# Patient Record
Sex: Male | Born: 1970 | Race: Black or African American | Hispanic: No | Marital: Married | Smoking: Never smoker
Health system: Southern US, Community
[De-identification: ages and names within clinical notes are randomized; demographics above are authoritative.]

## PROBLEM LIST (undated history)

## (undated) DIAGNOSIS — Z973 Presence of spectacles and contact lenses: Secondary | ICD-10-CM

## (undated) DIAGNOSIS — E119 Type 2 diabetes mellitus without complications: Secondary | ICD-10-CM

## (undated) DIAGNOSIS — D649 Anemia, unspecified: Secondary | ICD-10-CM

## (undated) DIAGNOSIS — N189 Chronic kidney disease, unspecified: Secondary | ICD-10-CM

## (undated) DIAGNOSIS — J189 Pneumonia, unspecified organism: Secondary | ICD-10-CM

## (undated) DIAGNOSIS — Z87442 Personal history of urinary calculi: Secondary | ICD-10-CM

## (undated) DIAGNOSIS — E785 Hyperlipidemia, unspecified: Secondary | ICD-10-CM

## (undated) DIAGNOSIS — R079 Chest pain, unspecified: Secondary | ICD-10-CM

## (undated) DIAGNOSIS — U071 COVID-19: Secondary | ICD-10-CM

## (undated) DIAGNOSIS — I10 Essential (primary) hypertension: Secondary | ICD-10-CM

## (undated) DIAGNOSIS — J45909 Unspecified asthma, uncomplicated: Secondary | ICD-10-CM

## (undated) HISTORY — PX: MULTIPLE TOOTH EXTRACTIONS: SHX2053

## (undated) HISTORY — PX: FOOT SURGERY: SHX648

## (undated) HISTORY — DX: Essential (primary) hypertension: I10

## (undated) HISTORY — DX: Chronic kidney disease, unspecified: N18.9

## (undated) HISTORY — PX: COLONOSCOPY W/ BIOPSIES AND POLYPECTOMY: SHX1376

## (undated) HISTORY — DX: Type 2 diabetes mellitus without complications: E11.9

## (undated) HISTORY — DX: Hyperlipidemia, unspecified: E78.5

## (undated) HISTORY — PX: EYE SURGERY: SHX253

## (undated) HISTORY — DX: Chest pain, unspecified: R07.9

---

## 1999-03-12 ENCOUNTER — Encounter: Admission: RE | Admit: 1999-03-12 | Discharge: 1999-06-10 | Payer: Self-pay | Admitting: Unknown Physician Specialty

## 2002-12-03 ENCOUNTER — Ambulatory Visit (HOSPITAL_BASED_OUTPATIENT_CLINIC_OR_DEPARTMENT_OTHER): Admission: RE | Admit: 2002-12-03 | Discharge: 2002-12-03 | Payer: Self-pay | Admitting: Urology

## 2002-12-03 ENCOUNTER — Encounter (INDEPENDENT_AMBULATORY_CARE_PROVIDER_SITE_OTHER): Payer: Self-pay | Admitting: Specialist

## 2004-08-15 ENCOUNTER — Emergency Department (HOSPITAL_COMMUNITY): Admission: EM | Admit: 2004-08-15 | Discharge: 2004-08-15 | Payer: Self-pay | Admitting: Emergency Medicine

## 2007-07-03 ENCOUNTER — Emergency Department (HOSPITAL_COMMUNITY): Admission: EM | Admit: 2007-07-03 | Discharge: 2007-07-03 | Payer: Self-pay | Admitting: Emergency Medicine

## 2008-05-08 ENCOUNTER — Ambulatory Visit: Payer: Self-pay | Admitting: Family Medicine

## 2008-05-13 ENCOUNTER — Ambulatory Visit: Payer: Self-pay | Admitting: *Deleted

## 2008-06-27 ENCOUNTER — Ambulatory Visit: Payer: Self-pay | Admitting: Family Medicine

## 2008-06-27 LAB — CONVERTED CEMR LAB
AST: 28 units/L (ref 0–37)
Alkaline Phosphatase: 55 units/L (ref 39–117)
CO2: 26 meq/L (ref 19–32)
Calcium: 9.8 mg/dL (ref 8.4–10.5)
Chloride: 102 meq/L (ref 96–112)
Cholesterol: 188 mg/dL (ref 0–200)
Creatinine, Ser: 1.05 mg/dL (ref 0.40–1.50)
Glucose, Bld: 93 mg/dL (ref 70–99)
HCT: 43 % (ref 39.0–52.0)
Hemoglobin: 14.3 g/dL (ref 13.0–17.0)
LDL Cholesterol: 123 mg/dL — ABNORMAL HIGH (ref 0–99)
Lymphs Abs: 3.1 10*3/uL (ref 0.7–4.0)
MCV: 74.4 fL — ABNORMAL LOW (ref 78.0–100.0)
Microalb, Ur: 10.3 mg/dL — ABNORMAL HIGH (ref 0.00–1.89)
Monocytes Absolute: 0.9 10*3/uL (ref 0.1–1.0)
Neutrophils Relative %: 41 % — ABNORMAL LOW (ref 43–77)
RBC: 5.78 M/uL (ref 4.22–5.81)
RDW: 13.8 % (ref 11.5–15.5)
Total Protein: 8.1 g/dL (ref 6.0–8.3)
Triglycerides: 88 mg/dL (ref <150)
VLDL: 18 mg/dL (ref 0–40)
WBC: 6.8 10*3/uL (ref 4.0–10.5)

## 2008-07-03 ENCOUNTER — Ambulatory Visit: Payer: Self-pay | Admitting: Internal Medicine

## 2008-07-19 ENCOUNTER — Encounter: Payer: Self-pay | Admitting: Family Medicine

## 2008-07-19 LAB — CONVERTED CEMR LAB
Collection Interval-CRCL: 24 hr
Creatinine, Urine: 159.8 mg/dL

## 2009-02-22 ENCOUNTER — Emergency Department (HOSPITAL_COMMUNITY): Admission: EM | Admit: 2009-02-22 | Discharge: 2009-02-22 | Payer: Self-pay | Admitting: Emergency Medicine

## 2009-03-05 ENCOUNTER — Emergency Department (HOSPITAL_COMMUNITY): Admission: EM | Admit: 2009-03-05 | Discharge: 2009-03-05 | Payer: Self-pay | Admitting: Family Medicine

## 2009-10-31 ENCOUNTER — Emergency Department (HOSPITAL_COMMUNITY): Admission: EM | Admit: 2009-10-31 | Discharge: 2009-10-31 | Payer: Self-pay | Admitting: Emergency Medicine

## 2009-11-03 ENCOUNTER — Emergency Department (HOSPITAL_COMMUNITY): Admission: EM | Admit: 2009-11-03 | Discharge: 2009-11-03 | Payer: Self-pay | Admitting: Family Medicine

## 2010-02-05 ENCOUNTER — Emergency Department (HOSPITAL_COMMUNITY): Admission: EM | Admit: 2010-02-05 | Discharge: 2010-02-05 | Payer: Self-pay | Admitting: Emergency Medicine

## 2010-11-02 ENCOUNTER — Other Ambulatory Visit: Payer: Self-pay | Admitting: Podiatry

## 2010-11-02 DIAGNOSIS — M79673 Pain in unspecified foot: Secondary | ICD-10-CM

## 2010-11-02 DIAGNOSIS — M25579 Pain in unspecified ankle and joints of unspecified foot: Secondary | ICD-10-CM

## 2010-11-06 ENCOUNTER — Inpatient Hospital Stay: Admission: RE | Admit: 2010-11-06 | Payer: Self-pay | Source: Ambulatory Visit

## 2010-11-06 ENCOUNTER — Ambulatory Visit
Admission: RE | Admit: 2010-11-06 | Discharge: 2010-11-06 | Disposition: A | Discharge status: Home / Self Care | Payer: No Typology Code available for payment source | Source: Ambulatory Visit | Attending: Podiatry | Admitting: Podiatry

## 2010-11-06 DIAGNOSIS — M25579 Pain in unspecified ankle and joints of unspecified foot: Secondary | ICD-10-CM

## 2010-11-06 DIAGNOSIS — M79673 Pain in unspecified foot: Secondary | ICD-10-CM

## 2011-02-05 NOTE — Op Note (Signed)
   NAMEROSEVELT, FLATLEY                         ACCOUNT NO.:  [id number]   MEDICAL RECORD NO.:  [id number]                   PATIENT TYPE:  AMB   LOCATION:  [facility]                                 FACILITY:  [facility]   PHYSICIAN:  Lillette Boxer. Dahlstedt, M.D.          DATE OF BIRTH:  02-08-1971   DATE OF PROCEDURE:  12/03/2002  DATE OF DISCHARGE:                                 OPERATIVE REPORT   PREOPERATIVE DIAGNOSIS:  Phimosis.   POSTOPERATIVE DIAGNOSIS:  Phimosis.   PRINCIPAL PROCEDURE:  Circumcision.   SURGEON:  Lillette Boxer. Dahlstedt, M.D.   ANESTHESIA:  Local with MAC.   COMPLICATIONS:  None.   BRIEF HISTORY:  A 40 year old male with recurrent phimosis and balanitis.  He presents at this time for circumcision, being aware of the alternatives  as well as risks and complications.  He desires to proceed.   DESCRIPTION OF PROCEDURE:  The patient was administered monitored anesthesia  care, and his genitalia and perineum were prepped and draped.  A dorsal  penile block was performed with 50/50 solution of 1% plain lidocaine and  0.5% plain Marcaine.  Circumcising incisions were made in the proximal and  distal foreskin after appropriate marking was performed.  The foreskin was  excised.  Small bleeders were coagulated with electrocautery, especially a  small artery at the frenulum.  A U-stitch was placed at the frenulum  approximating the distal penile skin.  Quadrant sutures were then placed  using the same 3-0 chromic.  Running simple suture of the 3-0 chromic was  placed in between all quadrant sutures.  Hemostasis was excellent.  Dressings included Vaseline gauze, Kerlix, and Coban.  The procedure was  terminated.  The patient was taken to the PACU in stable condition.                                               Lillette Boxer. Dahlstedt, M.D.    SMD/MEDQ  D:  12/03/2002  T:  12/03/2002  Job:  [id number]   cc:   Bernerd Limbo, M.D.  [place]  [place] [address]  Fax:  [phone]

## 2011-06-02 ENCOUNTER — Inpatient Hospital Stay (INDEPENDENT_AMBULATORY_CARE_PROVIDER_SITE_OTHER)
Admission: RE | Admit: 2011-06-02 | Discharge: 2011-06-02 | Disposition: A | Discharge status: Home / Self Care | Payer: Self-pay | Source: Ambulatory Visit | Attending: Family Medicine | Admitting: Family Medicine

## 2011-06-02 DIAGNOSIS — S61209A Unspecified open wound of unspecified finger without damage to nail, initial encounter: Secondary | ICD-10-CM

## 2011-06-02 DIAGNOSIS — I10 Essential (primary) hypertension: Secondary | ICD-10-CM

## 2011-06-02 LAB — POCT I-STAT, CHEM 8
Calcium, Ion: 1.21 mmol/L (ref 1.12–1.32)
Creatinine, Ser: 1.3 mg/dL (ref 0.50–1.35)
Glucose, Bld: 109 mg/dL — ABNORMAL HIGH (ref 70–99)
HCT: 44 % (ref 39.0–52.0)
Sodium: 142 mEq/L (ref 135–145)

## 2011-06-07 ENCOUNTER — Inpatient Hospital Stay (HOSPITAL_COMMUNITY)
Admission: RE | Admit: 2011-06-07 | Discharge: 2011-06-07 | Disposition: A | Discharge status: Home / Self Care | Payer: Self-pay | Source: Ambulatory Visit | Attending: Family Medicine | Admitting: Family Medicine

## 2011-06-14 ENCOUNTER — Inpatient Hospital Stay (INDEPENDENT_AMBULATORY_CARE_PROVIDER_SITE_OTHER)
Admission: RE | Admit: 2011-06-14 | Discharge: 2011-06-14 | Disposition: A | Discharge status: Home / Self Care | Payer: Self-pay | Source: Ambulatory Visit | Attending: Family Medicine | Admitting: Family Medicine

## 2011-06-14 DIAGNOSIS — Z4802 Encounter for removal of sutures: Secondary | ICD-10-CM

## 2011-06-14 DIAGNOSIS — I10 Essential (primary) hypertension: Secondary | ICD-10-CM

## 2011-07-01 LAB — DIFFERENTIAL
Basophils Absolute: 0.1
Eosinophils Relative: 3
Lymphocytes Relative: 34
Lymphs Abs: 2.6
Monocytes Relative: 10
Neutrophils Relative %: 52

## 2011-07-01 LAB — POCT CARDIAC MARKERS
CKMB, poc: 5.1
Myoglobin, poc: 364
Operator id: 294511
Operator id: 294511
Troponin i, poc: <0.05

## 2011-07-01 LAB — I-STAT 8, (EC8 V) (CONVERTED LAB)
BUN: 13
Chloride: 99
HCT: 43
Potassium: 3.5
Sodium: 135
TCO2: 29
pH, Ven: 7.375 — ABNORMAL HIGH

## 2011-07-01 LAB — CBC
MCHC: 33.8
Platelets: 252
RBC: 5.08

## 2011-07-01 LAB — POCT I-STAT CREATININE: Operator id: 294511

## 2013-11-07 ENCOUNTER — Ambulatory Visit (INDEPENDENT_AMBULATORY_CARE_PROVIDER_SITE_OTHER): Payer: BC Managed Care – PPO | Admitting: Cardiology

## 2013-11-07 ENCOUNTER — Encounter: Payer: Self-pay | Admitting: *Deleted

## 2013-11-07 VITALS — BP 206/110 | HR 72 | Ht 70.0 in | Wt 302.8 lb

## 2013-11-07 DIAGNOSIS — R079 Chest pain, unspecified: Secondary | ICD-10-CM | POA: Insufficient documentation

## 2013-11-07 DIAGNOSIS — E119 Type 2 diabetes mellitus without complications: Secondary | ICD-10-CM | POA: Insufficient documentation

## 2013-11-07 DIAGNOSIS — I10 Essential (primary) hypertension: Secondary | ICD-10-CM | POA: Insufficient documentation

## 2013-11-07 NOTE — Progress Notes (Signed)
HPI  the patient presents for evaluation of chest discomfort and difficult to control hypertension. He has no prior cardiac history. However, he has significant untreated risk factors. He recently obtained insurance and is now following up for treatment of diabetes, hyperlipidemia and hypertension. He was recently started on the medications as listed below. He does report some chest discomfort. This was happening for about a week a few days ago. He points to his left upper chest. It was all day 2/10 discomfort. It did not radiate. It did not fluctuate and was not with activities. There were no associated symptoms. He was somewhat dull. It finally just went away on its own. He was not exercising during this time but he typically exercises significantly without bringing on any of the symptoms. He has had difficult to control hypertension. His hemoglobin A1c was just checked and was greater than 10. He had been treated for these issues in the past but because of insurance problems came off of his medicines.  No Known Allergies  Current Outpatient Prescriptions  Medication Sig Dispense Refill  . atorvastatin (LIPITOR) 20 MG tablet Take 20 mg by mouth daily.      . metFORMIN (GLUCOPHAGE) 500 MG tablet Take by mouth 2 (two) times daily with a meal.      . Olmesartan-Amlodipine-HCTZ (TRIBENZOR) 40-10-12.5 MG TABS Take by mouth.       No current facility-administered medications for this visit.    Past Medical History  Diagnosis Date  . HTN (hypertension)   . Type II or unspecified type diabetes mellitus without mention of complication, not stated as uncontrolled   . Chest pain     Past Surgical History  Procedure Laterality Date  . Foot surgery    . Circumcision, non-newborn  2004    Family History  Problem Relation Age of Onset  . Colon cancer Father   . Diabetes      family history  . Hypertension      family history    History   Social History  . Marital Status: Single   Spouse Name: N/A    Number of Children: N/A  . Years of Education: N/A   Occupational History  . Not on file.   Social History Main Topics  . Smoking status: Never Smoker   . Smokeless tobacco: Not on file  . Alcohol Use: Yes     Comment: occasionally  . Drug Use: No  . Sexual Activity: Not on file   Other Topics Concern  . Not on file   Social History Narrative  . No narrative on file    ROS:  .As stated in the HPI and negative for all other systems.  PHYSICAL EXAM BP 206/110  Pulse 72  Ht 5\' 10"  (1.778 m)  Wt 302 lb 12.8 oz (137.349 kg)  BMI 43.45 kg/m2 GENERAL:  Well appearing HEENT:  Pupils equal round and reactive, fundi not visualized, oral mucosa unremarkable NECK:  No jugular venous distention, waveform within normal limits, carotid upstroke brisk and symmetric, no bruits, no thyromegaly LYMPHATICS:  No cervical, inguinal adenopathy LUNGS:  Clear to auscultation bilaterally BACK:  No CVA tenderness CHEST:  Unremarkable HEART:  PMI not displaced or sustained,S1 and S2 within normal limits, no S3, no S4, no clicks, no rubs, no murmurs ABD:  Flat, positive bowel sounds normal in frequency in pitch, no bruits, no rebound, no guarding, no midline pulsatile mass, no hepatomegaly, no splenomegaly EXT:  2 plus pulses throughout, no edema, no  cyanosis no clubbing SKIN:  No rashes no nodules NEURO:  Cranial nerves II through XII grossly intact, motor grossly intact throughout PSYCH:  Cognitively intact, oriented to person place and time  EKG:   Sinus rhythm, rate 54 , axis within normal limits, intervals within normal limits, minimal voltage criteria for left ventricle hypertrophy with repolarization changes. 11/07/2013   ASSESSMENT AND PLAN  HTN:  He was just started on Tribenzor which I think is an excellent choice.  I would not make another change at this time.  However, I thinks that he will need probably another one or two medications.  I might suggest  spironolactone as the next add on.  I did discuss diet (salt restriction) and weight loss as therapeutic lifestyle changes.  I do not strongly suspect a secondary etiology.  However, I will check renal duplex.  I would be happy to consult with Dr. Moreen Fowler as he continues to titrate the meds.    CHEST PAIN:  This is atypical.  Given  His risk factors I would eventually perform an exercise treadmill test although I think the pretest probability of obstructive coronary disease is low.  However, I would not want to do this until his BP is better controlled.   DYSLIPIDEMIA:  I agree with the Lipitor which was started.

## 2013-11-07 NOTE — Patient Instructions (Signed)
The current medical regimen is effective;  continue present plan and medications.  Your physician has requested that you have a renal artery duplex. During this test, an ultrasound is used to evaluate blood flow to the kidneys. Allow one hour for this exam. Do not eat after midnight the day before and avoid carbonated beverages. Take your medications as you usually do.  Follow up in 3 months with Dr Minus Breeding.

## 2013-11-19 ENCOUNTER — Ambulatory Visit
Admission: RE | Admit: 2013-11-19 | Discharge: 2013-11-19 | Disposition: A | Discharge status: Home / Self Care | Payer: BC Managed Care – PPO | Source: Ambulatory Visit | Attending: Family Medicine | Admitting: Family Medicine

## 2013-11-19 ENCOUNTER — Other Ambulatory Visit: Payer: Self-pay | Admitting: Family Medicine

## 2013-11-19 DIAGNOSIS — J069 Acute upper respiratory infection, unspecified: Secondary | ICD-10-CM

## 2013-12-03 ENCOUNTER — Encounter (HOSPITAL_COMMUNITY): Payer: BC Managed Care – PPO

## 2013-12-04 ENCOUNTER — Encounter (HOSPITAL_COMMUNITY): Payer: Self-pay | Admitting: Cardiology

## 2014-01-29 ENCOUNTER — Encounter: Payer: Self-pay | Admitting: *Deleted

## 2014-02-04 ENCOUNTER — Ambulatory Visit: Payer: BC Managed Care – PPO | Admitting: Cardiology

## 2014-03-18 ENCOUNTER — Other Ambulatory Visit: Payer: Self-pay | Admitting: Gastroenterology

## 2015-01-20 ENCOUNTER — Encounter (INDEPENDENT_AMBULATORY_CARE_PROVIDER_SITE_OTHER): Payer: BLUE CROSS/BLUE SHIELD | Admitting: Ophthalmology

## 2015-01-20 DIAGNOSIS — H35033 Hypertensive retinopathy, bilateral: Secondary | ICD-10-CM

## 2015-01-20 DIAGNOSIS — H2513 Age-related nuclear cataract, bilateral: Secondary | ICD-10-CM | POA: Diagnosis not present

## 2015-01-20 DIAGNOSIS — E10319 Type 1 diabetes mellitus with unspecified diabetic retinopathy without macular edema: Secondary | ICD-10-CM | POA: Diagnosis not present

## 2015-01-20 DIAGNOSIS — E10359 Type 1 diabetes mellitus with proliferative diabetic retinopathy without macular edema: Secondary | ICD-10-CM | POA: Diagnosis not present

## 2015-01-20 DIAGNOSIS — H43813 Vitreous degeneration, bilateral: Secondary | ICD-10-CM | POA: Diagnosis not present

## 2015-01-20 DIAGNOSIS — H4312 Vitreous hemorrhage, left eye: Secondary | ICD-10-CM | POA: Diagnosis not present

## 2015-01-20 DIAGNOSIS — I10 Essential (primary) hypertension: Secondary | ICD-10-CM | POA: Diagnosis not present

## 2015-02-26 ENCOUNTER — Encounter (INDEPENDENT_AMBULATORY_CARE_PROVIDER_SITE_OTHER): Payer: BLUE CROSS/BLUE SHIELD | Admitting: Ophthalmology

## 2015-03-03 ENCOUNTER — Encounter (INDEPENDENT_AMBULATORY_CARE_PROVIDER_SITE_OTHER): Payer: BLUE CROSS/BLUE SHIELD | Admitting: Ophthalmology

## 2015-03-03 DIAGNOSIS — H4311 Vitreous hemorrhage, right eye: Secondary | ICD-10-CM

## 2015-07-07 ENCOUNTER — Ambulatory Visit (INDEPENDENT_AMBULATORY_CARE_PROVIDER_SITE_OTHER): Payer: BLUE CROSS/BLUE SHIELD | Admitting: Ophthalmology

## 2015-07-07 DIAGNOSIS — E103591 Type 1 diabetes mellitus with proliferative diabetic retinopathy without macular edema, right eye: Secondary | ICD-10-CM

## 2015-07-07 DIAGNOSIS — I10 Essential (primary) hypertension: Secondary | ICD-10-CM

## 2015-07-07 DIAGNOSIS — H2513 Age-related nuclear cataract, bilateral: Secondary | ICD-10-CM | POA: Diagnosis not present

## 2015-07-07 DIAGNOSIS — H35033 Hypertensive retinopathy, bilateral: Secondary | ICD-10-CM | POA: Diagnosis not present

## 2015-07-07 DIAGNOSIS — E10311 Type 1 diabetes mellitus with unspecified diabetic retinopathy with macular edema: Secondary | ICD-10-CM | POA: Diagnosis not present

## 2015-07-07 DIAGNOSIS — H4312 Vitreous hemorrhage, left eye: Secondary | ICD-10-CM

## 2015-07-07 DIAGNOSIS — E103512 Type 1 diabetes mellitus with proliferative diabetic retinopathy with macular edema, left eye: Secondary | ICD-10-CM

## 2015-07-07 NOTE — H&P (Signed)
Ray Bradley is an 44 y.o. male.   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 HTN (hypertension)   . Type II or unspecified type diabetes mellitus without mention of complication, not stated as uncontrolled   . Chest pain   . Hyperlipidemia     Past Surgical History  Procedure Laterality Date  . Foot surgery    . Circumcision, non-newborn  2004    Family History  Problem Relation Age of Onset  . Colon cancer Father   . Diabetes      family history  . Hypertension      family history  . Aneurysm Mother     died age 30, brain  . Hypertension Mother   . Hypertension Sister 41  . Hypertension Sister 48  . Hypertension Brother 65   Social History:  reports that he has never smoked. He does not have any smokeless tobacco history on file. He reports that he drinks alcohol. He reports that he does not use illicit drugs.  Allergies: No Known Allergies  No prescriptions prior to admission    Review of systems otherwise negative  There were no vitals taken for this visit.  Physical exam: Mental status: oriented x3. Eyes: See eye exam associated with this date of surgery in media tab.  Scanned in by scanning center Ears, Nose, Throat: within normal limits Neck: Within Normal limits General: within normal limits Chest: Within normal limits Breast: deferred Heart: Within normal limits Abdomen: Within normal limits GU: deferred Extremities: within normal limits Skin: within normal limits  Assessment/Plan Vitreous hemorrhage, proliferative diabetic retinopathy left eye Plan: To [facility] for Pars plana vitrectomy, laser, gas injection left eye.  Hayden Pedro 07/07/2015, 5:56 PM

## 2015-07-15 ENCOUNTER — Ambulatory Visit (HOSPITAL_COMMUNITY): Admission: RE | Admit: 2015-07-15 | Payer: Self-pay | Source: Ambulatory Visit | Admitting: Ophthalmology

## 2015-07-15 ENCOUNTER — Encounter (HOSPITAL_COMMUNITY): Admission: RE | Payer: Self-pay | Source: Ambulatory Visit

## 2015-07-15 SURGERY — PARS PLANA VITRECTOMY WITH 25 GAUGE
Anesthesia: General | Laterality: Left

## 2015-07-22 ENCOUNTER — Encounter (INDEPENDENT_AMBULATORY_CARE_PROVIDER_SITE_OTHER): Payer: BLUE CROSS/BLUE SHIELD | Admitting: Ophthalmology

## 2015-12-26 ENCOUNTER — Ambulatory Visit: Payer: BLUE CROSS/BLUE SHIELD | Admitting: Podiatry

## 2016-01-09 ENCOUNTER — Encounter: Payer: Self-pay | Admitting: Podiatry

## 2016-01-09 ENCOUNTER — Ambulatory Visit (INDEPENDENT_AMBULATORY_CARE_PROVIDER_SITE_OTHER): Payer: BLUE CROSS/BLUE SHIELD

## 2016-01-09 ENCOUNTER — Ambulatory Visit (INDEPENDENT_AMBULATORY_CARE_PROVIDER_SITE_OTHER): Payer: BLUE CROSS/BLUE SHIELD | Admitting: Podiatry

## 2016-01-09 DIAGNOSIS — B078 Other viral warts: Secondary | ICD-10-CM

## 2016-01-09 DIAGNOSIS — B079 Viral wart, unspecified: Secondary | ICD-10-CM

## 2016-01-09 DIAGNOSIS — M79672 Pain in left foot: Secondary | ICD-10-CM

## 2016-01-09 DIAGNOSIS — E119 Type 2 diabetes mellitus without complications: Secondary | ICD-10-CM

## 2016-01-09 DIAGNOSIS — Z0189 Encounter for other specified special examinations: Secondary | ICD-10-CM

## 2016-01-09 NOTE — Progress Notes (Signed)
   Subjective:    Patient ID: Ray Bradley, male    DOB: 06-11-1971, 45 y.o.   MRN: [id number]  HPI  N-THICK SKIN L-LT FOOT GREAT TOE D-2 MONTHS O-SLOWLY C-WORSE A-PRESSURE T- TRIM  Review of Systems  Skin: Positive for color change.       Objective:   Physical Exam        Assessment & Plan:

## 2016-01-11 NOTE — Progress Notes (Signed)
Subjective:     Patient ID: Ray Bradley, male   DOB: [birth year], 45 y.o.   MRN: [id number]  HPI patient presents stating he has a lesion on the side of his left big toe which is occurred over the last couple months and it's been getting gradually worse and painful when he walks on it or wear shoes gear   Review of Systems  All other systems reviewed and are negative.      Objective:   Physical Exam  Constitutional: He is oriented to person, place, and time.  Cardiovascular: Intact distal pulses.   Musculoskeletal: Normal range of motion.  Neurological: He is oriented to person, place, and time.  Skin: Skin is warm and dry.  Nursing note and vitals reviewed.  neurovascular status was found to be intact muscle strength was adequate range of motion within normal limits with patient having mild diminishment of sharp Dole vibratory. On the left hallux medial side upon debridement there is a keratotic lesion that is defined with small pinpoint black dot and pain to lateral pressure. It is localized to this area and there is keratotic tissue surrounding this     Assessment:     Probable verruca plantaris of the left hallux medial side that is localized in nature    Plan:     H&P and condition reviewed with patient. I do believe this is most likely verruca and I discussed chemical versus excision and we are going to try chemical. I debrided the area fully and then applied a chemical agent to create a immune response and applied sterile dressing. I gave instructions on soaks and what to do if any blistering were to occur and reappoint again in 1 month or if any issues should occur

## 2016-09-30 ENCOUNTER — Encounter: Payer: Self-pay | Admitting: Podiatry

## 2016-09-30 ENCOUNTER — Ambulatory Visit (INDEPENDENT_AMBULATORY_CARE_PROVIDER_SITE_OTHER): Payer: Self-pay | Admitting: Podiatry

## 2016-09-30 ENCOUNTER — Ambulatory Visit (INDEPENDENT_AMBULATORY_CARE_PROVIDER_SITE_OTHER): Payer: Self-pay

## 2016-09-30 VITALS — BP 160/95 | HR 73 | Resp 16

## 2016-09-30 DIAGNOSIS — L03119 Cellulitis of unspecified part of limb: Secondary | ICD-10-CM

## 2016-09-30 DIAGNOSIS — S99921A Unspecified injury of right foot, initial encounter: Secondary | ICD-10-CM

## 2016-09-30 DIAGNOSIS — L02619 Cutaneous abscess of unspecified foot: Secondary | ICD-10-CM

## 2016-09-30 LAB — CBC WITH DIFFERENTIAL/PLATELET
BASOS PCT: 0 %
Basophils Absolute: 0 cells/uL (ref 0–200)
Eosinophils Absolute: 225 cells/uL (ref 15–500)
Eosinophils Relative: 3 %
HEMATOCRIT: 37 % — AB (ref 38.5–50.0)
HEMOGLOBIN: 12.7 g/dL — AB (ref 13.2–17.1)
LYMPHS ABS: 2475 {cells}/uL (ref 850–3900)
LYMPHS PCT: 33 %
MCH: 26.2 pg — ABNORMAL LOW (ref 27.0–33.0)
MCHC: 34.3 g/dL (ref 32.0–36.0)
MCV: 76.4 fL — ABNORMAL LOW (ref 80.0–100.0)
MONO ABS: 750 {cells}/uL (ref 200–950)
MPV: 9.7 fL (ref 7.5–12.5)
Monocytes Relative: 10 %
NEUTROS ABS: 4050 {cells}/uL (ref 1500–7800)
Neutrophils Relative %: 54 %
Platelets: 311 10*3/uL (ref 140–400)
RBC: 4.84 MIL/uL (ref 4.20–5.80)
RDW: 14.1 % (ref 11.0–15.0)
WBC: 7.5 10*3/uL (ref 3.8–10.8)

## 2016-09-30 MED ORDER — AMOXICILLIN-POT CLAVULANATE 875-125 MG PO TABS: 1.0000 | ORAL_TABLET | Freq: Two times a day (BID) | ORAL | 0 refills | Status: DC

## 2016-10-01 LAB — SEDIMENTATION RATE: Sed Rate: 34 mm/hr — ABNORMAL HIGH (ref 0–15)

## 2016-10-03 LAB — WOUND CULTURE
GRAM STAIN: NONE SEEN
Gram Stain: NONE SEEN
Gram Stain: NONE SEEN
Organism ID, Bacteria: NO GROWTH

## 2016-10-03 NOTE — Progress Notes (Signed)
Subjective:     Patient ID: Ray Bradley, male   DOB: [birth year], 46 y.o.   MRN: [id number]  HPI patient states he stepped on a nail proximally 6 days ago and walked on it all day before realizing it and pulling it out at the end of the day. It did penetrate his work boot even though he says to a very small amount and there was no significant bleeding when the event occurred   Review of Systems  All other systems reviewed and are negative.      Objective:   Physical Exam  Constitutional: He is oriented to person, place, and time.  Cardiovascular: Intact distal pulses.   Musculoskeletal: Normal range of motion.  Neurological: He is oriented to person, place, and time.  Skin: Skin is warm.  Nursing note and vitals reviewed.  neurovascular status was found to be intact with mild diminishment of sharp dull vibratory. Patient does have long-term diabetes that's under good control stating that when checked this morning his number was 110. He's had no fever chills or systemic signs of infection when questioned and on the plantar aspect of the right foot there is a small opening measuring approximately 3 x 3 mm with no odor emitting from it and no active drainage. Patient has no proximal edema erythema drainage at this current time and is taking Cipro which was dispensed to him at the urgent care where he was first evaluated     Assessment:     Puncture wound right with possibility for deep infection with no current indications of systemic infection    Plan:     H&P condition reviewed with patient at great length. At this point I went ahead and utilizing sterile instrumentation I did debride the area and I did not note active drainage. I did do a probe with a culture and was able to probe several millimeters with no indication of deep fascia or tendon bone involvement and I did culture this and send it off to pathology. I then applied dressing to the area with padding to relieve any  weightbearing pressure and dispensed surgical shoe and instructed on soaks and added Augmentin 875 twice a day for antibiotic. I gave him strict instructions that if any changes should occur or any systemic indications of infection were to occur he is to contact our office immediately and go to the emergency room. I did discuss with him with these types of infections there is a possibility for a deeper infection and that this could eventually require hospitalization and other treatments. Patient is scheduled to be seen back in 1 week or earlier if needed  X-ray report is not indicate signs currently of bone infection or soft tissue abscess

## 2016-10-07 ENCOUNTER — Ambulatory Visit: Payer: Self-pay | Admitting: Podiatry

## 2016-10-19 ENCOUNTER — Ambulatory Visit (INDEPENDENT_AMBULATORY_CARE_PROVIDER_SITE_OTHER): Payer: Self-pay | Admitting: Podiatry

## 2016-10-19 ENCOUNTER — Encounter: Payer: Self-pay | Admitting: Podiatry

## 2016-10-19 ENCOUNTER — Telehealth: Payer: Self-pay | Admitting: *Deleted

## 2016-10-19 DIAGNOSIS — L02619 Cutaneous abscess of unspecified foot: Secondary | ICD-10-CM

## 2016-10-19 DIAGNOSIS — L03119 Cellulitis of unspecified part of limb: Secondary | ICD-10-CM

## 2016-10-19 DIAGNOSIS — T148XXA Other injury of unspecified body region, initial encounter: Secondary | ICD-10-CM

## 2016-10-19 MED ORDER — MUPIROCIN 2 % EX OINT: TOPICAL_OINTMENT | CUTANEOUS | 2 refills | Status: DC

## 2016-10-19 MED ORDER — DOXYCYCLINE HYCLATE 100 MG PO TABS: 100.0000 mg | ORAL_TABLET | Freq: Two times a day (BID) | ORAL | 0 refills | Status: DC

## 2016-10-19 NOTE — Telephone Encounter (Addendum)
-----   Message from [place] sent at 10/19/2016  8:49 AM EST ----- Regarding: MRI Orders are in! Thanks! Orders faxed to [facility] and given to D. Meadows for [employer].

## 2016-10-19 NOTE — Progress Notes (Signed)
He presents today for follow-up of a puncture wound to the right plantar forefoot. He states the wound looks the same he's completed the Augmentin and Cipro he states the foot does not hurt that the big toe still won't bend down. He states that he is concerned because the big toe appears to be sitting up.  Objective: Vital signs are stable alert and oriented 3 CBC and a culture and sensitivity were performed last time on the wound which demonstrates no fluoroscopy and no signs of infection. The wound appears to be intact today measuring 3 cm in total diameter of macerated tissue and I've millimeters of actual open wound that does probe deep toward bone. I reviewed the radiographs previously not visualizing any signs of osteomyelitis. No foreign body. I debrided the area thoroughly today to bleeding no signs of purulence or abnormal tissue growth.  Assessment: Puncture wound not rule out an abscess since the wound is not healing faster than it is also concerned about a plantar flexor tear of the flexor hallucis longus.  Plan: Debrided the area today redressed with addressed compressive dressing recommended he start addressing this daily I wrote a prescription for doxycycline and Bactroban ointment. I also requesting an MRI to evaluate for deep abscess osteomyelitis and flexor hallucis tear.

## 2016-10-25 ENCOUNTER — Encounter: Payer: Self-pay | Admitting: Podiatry

## 2016-10-25 ENCOUNTER — Ambulatory Visit
Admission: RE | Admit: 2016-10-25 | Discharge: 2016-10-25 | Disposition: A | Discharge status: Home / Self Care | Payer: Self-pay | Source: Ambulatory Visit | Attending: Podiatry | Admitting: Podiatry

## 2016-10-25 DIAGNOSIS — L03119 Cellulitis of unspecified part of limb: Principal | ICD-10-CM

## 2016-10-25 DIAGNOSIS — T148XXA Other injury of unspecified body region, initial encounter: Secondary | ICD-10-CM

## 2016-10-25 DIAGNOSIS — L02619 Cutaneous abscess of unspecified foot: Secondary | ICD-10-CM

## 2016-11-02 ENCOUNTER — Ambulatory Visit (INDEPENDENT_AMBULATORY_CARE_PROVIDER_SITE_OTHER): Payer: Self-pay | Admitting: Podiatry

## 2016-11-02 ENCOUNTER — Encounter: Payer: Self-pay | Admitting: Podiatry

## 2016-11-02 VITALS — BP 162/88 | HR 60 | Resp 16

## 2016-11-02 DIAGNOSIS — T148XXA Other injury of unspecified body region, initial encounter: Secondary | ICD-10-CM

## 2016-11-02 NOTE — Progress Notes (Signed)
He presents today for follow-up of his MRI and his superficial ulceration to the plantar aspect of the right foot. He states that he's doing quite well he denies fever chills nausea vomiting muscle aches and pains. States that he really hasn't noticed that his toe doesn't plantarflex her well. Has finished up his antibiotics denies any worsening of the condition denies any drainage. He continues to walk or work and exercise at the gym states that he cannot be off of his foot.  Objective: Vital signs are stable alert and oriented 3. Pulses are palpable. Neurologic sensory was intact. Deep tendon reflexes are intact. Superficial ulceration measuring approximately 2 mm prior to debridement measures about 5 mm post debridement which does not probe deep to subcutaneous tissue and only probes to normal tissue. His MRI is positive for complete tear with 3 cm retraction of the FHL right foot.  Assessment: Puncture wound healing very nicely plantar first metatarsophalangeal joint right foot. FHL tear right foot.  Plan: Discussed etiology pathology conservative versus surgical therapies. Since there is no infection visible and none seen on MRI this point I did not put him on antibiotics. I did start him on Iodosorb gel with a sample tube. He will continue to dress the wound daily. We did discuss the pros and cons of FHL tendon repair I expressed to him that very rarely will these go on to a complete union and that the consequences of surgical repair such of this could be worse than the actual tear itself he understands this is amenable to it states that he cannot be out of work at this point in any way shape fashion performed. He states that he does not have time to have this fixed but he will look into whether or not he feels that it needs to be done. I recommended that he talk to another physician if necessary but he declined. I will continue to follow up with him in 2 weeks for wound evaluation.

## 2016-11-16 ENCOUNTER — Ambulatory Visit: Payer: Self-pay | Admitting: Podiatry

## 2016-11-18 ENCOUNTER — Encounter: Payer: Self-pay | Admitting: Podiatry

## 2016-11-18 ENCOUNTER — Ambulatory Visit (INDEPENDENT_AMBULATORY_CARE_PROVIDER_SITE_OTHER): Payer: Self-pay | Admitting: Podiatry

## 2016-11-18 DIAGNOSIS — T148XXA Other injury of unspecified body region, initial encounter: Secondary | ICD-10-CM

## 2016-11-18 DIAGNOSIS — L97521 Non-pressure chronic ulcer of other part of left foot limited to breakdown of skin: Secondary | ICD-10-CM

## 2016-11-18 NOTE — Progress Notes (Signed)
He presents today for follow-up of his puncture wound ulceration plantar aspect of the right foot. He states that his sugars are in good condition.  Objective: He is a new superficial ulceration to the medial aspect of the left hallux this appears to have been a blister that ruptured. He states that he thinks is from swelling in his work shoes. The ulcer to the plantar aspect of the right first metatarsophalangeal joint appears to be slightly macerated when asked him if he's been using his medication he states that [employer] to get from him one boarding a plane. So he's been using Neosporin twice a day.  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 Be clinically infected at this time MRI didn't demonstrate complete laceration of the FHL on the right foot.  Plan: Start him back on Iodosorb gel recommended he continue to dress daily and he will follow up with Dr. Earleen Newport in 1 week.

## 2016-12-02 ENCOUNTER — Ambulatory Visit (INDEPENDENT_AMBULATORY_CARE_PROVIDER_SITE_OTHER): Payer: Self-pay | Admitting: Podiatry

## 2016-12-02 ENCOUNTER — Encounter: Payer: Self-pay | Admitting: Podiatry

## 2016-12-02 DIAGNOSIS — T148XXA Other injury of unspecified body region, initial encounter: Secondary | ICD-10-CM

## 2016-12-02 DIAGNOSIS — L03119 Cellulitis of unspecified part of limb: Secondary | ICD-10-CM

## 2016-12-02 DIAGNOSIS — L02619 Cutaneous abscess of unspecified foot: Secondary | ICD-10-CM

## 2016-12-02 MED ORDER — CLINDAMYCIN HCL 300 MG PO CAPS: 300.0000 mg | ORAL_CAPSULE | Freq: Three times a day (TID) | ORAL | 2 refills | Status: DC

## 2016-12-02 NOTE — Patient Instructions (Signed)

## 2016-12-03 LAB — CBC WITH DIFFERENTIAL/PLATELET
Basophils Absolute: 0 cells/uL (ref 0–200)
Basophils Relative: 0 %
EOS ABS: 74 {cells}/uL (ref 15–500)
Eosinophils Relative: 1 %
HCT: 36 % — ABNORMAL LOW (ref 38.5–50.0)
HEMOGLOBIN: 12.2 g/dL — AB (ref 13.2–17.1)
LYMPHS ABS: 2590 {cells}/uL (ref 850–3900)
Lymphocytes Relative: 35 %
MCH: 25.8 pg — AB (ref 27.0–33.0)
MCHC: 33.9 g/dL (ref 32.0–36.0)
MCV: 76.1 fL — AB (ref 80.0–100.0)
MPV: 9.2 fL (ref 7.5–12.5)
Monocytes Absolute: 666 cells/uL (ref 200–950)
Monocytes Relative: 9 %
NEUTROS ABS: 4070 {cells}/uL (ref 1500–7800)
NEUTROS PCT: 55 %
Platelets: 277 10*3/uL (ref 140–400)
RBC: 4.73 MIL/uL (ref 4.20–5.80)
RDW: 14.2 % (ref 11.0–15.0)
WBC: 7.4 10*3/uL (ref 3.8–10.8)

## 2016-12-06 ENCOUNTER — Telehealth: Payer: Self-pay | Admitting: *Deleted

## 2016-12-06 LAB — C-REACTIVE PROTEIN: CRP: 4 mg/L (ref <8.0)

## 2016-12-06 NOTE — Telephone Encounter (Signed)
I'm calling to see if you have insurance or will you be paying for surgery self-pay?  "I am self-pay."  You are self-pay for physician, facility and anesthesia?  "Yes, I am self-pay."  I called and responded to Cynthia's, surgical center, e-mail regarding patient's insurance.  I informed her that he will be self-pay.

## 2016-12-06 NOTE — Progress Notes (Signed)
Subjective: 46 year old male presents the office today for follow-up evaluation of continued puncture wound to the right foot submetatarsal one. He previously states that the nail about 1 month ago. His tetanus is up-to-date. He states that the wound is still not healed. He presents for MRI which did reveal a complete laceration of the FHL tendon. He has noticed some mild swelling along the wound site but denies any redness or drainage or any pus. He has no significant pain. Denies any systemic complaints such as fevers, chills, nausea, vomiting. No acute changes since last appointment, and no other complaints at this time.   Objective: AAO x3, NAD DP/PT pulses palpable bilaterally, CRT less than 3 seconds Protective sensation decreased with Simms Weinstein monofilament Right foot submetatarsal one puncture wound measuring 0.2 x 0.2 x 0.5 cm. There is no probing to bone, undermining or tunneling. There is no surrounding erythema, ascending cellulitis. There is no fluctuance, crepitus, malodor.  No edema, erythema, increase in warmth to bilateral lower extremities.  No open lesions or pre-ulcerative lesions.  No pain with calf compression, swelling, warmth, erythema  Assessment: Puncture wound right foot, tendon rupture  Plan: -All treatment options discussed with the patient including all alternatives, risks, complications.  -I long discussion the patient in regards to treatment options. At this point her primary concern is patient infection is control. He states that he he's been told of the tendon likely not reveal to be. At this point and he understands and understands he may need to have further surgery. At this point I discussed with him and incision and drainage, wound debridement/excision. I discussed the surgery as well as the postoperative course. Discussed with him that he is at risk for amputation. -The incision placement as well as the postoperative course was discussed with the  patient. I discussed risks of the surgery which include, but not limited to, infection, bleeding, pain, swelling, need for further surgery, delayed or nonhealing, painful or ugly scar, numbness or sensation changes, over/under correction, recurrence, transfer lesions, further deformity, hardware failure, DVT/PE, loss of toe/foot. Patient understands these risks and wishes to proceed with surgery. The surgical consent was reviewed with the patient all 3 pages were signed. No promises or guarantees were given to the outcome of the procedure. All questions were answered to the best of my ability. Before the surgery the patient was encouraged to call the office if there is any further questions. The surgery will be performed at the [facility] on an outpatient basis. -Patient encouraged to call the office with any questions, concerns, change in symptoms.   Celesta Gentile, DPM

## 2016-12-06 NOTE — Telephone Encounter (Signed)
"  I got a message in [facility] about my upcoming surgery.  I got the times for my post-op and my suture removal but I don't have a time for my surgery with Dr. Jacqualyn Posey on Wednesday.  Can someone give me a call back?"  I'm returning your call.  "They just emailed me and told me to be there at 6:45 am.  Doristine Devoid, I was going to tell you they are not affiliated with [facility] so you will not see their information on MyChart.

## 2016-12-07 ENCOUNTER — Telehealth: Payer: Self-pay | Admitting: *Deleted

## 2016-12-07 NOTE — Telephone Encounter (Signed)
"  I am trying to reschedule surgery scheduled for tomorrow.  I have to pay for the anesthesia.  They pushed me back to Wednesday, the 28th.  Can someone give me a call back?"  I'm returning your call.  Did you talk to the surgical center about rescheduling?  "Yes, they know.  I'm kind of concerned about why he wants to put me to sleep.  I don't think it's necessary.  I can't feel anything, that's why I'm in the predicament I'm in now."  Ray Bradley in insurance said that he said if you had any money concerns he could probably do your surgery here in the office.  "Let's do that please.  I don't think I need to be put to sleep."  Let me see when he can do it and I'll give you a call tomorrow.  "That will be fine.  I am willing to do it anytime."

## 2016-12-08 NOTE — Telephone Encounter (Signed)
Whenever I have time next week. I am out tomorrow and Friday. If it gets worse he can go to the ER or see another doctor within the group.

## 2016-12-08 NOTE — Telephone Encounter (Signed)
"  I am just calling to follow up, the doctor was supposed to call me back.  We were going to reschedule the procedure to be done in the office.  If someone could give me a call back."  I'm returning your call.  Dr. Jacqualyn Posey said he can do it on Wednesday of next week.  Be here at 12N.  "Okay, I will be there."

## 2016-12-13 ENCOUNTER — Encounter: Payer: Self-pay | Admitting: Podiatry

## 2016-12-15 ENCOUNTER — Ambulatory Visit: Payer: Self-pay | Admitting: Podiatry

## 2016-12-15 ENCOUNTER — Telehealth: Payer: Self-pay | Admitting: *Deleted

## 2016-12-15 ENCOUNTER — Ambulatory Visit: Payer: Self-pay

## 2016-12-15 ENCOUNTER — Ambulatory Visit (INDEPENDENT_AMBULATORY_CARE_PROVIDER_SITE_OTHER): Payer: Self-pay | Admitting: Podiatry

## 2016-12-15 VITALS — BP 168/83 | HR 60 | Temp 98.1°F | Resp 18

## 2016-12-15 DIAGNOSIS — L03119 Cellulitis of unspecified part of limb: Secondary | ICD-10-CM

## 2016-12-15 DIAGNOSIS — T148XXA Other injury of unspecified body region, initial encounter: Secondary | ICD-10-CM

## 2016-12-15 DIAGNOSIS — L02619 Cutaneous abscess of unspecified foot: Secondary | ICD-10-CM

## 2016-12-15 MED ORDER — HYDROCODONE-ACETAMINOPHEN 5-325 MG PO TABS: 1.0000 | ORAL_TABLET | ORAL | 0 refills | Status: DC | PRN

## 2016-12-15 MED ORDER — CEPHALEXIN 500 MG PO CAPS: 500.0000 mg | ORAL_CAPSULE | Freq: Three times a day (TID) | ORAL | 2 refills | Status: DC

## 2016-12-15 NOTE — Telephone Encounter (Addendum)
-----  Message from Trula Slade, DPM sent at 12/13/2016  7:48 PM EDT ----- CBC and CRP normal. I don't see a result for ESR. Can you please call and get this result? Thanks.12/16/2016-Pt states he had a procedure and had questions about the dressing, not everything he needed was in the bag he was given. I spoke with Dr. Jacqualyn Posey, he stated that the iodoform was placed in the bag and pt needs to place that in the surgery site and cover with gauze. I informed pt and he said the iodoform was in the bag and now he knew what to do.12/22/2016-Orders called to pt and instructed to complete the antibiotic is currently taking as well. Pt states understanding.

## 2016-12-16 ENCOUNTER — Telehealth: Payer: Self-pay | Admitting: Podiatry

## 2016-12-16 NOTE — Telephone Encounter (Signed)
Val- there should be iodoform and gauze and saline. He is to flush the wound with saline and pack with iodoform packing and cover with gauze. We can order more supplies through [facility] if needed.

## 2016-12-16 NOTE — Progress Notes (Signed)
Subjective: 46 year old male presents the office today for further evaluation as well as for an incision and drainage, wound debridement for his right foot. He was previously scheduled last week to do this at the operative room however he did not do this due to cost. He states that he has not taken and a bike speed denies any systemic complaints as fevers, chills, nausea, vomiting. He denies any calf pain, chest pain, shortness of breath. He has not noticed any drainage or pus coming from the wound however he states it looks about the same as what has been previously. This all started after he stepped on a nail over one month ago which has resulted in nonhealing puncture site wound. His tetanus is up-to-date.  Objective: AAO x3, NAD DP/PT pulses palpable bilaterally, CRT less than 3 seconds Continue on the right foot so metatarsal 1 is evidence of a puncture wound. There is localized edema to this area but there is no drainage or pus there is no fluctuance or crepitus or malodor. The wound does probe close to bone. No other open lesions or pre-ulcer lesions identified today. There is no pain with calf compression, swelling, warmth, erythema.  Assessment: Puncture wound right foot with nonhealing  Plan: -At this time a discussed both conservative and surgical options the patient. At this point I recommended incision and drainage and wound debridement of the nonhealing wounds and for deep culture. I discussed with him the surgery as well as the postoperative course as well as the risks and complications and alternatives. At this time he wishes to go ahead and proceed with the procedure. Due to cost utilizing go ahead to this and the office today and avoid out any remaining anesthesia. I encouraged to do this in the operative room however he declined. -Mixture of lidocaine and Marcaine plain vertebral 3 mL was infiltrated around the surgical site. With the area was anesthetized the skin was prepped in a  sterile fashion. Next a 61 with scalpel was utilized to make an incision along the wound submetatarsal 1 circumcising around the wound to excise this wound. Incision was made with an 15 blade scalpel to the epidermis the dermis. The wound at this time was found to probe directly down very close to the bone. There is found to be significant amount of nonviable tissue and devitalized tissue within this wound which was debrided today. The area was probed and there was no tunneling however did probe down close the sesamoids. The underlying bone appeared to be hard. I was unable to identify any purulence or any abscess. The incision was cut as the irrigated with sterile saline and hemostasis was achieved. 2 single and her procedures of 3-0 nylon were placed on the incision. The central wound was packed open with iodoform packing. Dry sterile dressing was then applied. Tolerated the procedure well any complications. Also prior to irrigation a wound culture was obtained. I discussed with him daily dressing changes and I showed him how to change the dressings and I gave him dressings today. Also dispensed offloading surgical shoe with a heel.  -Monitor for any clinical signs or symptoms of infection and directed to call the office immediately should any occur or go to the ER. -RTC 1 week or sooner if needed

## 2016-12-16 NOTE — Telephone Encounter (Signed)
Pt called and said he was seen yesterday and was sent home with a bag and was expecting some supplies like the stuff you placed in wound and other things but they were not in the bag.Please call pt and advise.

## 2016-12-18 LAB — WOUND CULTURE
GRAM STAIN: NONE SEEN
Gram Stain: NONE SEEN

## 2016-12-22 MED ORDER — AMOXICILLIN 500 MG PO CAPS: 500.0000 mg | ORAL_CAPSULE | Freq: Three times a day (TID) | ORAL | 0 refills | Status: DC

## 2016-12-22 NOTE — Telephone Encounter (Signed)
-----   Message from Trula Slade, DPM sent at 12/21/2016  7:32 AM EDT ----- Please order amoxicillin 500mg  TID x 10 days

## 2016-12-23 ENCOUNTER — Ambulatory Visit (INDEPENDENT_AMBULATORY_CARE_PROVIDER_SITE_OTHER): Payer: Self-pay | Admitting: Podiatry

## 2016-12-23 ENCOUNTER — Encounter: Payer: Self-pay | Admitting: Podiatry

## 2016-12-23 VITALS — Temp 98.5°F

## 2016-12-23 DIAGNOSIS — T148XXA Other injury of unspecified body region, initial encounter: Secondary | ICD-10-CM

## 2016-12-23 MED ORDER — HYDROCODONE-ACETAMINOPHEN 5-325 MG PO TABS: 1.0000 | ORAL_TABLET | ORAL | 0 refills | Status: DC | PRN

## 2016-12-24 NOTE — Progress Notes (Signed)
Subjective: Ray Bradley is a 46 y.o. is seen today in office s/p right foot I&D, wound debridement preformed on 12/15/16. They state their pain is improved. He's been using Iodosorb to the wound daily followed by gauze. He has continue the Augmentin that we called in as well for him. Denies any systemic complaints such as fevers, chills, nausea, vomiting. No calf pain, chest pain, shortness of breath.   Objective: General: No acute distress, AAOx3  DP/PT pulses palpable 2/4, CRT < 3 sec to all digits.  Protective sensation intact. Motor function intact.  Right foot: Incision is well coapted without any evidence of dehiscence and sutures are intact to the proximal and distal portions. On the central aspect the wound is continued appropriate does not probe to bone today. Does probe approximately 1.4 cm. There is decreased edema around the area compared to last week before the procedure. There is no ascending erythema, ascending cellulitis. There is no fluctuance, crepitus, malodor. No other open lesions or pre-ulcerative lesions.  No pain with calf compression, swelling, warmth, erythema.   Assessment and Plan:  Status post Right foot I&D, doing well with no complications   -Treatment options discussed including all alternatives, risks, and complications -Finish course of antibiotics -Wound was clean today. Continue with Iodoform dressing changes daily. This was dispensed to him today. -Ice/elevation -Pain medication as needed. -Monitor for any clinical signs or symptoms of infection and DVT/PE and directed to call the office immediately should any occur or go to the ER. -Follow-up in 10 days or sooner if any problems arise. In the meantime, encouraged to call the office with any questions, concerns, change in symptoms.   Celesta Gentile, DPM

## 2017-01-03 ENCOUNTER — Ambulatory Visit (INDEPENDENT_AMBULATORY_CARE_PROVIDER_SITE_OTHER): Payer: Self-pay | Admitting: Podiatry

## 2017-01-03 ENCOUNTER — Encounter: Payer: Self-pay | Admitting: Podiatry

## 2017-01-03 DIAGNOSIS — T148XXA Other injury of unspecified body region, initial encounter: Secondary | ICD-10-CM

## 2017-01-03 DIAGNOSIS — L03119 Cellulitis of unspecified part of limb: Secondary | ICD-10-CM

## 2017-01-03 DIAGNOSIS — L02619 Cutaneous abscess of unspecified foot: Secondary | ICD-10-CM

## 2017-01-03 MED ORDER — HYDROCODONE-ACETAMINOPHEN 5-325 MG PO TABS: 1.0000 | ORAL_TABLET | ORAL | 0 refills | Status: DC | PRN

## 2017-01-05 NOTE — Progress Notes (Signed)
Subjective: Ray Bradley is a 46 y.o. is seen today in office s/p right foot I&D, wound debridement preformed on 12/15/16. He states that he gets some occasional sharp pain to the foot at times. He has continued to pack with iodoform dressing daily and covering with gauze. He flushes it with saline. He has continued in the surgical shoe with a heel. He does not sleep in the boot. Denies any systemic complaints such as fevers, chills, nausea, vomiting. No calf pain, chest pain, shortness of breath.   Objective: General: No acute distress, AAOx3  DP/PT pulses palpable 2/4, CRT < 3 sec to all digits.  Protective sensation intact. Motor function intact.  Right foot: Incision is well coapted without any evidence of dehiscence and sutures are intact to the proximal and distal portions. On the central aspect the wound does continue to probe but not to bone today and it appears to be filling in Does probe approximately 1.1 cm. There is minimal edema around the wound.  There is no ascending erythema, ascending cellulitis. There is no fluctuance, crepitus, malodor. No other open lesions or pre-ulcerative lesions.  No pain with calf compression, swelling, warmth, erythema.   Assessment and Plan:  Status post Right foot I&D, doing well with no complications   -Treatment options discussed including all alternatives, risks, and complications -Overall the wound appears to be healthy and granular.  -Continue antibiotics -Wound was clean today. Continue with Iodoform dressing changes daily.  -Ice/elevation -Pain medication as needed. Rx provided today.  -Monitor for any clinical signs or symptoms of infection and DVT/PE and directed to call the office immediately should any occur or go to the ER. -Follow-up in 10 days or sooner if any problems arise. In the meantime, encouraged to call the office with any questions, concerns, change in symptoms.   Celesta Gentile, DPM

## 2017-01-13 ENCOUNTER — Ambulatory Visit: Payer: Self-pay | Admitting: Podiatry

## 2017-01-14 ENCOUNTER — Ambulatory Visit (INDEPENDENT_AMBULATORY_CARE_PROVIDER_SITE_OTHER): Payer: Self-pay | Admitting: Podiatry

## 2017-01-14 ENCOUNTER — Encounter: Payer: Self-pay | Admitting: Podiatry

## 2017-01-14 DIAGNOSIS — L97521 Non-pressure chronic ulcer of other part of left foot limited to breakdown of skin: Secondary | ICD-10-CM

## 2017-01-17 NOTE — Progress Notes (Signed)
Subjective: Ray Bradley is a 46 y.o. is seen today in office s/p right foot I&D, wound debridement preformed on 12/15/16. He states that he is doing well and he believes of the wound is closed. He stopped putting a bandage on the wound as he states of the areas healed. He's remained in the surgical shoe. He denies any drainage or pus. Denies any increase in swelling or redness. He states the swelling is actually much improved compared to what it was. Denies any systemic complaints such as fevers, chills, nausea, vomiting. No calf pain, chest pain, shortness of breath.   Objective: General: No acute distress, AAOx3  DP/PT pulses palpable 2/4, CRT < 3 sec to all digits.  Protective sensation intact. Motor function intact.  Right foot: Incision is well coapted without any evidence of dehiscence and sutures are intact to the proximal and distal portions. Hyperkeratotic lesion overlies the wound. Upon debridement there remains a wound measuring partly 0.4 x 0.3 x 0.2 cm. The wound base is granular. There is no probing, undermining or tunneling. There is minimal edema around the area and there is no erythema or increase in warmth. There is no drainage or pus. No fluctuance, crepitus, malodor. No other open lesions or pre-ulcerative lesions.  No pain with calf compression, swelling, warmth, erythema.   Assessment and Plan:  Status post Right foot I&D, doing well with no complications   -Treatment options discussed including all alternatives, risks, and complications -Wound was sharply debrided with a scalpel today down to granular tissue. Recommended continue daily dressing changes with Iodosorb dressing daily. Recommended him to remain in the surgical shoe with a wedge to take pressure off the area ptosis completely healed. -No clinical signs of infection so we'll hold off on any further antibiotics.  -Ice/elevation -Monitor for any clinical signs or symptoms of infection and DVT/PE and directed to  call the office immediately should any occur or go to the ER. -Follow-up as scheduled or sooner if any problems arise. In the meantime, encouraged to call the office with any questions, concerns, change in symptoms.   Celesta Gentile, DPM

## 2017-01-28 ENCOUNTER — Ambulatory Visit (INDEPENDENT_AMBULATORY_CARE_PROVIDER_SITE_OTHER): Payer: Self-pay | Admitting: Podiatry

## 2017-01-28 DIAGNOSIS — L97521 Non-pressure chronic ulcer of other part of left foot limited to breakdown of skin: Secondary | ICD-10-CM

## 2017-01-31 NOTE — Progress Notes (Signed)
Subjective: Ray Bradley is a 46 y.o. is seen today in office s/p right foot I&D, wound debridement preformed on 12/15/16. He states that he is doing "great" and the area has healed. He denies any pain, drainage, swelling. He states he is ready to go back to work today. He has been wearing a regular shoe and presents in that today.  Patient states that he looks that his feet 12 noon every day. Denies any systemic complaints such as fevers, chills, nausea, vomiting. No calf pain, chest pain, shortness of breath.   Objective: General: No acute distress, AAOx3  DP/PT pulses palpable 2/4, CRT < 3 sec to all digits.  Protective sensation intact. Motor function intact.  Right foot: Incision is well coapted today. The wound hyperkeratotic tissue over the area. After debridement underlying wound appears to be healed there is no undermining or tunneling and there is no break in the skin present. There is no drainage or pus expressed there is no significant edema. There is no erythema or increase in warmth. There is no fluctuance or crepitus. No malodor. No other open lesions or pre-ulcerative lesions. No pain with calf compression, swelling, warmth, erythema.   Assessment and Plan:  Status post Right foot I&D, doing well with no complications   -Treatment options discussed including all alternatives, risks, and complications -Hyperkeratotic tissue sharply debrided to reveal the underlying wound appears to be healed. There is no clinical signs of infection. Continue to monitor this closely. -Continue daily foot inspection. -Given his diabetes and neuropathy recommend follow-up for diabetic foot evaluation of a 3-6 months. Call the office with any questions or concerns meantime.  Celesta Gentile, DPM

## 2017-06-10 ENCOUNTER — Encounter: Payer: Self-pay | Admitting: Podiatry

## 2017-06-10 ENCOUNTER — Ambulatory Visit (INDEPENDENT_AMBULATORY_CARE_PROVIDER_SITE_OTHER): Payer: Self-pay | Admitting: Podiatry

## 2017-06-10 DIAGNOSIS — S91209A Unspecified open wound of unspecified toe(s) with damage to nail, initial encounter: Secondary | ICD-10-CM

## 2017-06-13 NOTE — Progress Notes (Signed)
Subjective: Mr. Haydel presents the office today for concerns of his right big toenail which came off this morning. He was running a 5K over the weekend and his nail became loose and this morning and is round the nail came off his been bleeding. Denies a specific injury other than the running. He has no other concerns Denies any systemic complaints such as fevers, chills, nausea, vomiting. No acute changes since last appointment, and no other complaints at this time.   Objective: AAO x3, NAD DP/PT pulses palpable bilaterally, CRT less than 3 seconds No nails present to the right hallux toenail. Nail bed has one area that is small amount of active bleeding. There is no drainage. There is no edema, erythema. There is no clinical signs of infection Left foot submetatarsal one hyperkeratotic lesion. There is no underlying ulceration, drainage or any signs of infection noted today. No open lesions or pre-ulcerative lesions.  No pain with calf compression, swelling, warmth, erythema  Assessment: Traumatic nail avulsion right hallux toenail  Plan: -All treatment options discussed with the patient including all alternatives, risks, complications.  -At today's appointment the nail bed was cleaned. Hemostasis was achieved utilizing lumicaine and surgicel. Once hemostasis as achieved Silvadene was applied followed by a bandage. Post procedure instructions were discussed. Monitor for any signs or symptoms of infection. Follow-up with on healed in 2 weeks or sooner if any issues are to arise. -Patient encouraged to call the office with any questions, concerns, change in symptoms.   Celesta Gentile, DPM

## 2017-08-25 ENCOUNTER — Ambulatory Visit (INDEPENDENT_AMBULATORY_CARE_PROVIDER_SITE_OTHER): Payer: Self-pay | Admitting: Podiatry

## 2017-08-25 ENCOUNTER — Ambulatory Visit (INDEPENDENT_AMBULATORY_CARE_PROVIDER_SITE_OTHER): Payer: Self-pay

## 2017-08-25 ENCOUNTER — Encounter: Payer: Self-pay | Admitting: Podiatry

## 2017-08-25 DIAGNOSIS — M659 Synovitis and tenosynovitis, unspecified: Secondary | ICD-10-CM

## 2017-08-25 DIAGNOSIS — L02619 Cutaneous abscess of unspecified foot: Secondary | ICD-10-CM

## 2017-08-25 DIAGNOSIS — L97512 Non-pressure chronic ulcer of other part of right foot with fat layer exposed: Secondary | ICD-10-CM

## 2017-08-25 DIAGNOSIS — L03119 Cellulitis of unspecified part of limb: Secondary | ICD-10-CM

## 2017-08-25 MED ORDER — CIPROFLOXACIN HCL 500 MG PO TABS: 500.0000 mg | ORAL_TABLET | Freq: Two times a day (BID) | ORAL | 0 refills | Status: DC

## 2017-08-25 MED ORDER — CLINDAMYCIN HCL 300 MG PO CAPS: 300.0000 mg | ORAL_CAPSULE | Freq: Three times a day (TID) | ORAL | 2 refills | Status: DC

## 2017-08-25 NOTE — Patient Instructions (Signed)
Monitor for any signs/symptoms of infection. Call the office immediately if any occur or go directly to the emergency room. Call with any questions/concerns.  

## 2017-08-28 LAB — WOUND CULTURE
MICRO NUMBER: 81373781
SPECIMEN QUALITY:: ADEQUATE

## 2017-08-29 NOTE — Progress Notes (Signed)
Subjective: Ray Bradley presents the office today for concerns of a blister, wound to the right foot on the medial aspect which is been ongoing for about 1 month.  He states he has noticed some yellow to bloody drainage coming from the area he thinks that his shoe rubbed against the foot causing this area to start.  He states that the foot was looking much worse however over the last week or so he has been looking better.  He has been trying to keep the area clean but he denies any recent antibiotics or any other treatment.  This did seem to start after he started wearing a new steel toed shoe.  He denies any red streaks.  He has no other concerns today. Denies any systemic complaints such as fevers, chills, nausea, vomiting. No acute changes since last appointment, and no other complaints at this time.   Objective: AAO x3, NAD DP/PT pulses palpable 2/4 bilaterally, CRT less than 3 seconds Ulceration present medial right foot along the area of the bunion which appears to be a granular wound.  However upon probing the area did probe approximately 2 cm plantar to the first metatarsal.  I was unable to probe to bone.  There was bloody drainage expressed but there is no purulence.  I did culture the wound today.  There is no surrounding erythema there is no fluctuation or crepitation.  There is no pain although he has neuropathy. No open lesions or pre-ulcerative lesions.  No pain with calf compression, swelling, warmth, erythema  Assessment: Ulceration, likely abscess right foot  Plan: -All treatment options discussed with the patient including all alternatives, risks, complications.  -X-rays were obtained and reviewed.  There is no definitive evidence of acute osteomyelitis identified today.  There is no soft tissue emphysema. -The wound was lightly debrided.  Wound culture was also obtained.  Continue with daily dressing changes with a small amount of Betadine to the area daily. -Prescribed clindamycin  and ciprofloxacin. -Surgical shoe dispensed. -Monitor for any clinical signs or symptoms of infection and directed to call the office immediately should any occur or go to the ER. -Follow-up in 10 days or sooner if any issues are to arise. -Patient encouraged to call the office with any questions, concerns, change in symptoms.   Trula Slade DPM

## 2017-09-02 ENCOUNTER — Ambulatory Visit: Payer: Self-pay | Admitting: Podiatry

## 2017-09-08 ENCOUNTER — Ambulatory Visit (INDEPENDENT_AMBULATORY_CARE_PROVIDER_SITE_OTHER): Payer: Self-pay | Admitting: Podiatry

## 2017-09-08 ENCOUNTER — Encounter: Payer: Self-pay | Admitting: Podiatry

## 2017-09-08 DIAGNOSIS — L03119 Cellulitis of unspecified part of limb: Secondary | ICD-10-CM

## 2017-09-08 DIAGNOSIS — L02619 Cutaneous abscess of unspecified foot: Secondary | ICD-10-CM

## 2017-09-08 DIAGNOSIS — L97512 Non-pressure chronic ulcer of other part of right foot with fat layer exposed: Secondary | ICD-10-CM

## 2017-09-10 NOTE — Progress Notes (Signed)
Subjective: Mr. Witz presents the office today for follow-up evaluation of ulceration, abscess right foot.  He states he is doing much better.  He feels that the area is healed.  Denies any drainage or pus.  He still has some swelling has been on his feet quite a bit.  He presents today for follow-up evaluation.  Took antibiotics as directed.  He has no further questions or concerns today. Denies any systemic complaints such as fevers, chills, nausea, vomiting. No acute changes since last appointment, and no other complaints at this time.   Objective: AAO x3, NAD DP/PT pulses palpable bilaterally, CRT less than 3 seconds Swelling continues on the right foot on the plantar and plantar medial first MPJ.  There is no area of fluctuation or crepitation and unable to identify any area of what appears to be an abscess.  Small wound still present however it only probes approximately 0.4 cm and is much improved.  There is no drainage or pus expressed today.  There is no increase in warmth.  There is no ascending cellulitis.  There is no tenderness to palpation to the area. No open lesions or pre-ulcerative lesions.  No pain with calf compression, swelling, warmth, erythema  Assessment: Improving infection right foot  Plan: -All treatment options discussed with the patient including all alternatives, risks, complications.  -This time infection appears to be improving.  Wound culture was growing staph aureus and he was on clindamycin which was adequate for this.  -Debrided the small area of the wound. -Also to aspirate the area today but there is not appear to be any fluid coming in the area I would be able to aspirate.  Area is very supple. -Continue with daily dressing changes with antibiotic ointment to the small area of the wound. -Follow-up in 2 weeks if the area does not resolve or sooner if any issues are to arise. -Monitor for any clinical signs or symptoms of infection and directed to call the  office immediately should any occur or go to the ER. -Patient encouraged to call the office with any questions, concerns, change in symptoms.   Trula Slade DPM

## 2017-09-23 ENCOUNTER — Ambulatory Visit: Payer: Self-pay | Admitting: Podiatry

## 2018-01-23 ENCOUNTER — Other Ambulatory Visit: Payer: Self-pay | Admitting: Nephrology

## 2018-01-23 DIAGNOSIS — E1322 Other specified diabetes mellitus with diabetic chronic kidney disease: Secondary | ICD-10-CM

## 2018-01-23 DIAGNOSIS — N184 Chronic kidney disease, stage 4 (severe): Secondary | ICD-10-CM

## 2018-01-23 DIAGNOSIS — N2581 Secondary hyperparathyroidism of renal origin: Secondary | ICD-10-CM

## 2018-01-23 DIAGNOSIS — I129 Hypertensive chronic kidney disease with stage 1 through stage 4 chronic kidney disease, or unspecified chronic kidney disease: Secondary | ICD-10-CM

## 2018-02-03 ENCOUNTER — Ambulatory Visit
Admission: RE | Admit: 2018-02-03 | Discharge: 2018-02-03 | Disposition: A | Discharge status: Home / Self Care | Payer: No Typology Code available for payment source | Source: Ambulatory Visit | Attending: Nephrology | Admitting: Nephrology

## 2018-02-03 DIAGNOSIS — N184 Chronic kidney disease, stage 4 (severe): Secondary | ICD-10-CM

## 2018-02-03 DIAGNOSIS — I129 Hypertensive chronic kidney disease with stage 1 through stage 4 chronic kidney disease, or unspecified chronic kidney disease: Secondary | ICD-10-CM

## 2018-02-03 DIAGNOSIS — E1322 Other specified diabetes mellitus with diabetic chronic kidney disease: Secondary | ICD-10-CM

## 2018-02-03 DIAGNOSIS — N2581 Secondary hyperparathyroidism of renal origin: Secondary | ICD-10-CM

## 2018-09-21 ENCOUNTER — Other Ambulatory Visit: Payer: Self-pay

## 2018-09-21 DIAGNOSIS — N185 Chronic kidney disease, stage 5: Secondary | ICD-10-CM

## 2018-10-17 ENCOUNTER — Encounter: Payer: Self-pay | Admitting: Vascular Surgery

## 2018-10-17 ENCOUNTER — Ambulatory Visit (HOSPITAL_COMMUNITY)
Admission: RE | Admit: 2018-10-17 | Discharge: 2018-10-17 | Disposition: A | Discharge status: Home / Self Care | Payer: Self-pay | Source: Ambulatory Visit | Attending: Family | Admitting: Family

## 2018-10-17 ENCOUNTER — Ambulatory Visit (INDEPENDENT_AMBULATORY_CARE_PROVIDER_SITE_OTHER)
Admission: RE | Admit: 2018-10-17 | Discharge: 2018-10-17 | Disposition: A | Discharge status: Home / Self Care | Payer: Self-pay | Source: Ambulatory Visit | Attending: Vascular Surgery | Admitting: Vascular Surgery

## 2018-10-17 ENCOUNTER — Other Ambulatory Visit: Payer: Self-pay

## 2018-10-17 ENCOUNTER — Ambulatory Visit (INDEPENDENT_AMBULATORY_CARE_PROVIDER_SITE_OTHER): Payer: Self-pay | Admitting: Vascular Surgery

## 2018-10-17 DIAGNOSIS — N185 Chronic kidney disease, stage 5: Secondary | ICD-10-CM | POA: Insufficient documentation

## 2018-10-17 NOTE — Progress Notes (Signed)
Patient name: Ray Bradley MRN: [id number] DOB: September 13, 1971 Sex: male  REASON FOR CONSULT: Evaluate for new dialysis access  HPI: Benzion Mesta is a 48 y.o. male, with stage IV/V chronic kidney disease that presents for evaluation of new dialysis access.  Per the referral notes the patient was referred by Dr. Justin Mend stating he now had stage V chronic kidney disease.  Patient states his nephrologist is Dr. [name] and he is unaware that he was going to require a fistula and states this has all happened very quickly.  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  Wants a transplant but states without insurance he doesn't think he is a candidate.  He is right-hand dominant.  Never had fistula access before.  No previous central venous catheters.  He would like to set up an appointment with Dr. [name] to further discuss because he was under the understanding that he did not need a fistula at this time.  Past Medical History:  Diagnosis Date  . Chest pain   . Chronic kidney disease   . HTN (hypertension)   . Hyperlipidemia   . Type II or unspecified type diabetes mellitus without mention of complication, not stated as uncontrolled     Past Surgical History:  Procedure Laterality Date  . CIRCUMCISION, NON-NEWBORN  2004  . FOOT SURGERY      Family History  Problem Relation Age of Onset  . Colon cancer Father   . Diabetes Other        family history  . Hypertension Other        family history  . Aneurysm Mother        died age 31, brain  . Hypertension Mother   . Hypertension Sister 78  . Hypertension Sister 12  . Hypertension Brother 43    SOCIAL HISTORY: Social History   Socioeconomic History  . Marital status: Single    Spouse name: Not on file  . Number of children: 3  . Years of education: Not on file  . Highest education level: Not on file  Occupational History  . Not on file  Social Needs  . Financial resource strain: Not on file  . Food  insecurity:    Worry: Not on file    Inability: Not on file  . Transportation needs:    Medical: Not on file    Non-medical: Not on file  Tobacco Use  . Smoking status: Never Smoker  . Smokeless tobacco: Never Used  Substance and Sexual Activity  . Alcohol use: Yes    Comment: occasionally  . Drug use: No  . Sexual activity: Not on file  Lifestyle  . Physical activity:    Days per week: Not on file    Minutes per session: Not on file  . Stress: Not on file  Relationships  . Social connections:    Talks on phone: Not on file    Gets together: Not on file    Attends religious service: Not on file    Active member of club or organization: Not on file    Attends meetings of clubs or organizations: Not on file    Relationship status: Not on file  . Intimate partner violence:    Fear of current or ex partner: Not on file    Emotionally abused: Not on file    Physically abused: Not on file    Forced sexual activity: Not on file  Other Topics Concern  . Not  on file  Social History Narrative   Lives with wife and 3 children.      No Known Allergies  Current Outpatient Medications  Medication Sig Dispense Refill  . amLODipine (NORVASC) 10 MG tablet Take 10 mg by mouth daily.  5  . aspirin EC 81 MG tablet Take 1 tablet by mouth daily.    Marland Kitchen atorvastatin (LIPITOR) 20 MG tablet Take 20 mg by mouth daily.    . carvedilol (COREG) 25 MG tablet Take 25 mg by mouth 2 (two) times daily.    . furosemide (LASIX) 40 MG tablet Take 40 mg by mouth daily.    . hydrALAZINE (APRESOLINE) 100 MG tablet Take 100 mg by mouth 3 (three) times daily.    . hydrochlorothiazide (HYDRODIURIL) 25 MG tablet Take 25 mg by mouth daily.  5  . rosuvastatin (CRESTOR) 5 MG tablet Take 1 tablet by mouth daily.    Marland Kitchen telmisartan (MICARDIS) 80 MG tablet Take 80 mg by mouth daily.  0   No current facility-administered medications for this visit.     REVIEW OF SYSTEMS:  [X]  denotes positive finding, [ ]  denotes  negative finding Cardiac  Comments:  Chest pain or chest pressure:    Shortness of breath upon exertion:    Short of breath when lying flat:    Irregular heart rhythm:        Vascular    Pain in calf, thigh, or hip brought on by ambulation:    Pain in feet at night that wakes you up from your sleep:     Blood clot in your veins:    Leg swelling:         Pulmonary    Oxygen at home:    Productive cough:     Wheezing:         Neurologic    Sudden weakness in arms or legs:     Sudden numbness in arms or legs:     Sudden onset of difficulty speaking or slurred speech:    Temporary loss of vision in one eye:     Problems with dizziness:         Gastrointestinal    Blood in stool:     Vomited blood:         Genitourinary    Burning when urinating:     Blood in urine:        Psychiatric    Major depression:         Hematologic    Bleeding problems:    Problems with blood clotting too easily:        Skin    Rashes or ulcers:        Constitutional    Fever or chills:      PHYSICAL EXAM: Vitals:   10/17/18 1531  BP: (!) 156/83  Pulse: 77  Resp: 20  Temp: 100 F (37.8 C)  SpO2: 95%  Weight: (!) 302 lb (137 kg)  Height: 5\' 10"  (1.778 m)    GENERAL: The patient is a well-nourished male, in no acute distress. The vital signs are documented above. CARDIAC: There is a regular rate and rhythm.  VASCULAR:  2+ palpable radial pulse bilateral upper extremity 2+ palpable brachial pulse bilateral upper extremity No tissue loss Great cephalic vein along bilateral forearms PULMONARY: There is good air exchange bilaterally without wheezing or rales. ABDOMEN: Soft and non-tender. MUSCULOSKELETAL: There are no major deformities or cyanosis. NEUROLOGIC: No focal weakness or paresthesias are detected. SKIN:  There are no ulcers or rashes noted. PSYCHIATRIC: The patient has a normal affect.  DATA:   I reviewed his vein mapping which shows triphasic waveforms of bilateral  upper extremities.  Patient has a nice usable cephalic vein in the left arm which is his nondominant arm.  Assessment/Plan:  Discussed with the patient after review of his vein mapping and arterial duplex a left upper extremity radiocephalic fistula should be reasonable.  He has a very large cephalic vein in the left arm with a nice radial artery at the wrist.  I discussed risks and benefits of dialysis access.  Ultimately patient states he would like contact her office to schedule surgery in the future.  He wants to have a follow-up visit with Dr. [name] to further discuss given he was not aware that he needed a fistula at this time.  I did explain to him that typically in the setting of stage IV and V chronic kidney disease we go ahead and plan for dialysis access given that it takes up to 3 months to mature.  Look forward to assisting him when he is ready to proceed.  He will contact our office to schedule and I gave him my business card.   [name], MD [facility] Office: [phone] Pager: [phone]

## 2018-11-15 ENCOUNTER — Encounter: Payer: Self-pay | Admitting: Nephrology

## 2019-06-27 ENCOUNTER — Other Ambulatory Visit: Payer: Self-pay

## 2019-06-27 DIAGNOSIS — N185 Chronic kidney disease, stage 5: Secondary | ICD-10-CM

## 2019-07-02 ENCOUNTER — Telehealth (HOSPITAL_COMMUNITY): Payer: Self-pay | Admitting: *Deleted

## 2019-07-02 NOTE — Telephone Encounter (Signed)
The above patient or their representative was contacted and gave the following answers to these questions:         Do you have any of the following symptoms?    NO  Fever                    Cough                   Shortness of breath  Do  you have any of the following other symptoms?    muscle pain         vomiting,        diarrhea        rash         weakness        red eye        abdominal pain         bruising          bruising or bleeding              joint pain           severe headache    Have you been in contact with someone who was or has been sick in the past 2 weeks?  NO  Yes                 Unsure                         Unable to assess   Does the person that you were in contact with have any of the following symptoms?   Cough         shortness of breath           muscle pain         vomiting,            diarrhea            rash            weakness           fever            red eye           abdominal pain           bruising  or  bleeding                joint pain                severe headache                 COMMENTS OR ACTION PLAN FOR THIS PATIENT:         q  

## 2019-07-03 ENCOUNTER — Ambulatory Visit (INDEPENDENT_AMBULATORY_CARE_PROVIDER_SITE_OTHER): Payer: Self-pay | Admitting: Vascular Surgery

## 2019-07-03 ENCOUNTER — Ambulatory Visit (INDEPENDENT_AMBULATORY_CARE_PROVIDER_SITE_OTHER)
Admission: RE | Admit: 2019-07-03 | Discharge: 2019-07-03 | Disposition: A | Discharge status: Home / Self Care | Payer: Self-pay | Source: Ambulatory Visit | Attending: Vascular Surgery | Admitting: Vascular Surgery

## 2019-07-03 ENCOUNTER — Ambulatory Visit (HOSPITAL_COMMUNITY)
Admission: RE | Admit: 2019-07-03 | Discharge: 2019-07-03 | Disposition: A | Discharge status: Home / Self Care | Payer: Self-pay | Source: Ambulatory Visit | Attending: Vascular Surgery | Admitting: Vascular Surgery

## 2019-07-03 ENCOUNTER — Encounter: Payer: Self-pay | Admitting: Vascular Surgery

## 2019-07-03 ENCOUNTER — Encounter: Payer: Self-pay | Admitting: *Deleted

## 2019-07-03 ENCOUNTER — Other Ambulatory Visit: Payer: Self-pay

## 2019-07-03 VITALS — BP 212/98 | HR 62 | Temp 98.1°F | Resp 16 | Ht 70.0 in | Wt 279.0 lb

## 2019-07-03 DIAGNOSIS — N185 Chronic kidney disease, stage 5: Secondary | ICD-10-CM

## 2019-07-03 NOTE — Progress Notes (Signed)
Patient name: Ray Bradley MRN: [id number] DOB: 1971/05/15 Sex: male  REASON FOR CONSULT: Follow-up to discuss AV fistula access  HPI: Ray Bradley is a 48 y.o. male, with stage V chronic kidney disease that presents to further discuss hemodialysis access.  I previously saw him in January 2020 and we discussed options for access.  At the time he was not aware that he was going to require fistula placement and wanted to follow-up with his nephrologist Dr. Hollie Bradley.  He states he has since been seen at [facility] for another opinion and ultimately has accepted that he may need dialysis.  He is right-handed.  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  No previous fistula access.  No catheter at this time.  States he feels great overall.   Past Medical History:  Diagnosis Date  . Chest pain   . Chronic kidney disease   . HTN (hypertension)   . Hyperlipidemia   . Type II or unspecified type diabetes mellitus without mention of complication, not stated as uncontrolled     Past Surgical History:  Procedure Laterality Date  . CIRCUMCISION, NON-NEWBORN  2004  . FOOT SURGERY      Family History  Problem Relation Age of Onset  . Colon cancer Father   . Diabetes Other        family history  . Hypertension Other        family history  . Aneurysm Mother        died age 79, brain  . Hypertension Mother   . Hypertension Sister 66  . Hypertension Sister 61  . Hypertension Brother 87    SOCIAL HISTORY: Social History   Socioeconomic History  . Marital status: Single    Spouse name: Not on file  . Number of children: 3  . Years of education: Not on file  . Highest education level: Not on file  Occupational History  . Not on file  Social Needs  . Financial resource strain: Not on file  . Food insecurity    Worry: Not on file    Inability: Not on file  . Transportation needs    Medical: Not on file    Non-medical: Not on file  Tobacco Use  . Smoking status: Never Smoker   . Smokeless tobacco: Never Used  Substance and Sexual Activity  . Alcohol use: Yes    Comment: occasionally  . Drug use: No  . Sexual activity: Not on file  Lifestyle  . Physical activity    Days per week: Not on file    Minutes per session: Not on file  . Stress: Not on file  Relationships  . Social Herbalist on phone: Not on file    Gets together: Not on file    Attends religious service: Not on file    Active member of club or organization: Not on file    Attends meetings of clubs or organizations: Not on file    Relationship status: Not on file  . Intimate partner violence    Fear of current or ex partner: Not on file    Emotionally abused: Not on file    Physically abused: Not on file    Forced sexual activity: Not on file  Other Topics Concern  . Not on file  Social History Narrative   Lives with wife and 3 children.      No Known Allergies  Current Outpatient Medications  Medication Sig Dispense Refill  .  amLODipine (NORVASC) 10 MG tablet Take 10 mg by mouth daily.  5  . aspirin EC 81 MG tablet Take 1 tablet by mouth daily.    Marland Kitchen atorvastatin (LIPITOR) 20 MG tablet Take 20 mg by mouth daily.    . carvedilol (COREG) 25 MG tablet Take 25 mg by mouth 2 (two) times daily.    . furosemide (LASIX) 40 MG tablet Take 40 mg by mouth daily.    . hydrALAZINE (APRESOLINE) 100 MG tablet Take 100 mg by mouth 3 (three) times daily.    . hydrochlorothiazide (HYDRODIURIL) 25 MG tablet Take 25 mg by mouth daily.  5  . rosuvastatin (CRESTOR) 5 MG tablet Take 1 tablet by mouth daily.    Marland Kitchen telmisartan (MICARDIS) 80 MG tablet Take 80 mg by mouth daily.  0   No current facility-administered medications for this visit.     REVIEW OF SYSTEMS:  [X]  denotes positive finding, [ ]  denotes negative finding Cardiac  Comments:  Chest pain or chest pressure:    Shortness of breath upon exertion:    Short of breath when lying flat:    Irregular heart rhythm:        Vascular     Pain in calf, thigh, or hip brought on by ambulation:    Pain in feet at night that wakes you up from your sleep:     Blood clot in your veins:    Leg swelling:         Pulmonary    Oxygen at home:    Productive cough:     Wheezing:         Neurologic    Sudden weakness in arms or legs:     Sudden numbness in arms or legs:     Sudden onset of difficulty speaking or slurred speech:    Temporary loss of vision in one eye:     Problems with dizziness:         Gastrointestinal    Blood in stool:     Vomited blood:         Genitourinary    Burning when urinating:     Blood in urine:        Psychiatric    Major depression:         Hematologic    Bleeding problems:    Problems with blood clotting too easily:        Skin    Rashes or ulcers:        Constitutional    Fever or chills:      PHYSICAL EXAM: Vitals:   07/03/19 1134 07/03/19 1141  BP: (!) 209/96 (!) 212/98  Pulse: 62 62  Resp: 16   Temp: 98.1 F (36.7 C)   TempSrc: Temporal   SpO2: 100%   Weight: 279 lb (126.6 kg)   Height: 5\' 10"  (1.778 m)     GENERAL: The patient is a well-nourished male, in no acute distress. The vital signs are documented above. CARDIAC: There is a regular rate and rhythm.  VASCULAR:  2+ palpable radial pulse bilateral upper extremity 2+ palpable brachial pulse bilateral upper extremity Nice cephalic vein in left forearm visible PULMONARY: There is good air exchange bilaterally without wheezing or rales. ABDOMEN: Soft and non-tender with normal pitched bowel sounds.  MUSCULOSKELETAL: There are no major deformities or cyanosis. NEUROLOGIC: No focal weakness or paresthesias are detected.   DATA:   Arterial duplex again shows triphasic waveforms in bilateral upper extremities.  On  the left he has a very nice cephalic vein from the distal forearm all the way up to the shoulder.  Assessment/Plan:  48 year old male with stage V chronic kidney disease that presents to further  discuss dialysis access after initial evaluation in January.  He has since got a second opinion from a nephrologist at [facility] and is willing to proceed with fistula placement at this time.  Discussed plan for left arm AV fistula given he is right-handed.  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  Options would be a left radiocephalic versus brachiocephalic and will evaluate in the operating room with ultrasound.  Risk benefits discussed with the patient including risk of steal, failure to mature, bleeding, nerve injury etc.   Ray Heck, MD [facility] Office: [phone] Pager: [phone]

## 2019-07-16 ENCOUNTER — Other Ambulatory Visit (HOSPITAL_COMMUNITY): Payer: Self-pay | Admitting: Nephrology

## 2019-07-16 DIAGNOSIS — N185 Chronic kidney disease, stage 5: Secondary | ICD-10-CM

## 2019-07-20 ENCOUNTER — Other Ambulatory Visit: Payer: Self-pay | Admitting: Radiology

## 2019-07-23 ENCOUNTER — Other Ambulatory Visit: Payer: Self-pay

## 2019-07-23 ENCOUNTER — Other Ambulatory Visit (HOSPITAL_COMMUNITY): Payer: Self-pay | Admitting: Nephrology

## 2019-07-23 ENCOUNTER — Ambulatory Visit (HOSPITAL_COMMUNITY)
Admission: RE | Admit: 2019-07-23 | Discharge: 2019-07-23 | Disposition: A | Discharge status: Home / Self Care | Payer: Self-pay | Source: Ambulatory Visit | Attending: Nephrology | Admitting: Nephrology

## 2019-07-23 ENCOUNTER — Encounter (HOSPITAL_COMMUNITY): Payer: Self-pay | Admitting: Interventional Radiology

## 2019-07-23 DIAGNOSIS — Z79899 Other long term (current) drug therapy: Secondary | ICD-10-CM | POA: Insufficient documentation

## 2019-07-23 DIAGNOSIS — I120 Hypertensive chronic kidney disease with stage 5 chronic kidney disease or end stage renal disease: Secondary | ICD-10-CM | POA: Insufficient documentation

## 2019-07-23 DIAGNOSIS — E1122 Type 2 diabetes mellitus with diabetic chronic kidney disease: Secondary | ICD-10-CM | POA: Insufficient documentation

## 2019-07-23 DIAGNOSIS — Z8249 Family history of ischemic heart disease and other diseases of the circulatory system: Secondary | ICD-10-CM | POA: Insufficient documentation

## 2019-07-23 DIAGNOSIS — E785 Hyperlipidemia, unspecified: Secondary | ICD-10-CM | POA: Insufficient documentation

## 2019-07-23 DIAGNOSIS — N185 Chronic kidney disease, stage 5: Secondary | ICD-10-CM

## 2019-07-23 DIAGNOSIS — Z7982 Long term (current) use of aspirin: Secondary | ICD-10-CM | POA: Insufficient documentation

## 2019-07-23 HISTORY — PX: IR US GUIDE VASC ACCESS RIGHT: IMG2390

## 2019-07-23 HISTORY — PX: IR FLUORO GUIDE CV LINE RIGHT: IMG2283

## 2019-07-23 LAB — CBC
HCT: 28.1 % — ABNORMAL LOW (ref 39.0–52.0)
Hemoglobin: 9 g/dL — ABNORMAL LOW (ref 13.0–17.0)
MCH: 25.1 pg — ABNORMAL LOW (ref 26.0–34.0)
MCHC: 32 g/dL (ref 30.0–36.0)
MCV: 78.5 fL — ABNORMAL LOW (ref 80.0–100.0)
Platelets: 229 10*3/uL (ref 150–400)
RBC: 3.58 MIL/uL — ABNORMAL LOW (ref 4.22–5.81)
RDW: 13.2 % (ref 11.5–15.5)
WBC: 6.1 10*3/uL (ref 4.0–10.5)
nRBC: 0 % (ref 0.0–0.2)

## 2019-07-23 LAB — PROTIME-INR
INR: 1.1 (ref 0.8–1.2)
Prothrombin Time: 13.8 seconds (ref 11.4–15.2)

## 2019-07-23 LAB — GLUCOSE, CAPILLARY: Glucose-Capillary: 109 mg/dL — ABNORMAL HIGH (ref 70–99)

## 2019-07-23 MED ORDER — HEPARIN SODIUM (PORCINE) 1000 UNIT/ML IJ SOLN
INTRAMUSCULAR | Status: AC | PRN
  Administered 2019-07-23: 3200 [IU] via INTRAVENOUS

## 2019-07-23 MED ORDER — SODIUM CHLORIDE 0.9 % IV SOLN
INTRAVENOUS | Status: AC | PRN
  Administered 2019-07-23: 10 mL/h via INTRAVENOUS

## 2019-07-23 MED ORDER — FENTANYL CITRATE (PF) 100 MCG/2ML IJ SOLN
INTRAMUSCULAR | Status: AC
  Filled 2019-07-23: qty 4

## 2019-07-23 MED ORDER — FENTANYL CITRATE (PF) 100 MCG/2ML IJ SOLN
INTRAMUSCULAR | Status: AC | PRN
  Administered 2019-07-23: 50 ug via INTRAVENOUS
  Administered 2019-07-23: 25 ug via INTRAVENOUS

## 2019-07-23 MED ORDER — MIDAZOLAM HCL 2 MG/2ML IJ SOLN
INTRAMUSCULAR | Status: AC | PRN
  Administered 2019-07-23: 1 mg via INTRAVENOUS
  Administered 2019-07-23: 0.5 mg via INTRAVENOUS

## 2019-07-23 MED ORDER — HEPARIN SODIUM (PORCINE) 1000 UNIT/ML IJ SOLN
INTRAMUSCULAR | Status: AC
  Filled 2019-07-23: qty 1

## 2019-07-23 MED ORDER — DEXTROSE 5 % IV SOLN
3.0000 g | INTRAVENOUS | Status: AC
  Administered 2019-07-23: 3 g via INTRAVENOUS
  Filled 2019-07-23: qty 3000

## 2019-07-23 MED ORDER — MIDAZOLAM HCL 2 MG/2ML IJ SOLN
INTRAMUSCULAR | Status: AC
  Filled 2019-07-23: qty 4

## 2019-07-23 MED ORDER — LIDOCAINE HCL 1 % IJ SOLN
INTRAMUSCULAR | Status: AC
  Filled 2019-07-23: qty 20

## 2019-07-23 MED ORDER — LIDOCAINE HCL 1 % IJ SOLN
INTRAMUSCULAR | Status: AC | PRN
  Administered 2019-07-23: 5 mL

## 2019-07-23 NOTE — Sedation Documentation (Signed)
Patient is resting comfortably. 

## 2019-07-23 NOTE — Sedation Documentation (Signed)
02 d/c 

## 2019-07-23 NOTE — Procedures (Signed)
Interventional Radiology Procedure Note  Procedure: Right IJ tunneled HD catheter placement  Complications: None  Estimated Blood Loss: < 10 mL  Findings: 19 cm tip to cuff length Palindrome catheter placed with tip at SVC/RA junction. OK to use.  Venetia Night. Kathlene Cote, M.D Pager:  [phone]

## 2019-07-23 NOTE — Progress Notes (Signed)
Discharge instructions reviewed with patient and family. Verbalized understanding. 

## 2019-07-23 NOTE — H&P (Signed)
Chief Complaint: Patient was seen in consultation today for CKD stage V/tunneled HD catheter placement.  Referring Physician(s): Bradley,Ray  Supervising Physician: Ray Bradley  Patient Status: [facility] - Out-pt  History of Present Illness: Ray Bradley is a 48 y.o. male with a past medical history of hypertension, hyperlipidemia, CKD stage V, and diabetes mellitus type II. His renal disease has been managed by Dr. Hollie Bradley who believes patient will require hemodialysis initiation at this time. He met with Dr. Carlis Bradley (vascular surgery) for AV fistula access, and has tentative plans for placement 08/24/2019.  IR requested by Dr. Hollie Bradley for possible image-guided tunneled HD catheter placement. Patient awake and alert laying in bed with no complaints at this time. Denies fever, chills, chest pain, dyspnea, abdominal pain, or headache.   Past Medical History:  Diagnosis Date   Chest pain    Chronic kidney disease    HTN (hypertension)    Hyperlipidemia    Type II or unspecified type diabetes mellitus without mention of complication, not stated as uncontrolled     Past Surgical History:  Procedure Laterality Date   CIRCUMCISION, NON-NEWBORN  2004   FOOT SURGERY      Allergies: Patient has no known allergies.  Medications: Prior to Admission medications   Medication Sig Start Date End Date Taking? Authorizing Provider  amLODipine (NORVASC) 10 MG tablet Take 10 mg by mouth daily. 04/15/15  Yes [provider]  aspirin EC 81 MG tablet Take 1 tablet by mouth daily.   Yes [provider]  atorvastatin (LIPITOR) 20 MG tablet Take 20 mg by mouth daily.   Yes [provider]  carvedilol (COREG) 25 MG tablet Take 25 mg by mouth 2 (two) times daily. 08/20/18  Yes [provider]  furosemide (LASIX) 80 MG tablet Take 80 mg by mouth 2 (two) times daily.  10/04/18  Yes [provider]  hydrALAZINE (APRESOLINE) 100 MG tablet Take 100 mg  by mouth 3 (three) times daily. 10/04/18  Yes [provider]  hydrochlorothiazide (HYDRODIURIL) 25 MG tablet Take 25 mg by mouth daily. 04/15/15  Yes [provider]  rosuvastatin (CRESTOR) 5 MG tablet Take 1 tablet by mouth daily. 08/20/18  Yes [provider]  telmisartan (MICARDIS) 80 MG tablet Take 80 mg by mouth daily. 04/15/15  Yes [provider]     Family History  Problem Relation Age of Onset   Colon cancer Father    Diabetes Other        family history   Hypertension Other        family history   Aneurysm Mother        died age 59, brain   Hypertension Mother    Hypertension Sister 30   Hypertension Sister 73   Hypertension Brother 61    Social History   Socioeconomic History   Marital status: Married    Spouse name: Not on file   Number of children: 3   Years of education: Not on file   Highest education level: Not on file  Occupational History   Not on file  Social Needs   Financial resource strain: Not on file   Food insecurity    Worry: Not on file    Inability: Not on file   Transportation needs    Medical: Not on file    Non-medical: Not on file  Tobacco Use   Smoking status: Never Smoker   Smokeless tobacco: Never Used  Substance and Sexual Activity   Alcohol  use: Yes    Comment: occasionally   Drug use: No   Sexual activity: Not on file  Lifestyle   Physical activity    Days per week: Not on file    Minutes per session: Not on file   Stress: Not on file  Relationships   Social connections    Talks on phone: Not on file    Gets together: Not on file    Attends religious service: Not on file    Active member of club or organization: Not on file    Attends meetings of clubs or organizations: Not on file    Relationship status: Not on file  Other Topics Concern   Not on file  Social History Narrative   Lives with wife and 3 children.       Review of Systems: A 12 point ROS  discussed and pertinent positives are indicated in the HPI above.  All other systems are negative.  Review of Systems  Constitutional: Negative for chills and fever.  Respiratory: Negative for shortness of breath and wheezing.   Cardiovascular: Negative for chest pain and palpitations.  Gastrointestinal: Negative for abdominal pain.  Neurological: Negative for headaches.  Psychiatric/Behavioral: Negative for behavioral problems and confusion.    Vital Signs: BP (!) 192/99    Pulse 70    Temp 98.2 F (36.8 C) (Skin)    Resp 18    Ht 5' 10"  (1.778 m)    Wt 275 lb (124.7 kg)    SpO2 100%    BMI 39.46 kg/m   Physical Exam Vitals signs and nursing note reviewed.  Constitutional:      General: He is not in acute distress.    Appearance: Normal appearance.  Cardiovascular:     Rate and Rhythm: Normal rate and regular rhythm.     Heart sounds: Normal heart sounds. No murmur.  Pulmonary:     Effort: Pulmonary effort is normal. No respiratory distress.     Breath sounds: Normal breath sounds. No wheezing.  Skin:    General: Skin is warm and dry.  Neurological:     Mental Status: He is alert and oriented to person, place, and time.  Psychiatric:        Mood and Affect: Mood normal.        Behavior: Behavior normal.        Thought Content: Thought content normal.        Judgment: Judgment normal.      MD Evaluation Airway: WNL Heart: WNL Abdomen: WNL Chest/ Lungs: WNL ASA  Classification: 3 Mallampati/Airway Score: Two   Imaging: Vas [place] Upper Extremity Arterial Duplex  Result Date: 07/03/2019 UPPER EXTREMITY DUPLEX STUDY Indications: Pre-operative exam.  Performing Technologist: Alvia Grove RVT  Examination Guidelines: A complete evaluation includes B-mode imaging, spectral Doppler, color Doppler, and power Doppler as needed of all accessible portions of each vessel. Bilateral testing is considered an integral part of a complete examination. Limited examinations for  reoccurring indications may be performed as noted.  Right Pre-Dialysis Findings: +-----------------------+----------+--------------------+---------+--------+  Location                PSV (cm/s) Intralum. Diam. (cm) Waveform  Comments  +-----------------------+----------+--------------------+---------+--------+  Brachial Antecub. fossa 73         0.55                 triphasic           +-----------------------+----------+--------------------+---------+--------+  Radial Art at Wrist  94         0.32                 triphasic           +-----------------------+----------+--------------------+---------+--------+  Ulnar Art at Wrist      81         0.21                 triphasic           +-----------------------+----------+--------------------+---------+--------+ Left Pre-Dialysis Findings: +-----------------------+----------+--------------------+---------+--------+  Location                PSV (cm/s) Intralum. Diam. (cm) Waveform  Comments  +-----------------------+----------+--------------------+---------+--------+  Brachial Antecub. fossa 79         0.40                 triphasic           +-----------------------+----------+--------------------+---------+--------+  Radial Art at Wrist     78         0.33                 triphasic           +-----------------------+----------+--------------------+---------+--------+  Ulnar Art at Wrist      55         0.20                 triphasic           +-----------------------+----------+--------------------+---------+--------+  Summary:   Measurements above. *See table(s) above for measurements and observations. Electronically signed by Monica Martinez MD on 07/03/2019 at 1:11:00 PM.    Final    Vas [place] Upper Extremity Vein Mapping  Result Date: 07/03/2019 UPPER EXTREMITY VEIN MAPPING  Indications: Pre-access. Performing Technologist: Alvia Grove RVT  Examination Guidelines: A complete evaluation includes B-mode imaging, spectral Doppler, color Doppler, and power  Doppler as needed of all accessible portions of each vessel. Bilateral testing is considered an integral part of a complete examination. Limited examinations for reoccurring indications may be performed as noted. +-----------------+-------------+----------+---------+  Right Cephalic    Diameter (cm) Depth (cm) Findings   +-----------------+-------------+----------+---------+  Shoulder              0.46                            +-----------------+-------------+----------+---------+  Prox upper arm        0.37                 branching  +-----------------+-------------+----------+---------+  Mid upper arm         0.41                            +-----------------+-------------+----------+---------+  Dist upper arm        0.43                            +-----------------+-------------+----------+---------+  Antecubital fossa     0.62                            +-----------------+-------------+----------+---------+  Prox forearm       0.26 / 0.47             branching  +-----------------+-------------+----------+---------+  Mid forearm  0.20                            +-----------------+-------------+----------+---------+  Dist forearm          0.19                            +-----------------+-------------+----------+---------+  Wrist                 0.18                            +-----------------+-------------+----------+---------+ +-----------------+-------------+----------+--------+  Right Basilic     Diameter (cm) Depth (cm) Findings  +-----------------+-------------+----------+--------+  Prox upper arm        0.59                           +-----------------+-------------+----------+--------+  Mid upper arm         0.63                           +-----------------+-------------+----------+--------+  Dist upper arm        0.25                           +-----------------+-------------+----------+--------+  Antecubital fossa     0.19                            +-----------------+-------------+----------+--------+  Prox forearm          0.13                           +-----------------+-------------+----------+--------+ +-----------------+-------------+----------+--------+  Left Cephalic     Diameter (cm) Depth (cm) Findings  +-----------------+-------------+----------+--------+  Shoulder              0.44                           +-----------------+-------------+----------+--------+  Prox upper arm        0.30                           +-----------------+-------------+----------+--------+  Mid upper arm      0.38 / 0.46                       +-----------------+-------------+----------+--------+  Dist upper arm        0.57                           +-----------------+-------------+----------+--------+  Antecubital fossa     0.42                  joins    +-----------------+-------------+----------+--------+  Prox forearm          0.31                           +-----------------+-------------+----------+--------+  Mid forearm           0.31                           +-----------------+-------------+----------+--------+  Dist forearm       0.26 / 0.23                       +-----------------+-------------+----------+--------+  Wrist                 0.18                           +-----------------+-------------+----------+--------+ +-----------------+-------------+----------+---------+  Left Basilic      Diameter (cm) Depth (cm) Findings   +-----------------+-------------+----------+---------+  Prox upper arm        0.43                            +-----------------+-------------+----------+---------+  Mid upper arm         0.38                            +-----------------+-------------+----------+---------+  Dist upper arm     0.35 / 0.21             branching  +-----------------+-------------+----------+---------+  Antecubital fossa     0.22                            +-----------------+-------------+----------+---------+  Prox forearm       0.15 / 0.10                         +-----------------+-------------+----------+---------+ Summary:   Measurements above. *See table(s) above for measurements and observations.   Diagnosing physician: Monica Martinez MD Electronically signed by Monica Martinez MD on 07/03/2019 at 1:12:50 PM.    Final     Labs:  CBC: Recent Labs    07/23/19 0830  WBC 6.1  HGB 9.0*  HCT 28.1*  PLT 229    COAGS: Recent Labs    07/23/19 0830  INR 1.1     Assessment and Plan:  CKD stage V requiring HD initation. Plan for image-guided tunneled HD catheter placement today in IR. Patient is NPO. Afebrile and WBCs WNL. He does not take blood thinners. INR 1.1 today.  Risks and benefits discussed with the patient including, but not limited to bleeding, infection, vascular injury, pneumothorax which may require chest tube placement, air embolism or even death. All of the patient's questions were answered, patient is agreeable to proceed. Consent signed and in chart.   Thank you for this interesting consult.  I greatly enjoyed meeting Krishan Mcbreen and look forward to participating in their care.  A copy of this report was sent to the requesting provider on this date.  Electronically Signed: Earley Abide, PA-C 07/23/2019, 9:26 AM   I spent a total of 40 Minutes in face to face in clinical consultation, greater than 50% of which was counseling/coordinating care for CKD stage V/tunneled HD catheter placement.

## 2019-07-23 NOTE — Discharge Instructions (Addendum)
Moderate Conscious Sedation, Adult, Care After These instructions provide you with information about caring for yourself after your procedure. Your health care provider may also give you more specific instructions. Your treatment has been planned according to current medical practices, but problems sometimes occur. Call your health care provider if you have any problems or questions after your procedure. What can I expect after the procedure? After your procedure, it is common:  To feel sleepy for several hours.  To feel clumsy and have poor balance for several hours.  To have poor judgment for several hours.  To vomit if you eat too soon. Follow these instructions at home: For at least 24 hours after the procedure:   Do not: ? Participate in activities where you could fall or become injured. ? Drive. ? Use heavy machinery. ? Drink alcohol. ? Take sleeping pills or medicines that cause drowsiness. ? Make important decisions or sign legal documents. ? Take care of children on your own.  Rest. Eating and drinking  Follow the diet recommended by your health care provider.  If you vomit: ? Drink water, juice, or soup when you can drink without vomiting. ? Make sure you have little or no nausea before eating solid foods. General instructions  Have a responsible adult stay with you until you are awake and alert.  Take over-the-counter and prescription medicines only as told by your health care provider.  If you smoke, do not smoke without supervision.  Keep all follow-up visits as told by your health care provider. This is important. Contact a health care provider if:  You keep feeling nauseous or you keep vomiting.  You feel light-headed.  You develop a rash.  You have a fever. Get help right away if:  You have trouble breathing. This information is not intended to replace advice given to you by your health care provider. Make sure you discuss any questions you have  with your health care provider. Document Released: 06/27/2013 Document Revised: 08/19/2017 Document Reviewed: 12/27/2015 Elsevier Patient Education  2020 [place] Catheter Insertion Tunneled catheter insertion is a procedure to insert a small, thin tube (catheter) into a vein. The catheter makes it easier to draw blood, give blood products, give nutrition, remove waste products from the blood (hemodialysis), and give medicines. This procedure is usually done when the bloodstream needs to be accessed many times over a long period of time. Typically, a tunnel catheter is inserted in the neck, chest, or groin. During the procedure, the catheter is tunneled under the skin. This means that the catheter is inserted under the skin and then moved away from the insertion site. Placing the catheter a few inches away from the insertion site reduces the risk of infection. Tell a health care provider about:  Any allergies you have.  All medicines you are taking, including vitamins, herbs, eye drops, creams, and over-the-counter medicines.  Any problems you or family members have had with anesthetic medicines.  Any blood disorders you have.  Any surgeries you have had.  Any medical conditions you have or have had.  Whether you are pregnant or may be pregnant. What are the risks? Generally, this is a safe procedure. However, problems may occur, including:  Infection.  Bleeding.  Allergic reactions to medicines.  Damage to nearby structures or organs.  Damage to the blood vessel that the catheter is inserted into.  Insertion of the catheter into the wrong blood vessel.  Development of: ? A kink, hole, or crack in  the catheter. ? An air bubble in the catheter.  Blockage of the catheter.  Blood clots near the catheter.  Irregular heartbeat. This is rare.  Lung collapse. This is rare. What happens before the procedure? Staying hydrated Follow instructions from your health  care provider about hydration, which may include:  Up to 2 hours before the procedure - you may continue to drink clear liquids, such as water, clear fruit juice, black coffee, and plain tea.  Eating and drinking restrictions Follow instructions from your health care provider about eating and drinking, which may include:  8 hours before the procedure - stop eating heavy meals or foods, such as meat, fried foods, or fatty foods.  6 hours before the procedure - stop eating light meals or foods, such as toast or cereal.  6 hours before the procedure - stop drinking milk or drinks that contain milk.  2 hours before the procedure - stop drinking clear liquids. Medicines Ask your health care provider about:  Changing or stopping your regular medicines. This is especially important if you are taking diabetes medicines or blood thinners.  Taking medicines such as aspirin and ibuprofen. These medicines can thin your blood. Do not take these medicines unless your health care provider tells you to take them.  Taking over-the-counter medicines, vitamins, herbs, and supplements. General instructions  You may have a blood or urine sample taken.  Plan to have someone take you home from the hospital or clinic.  If you will be going home right after the procedure, plan to have someone with you for 24 hours.  Ask your health care provider: ? How your surgery site will be marked. ? What steps will be taken to help prevent infection. These may include:  Removing hair at the surgery site.  Washing skin with a germ-killing soap.  Taking antibiotic medicine. What happens during the procedure?   An IV will be inserted into one of your veins.  You will be given one or both of the following: ? A medicine to help you relax (sedative). ? A medicine to numb the area (local anesthetic).  An ultrasound of your insertion site may be done. It can help your surgeon see your blood vessels more  clearly.  The skin will be cleaned.  Two incisions will be made. One incision will be made at the insertion site and the other will be made at the exit site.  A tunnel will be made under your skin, between the two incisions.  A small wire (guide wire) will be inserted into your vein. The guide wire will help to move the catheter into your vein.  The catheter will be inserted into your vein and threaded through the tunnel under your skin. ? X-ray equipment may be used to watch the catheter as it is being inserted. ? The catheter will be kept in place under the skin.  Stitches (sutures) will be used to partially close your incision and to hold your catheter in place where it exits your body.  An X-ray may be done to make sure your catheter is in the right place.  Caps will be screwed onto the ends of the catheter that are outside your body.  The incision that was used to insert your catheter may be closed with sutures, skin glue, or adhesive strips.  Bandages (dressings) will be placed over your incisions. The procedure may vary among health care providers and hospitals. What happens after the procedure?  Your blood pressure, heart rate,  breathing rate, and blood oxygen level will be monitored until you leave the hospital or clinic.  You may continue to get fluids and medicines through an IV.  You may have pain, bruising, or swelling at your insertion site. Medicines will be available to help you.  Do not drive for 24 hours if you were given a sedative during your procedure. Summary  Tunneled catheter insertion is a procedure to insert a small, thin tube (catheter) into a vein.  Before the procedure, follow instructions from your health care provider about changing or stopping your medicines and about eating and drinking.  During the procedure, you will be given medicine to help you relax (sedative) and medicine to numb the area (local anesthetic).  After the procedure you may  have pain, bruising, or swelling at your insertion site. Medicines will be available to help you.  Do not drive for 24 hours if you were given a sedative during your procedure. This information is not intended to replace advice given to you by your health care provider. Make sure you discuss any questions you have with your health care provider. Document Released: 09/26/2007 Document Revised: 08/29/2018 Document Reviewed: 08/29/2018 Elsevier Patient Education  [place]. Moderate Conscious Sedation, Adult, Care After These instructions provide you with information about caring for yourself after your procedure. Your health care provider may also give you more specific instructions. Your treatment has been planned according to current medical practices, but problems sometimes occur. Call your health care provider if you have any problems or questions after your procedure. What can I expect after the procedure? After your procedure, it is common:  To feel sleepy for several hours.  To feel clumsy and have poor balance for several hours.  To have poor judgment for several hours.  To vomit if you eat too soon. Follow these instructions at home: For at least 24 hours after the procedure:   Do not: ? Participate in activities where you could fall or become injured. ? Drive. ? Use heavy machinery. ? Drink alcohol. ? Take sleeping pills or medicines that cause drowsiness. ? Make important decisions or sign legal documents. ? Take care of children on your own.  Rest. Eating and drinking  Follow the diet recommended by your health care provider.  If you vomit: ? Drink water, juice, or soup when you can drink without vomiting. ? Make sure you have little or no nausea before eating solid foods. General instructions  Have a responsible adult stay with you until you are awake and alert.  Take over-the-counter and prescription medicines only as told by your health care  provider.  If you smoke, do not smoke without supervision.  Keep all follow-up visits as told by your health care provider. This is important. Contact a health care provider if:  You keep feeling nauseous or you keep vomiting.  You feel light-headed.  You develop a rash.  You have a fever. Get help right away if:  You have trouble breathing. This information is not intended to replace advice given to you by your health care provider. Make sure you discuss any questions you have with your health care provider. Document Released: 06/27/2013 Document Revised: 08/19/2017 Document Reviewed: 12/27/2015 Elsevier Patient Education  2020 [employer].

## 2019-07-25 DIAGNOSIS — R52 Pain, unspecified: Secondary | ICD-10-CM | POA: Insufficient documentation

## 2019-07-25 DIAGNOSIS — D631 Anemia in chronic kidney disease: Secondary | ICD-10-CM | POA: Insufficient documentation

## 2019-07-25 DIAGNOSIS — Z992 Dependence on renal dialysis: Secondary | ICD-10-CM | POA: Insufficient documentation

## 2019-07-25 DIAGNOSIS — T829XXA Unspecified complication of cardiac and vascular prosthetic device, implant and graft, initial encounter: Secondary | ICD-10-CM | POA: Insufficient documentation

## 2019-07-25 DIAGNOSIS — D509 Iron deficiency anemia, unspecified: Secondary | ICD-10-CM | POA: Insufficient documentation

## 2019-07-25 DIAGNOSIS — E1140 Type 2 diabetes mellitus with diabetic neuropathy, unspecified: Secondary | ICD-10-CM | POA: Insufficient documentation

## 2019-07-25 DIAGNOSIS — R197 Diarrhea, unspecified: Secondary | ICD-10-CM | POA: Insufficient documentation

## 2019-07-25 DIAGNOSIS — E11319 Type 2 diabetes mellitus with unspecified diabetic retinopathy without macular edema: Secondary | ICD-10-CM | POA: Insufficient documentation

## 2019-07-25 DIAGNOSIS — N2581 Secondary hyperparathyroidism of renal origin: Secondary | ICD-10-CM | POA: Insufficient documentation

## 2019-07-25 DIAGNOSIS — R0602 Shortness of breath: Secondary | ICD-10-CM | POA: Insufficient documentation

## 2019-07-25 DIAGNOSIS — L299 Pruritus, unspecified: Secondary | ICD-10-CM | POA: Insufficient documentation

## 2019-07-25 DIAGNOSIS — D689 Coagulation defect, unspecified: Secondary | ICD-10-CM | POA: Insufficient documentation

## 2019-07-25 DIAGNOSIS — N189 Chronic kidney disease, unspecified: Secondary | ICD-10-CM | POA: Insufficient documentation

## 2019-07-25 DIAGNOSIS — Z23 Encounter for immunization: Secondary | ICD-10-CM | POA: Insufficient documentation

## 2019-07-31 ENCOUNTER — Other Ambulatory Visit: Payer: Self-pay | Admitting: *Deleted

## 2019-08-07 DIAGNOSIS — Z2839 Other underimmunization status: Secondary | ICD-10-CM | POA: Insufficient documentation

## 2019-08-21 ENCOUNTER — Inpatient Hospital Stay (HOSPITAL_COMMUNITY): Admission: RE | Admit: 2019-08-21 | Payer: Self-pay | Source: Ambulatory Visit

## 2019-08-22 ENCOUNTER — Other Ambulatory Visit: Payer: Self-pay

## 2019-08-22 ENCOUNTER — Encounter (HOSPITAL_COMMUNITY): Payer: Self-pay | Admitting: *Deleted

## 2019-08-22 NOTE — Progress Notes (Signed)
Patient states that he attempted to go for his Covid test today but was in the wrong line and by the time he got back around the procedural drive thru was closed.  He stated that he will be able to go for testing on 12/3 around 12 (after dialysis).

## 2019-08-22 NOTE — Progress Notes (Signed)
Pt denies SOB, chest pain, and being under the care of a cardiologist.Pt stated that PCP is Dr. Moreen Fowler. Pt denies having a stress test, echo and cardiac cath. Pt denies having an EKG and chest x ray within the last year. Pt made aware to stop taking vitamins, fish oil and herbal medications. Do not take any NSAIDs ie: Ibuprofen, Advil, Naproxen (Aleve), Motrin, BC and Goody Powder. Pt made aware to check CBG every 2 hours prior to arrival to hospital on DOS. Pt made aware to treat a CBG < 70 with 4 ounces of cranberry juice, wait 15 minutes after intervention to recheck BG, if BG remains < 70, call Short Stay unit to speak with a nurse. Pt verbalized understanding of all pre-op instructions.

## 2019-08-23 ENCOUNTER — Other Ambulatory Visit (HOSPITAL_COMMUNITY)
Admission: RE | Admit: 2019-08-23 | Discharge: 2019-08-23 | Disposition: A | Discharge status: Home / Self Care | Payer: Self-pay | Source: Ambulatory Visit | Attending: Vascular Surgery | Admitting: Vascular Surgery

## 2019-08-23 DIAGNOSIS — Z20828 Contact with and (suspected) exposure to other viral communicable diseases: Secondary | ICD-10-CM | POA: Insufficient documentation

## 2019-08-23 DIAGNOSIS — Z01812 Encounter for preprocedural laboratory examination: Secondary | ICD-10-CM | POA: Insufficient documentation

## 2019-08-23 LAB — SARS CORONAVIRUS 2 (TAT 6-24 HRS): SARS Coronavirus 2: NEGATIVE

## 2019-08-23 MED ORDER — DEXTROSE 5 % IV SOLN
3.0000 g | INTRAVENOUS | Status: AC
  Administered 2019-08-24: 3 g via INTRAVENOUS
  Filled 2019-08-23: qty 3000
  Filled 2019-08-23: qty 3

## 2019-08-24 ENCOUNTER — Ambulatory Visit (HOSPITAL_COMMUNITY): Payer: Self-pay | Admitting: Certified Registered Nurse Anesthetist

## 2019-08-24 ENCOUNTER — Other Ambulatory Visit: Payer: Self-pay

## 2019-08-24 ENCOUNTER — Ambulatory Visit (HOSPITAL_COMMUNITY)
Admission: RE | Admit: 2019-08-24 | Discharge: 2019-08-24 | Disposition: A | Discharge status: Home / Self Care | Payer: Self-pay | Attending: Vascular Surgery | Admitting: Vascular Surgery

## 2019-08-24 ENCOUNTER — Encounter (HOSPITAL_COMMUNITY)
Admission: RE | Disposition: A | Discharge status: Home / Self Care | Payer: Self-pay | Source: Home / Self Care | Attending: Vascular Surgery

## 2019-08-24 DIAGNOSIS — Z8249 Family history of ischemic heart disease and other diseases of the circulatory system: Secondary | ICD-10-CM | POA: Insufficient documentation

## 2019-08-24 DIAGNOSIS — Z79899 Other long term (current) drug therapy: Secondary | ICD-10-CM | POA: Insufficient documentation

## 2019-08-24 DIAGNOSIS — E1122 Type 2 diabetes mellitus with diabetic chronic kidney disease: Secondary | ICD-10-CM | POA: Insufficient documentation

## 2019-08-24 DIAGNOSIS — N186 End stage renal disease: Secondary | ICD-10-CM | POA: Insufficient documentation

## 2019-08-24 DIAGNOSIS — E785 Hyperlipidemia, unspecified: Secondary | ICD-10-CM | POA: Insufficient documentation

## 2019-08-24 DIAGNOSIS — Z833 Family history of diabetes mellitus: Secondary | ICD-10-CM | POA: Insufficient documentation

## 2019-08-24 DIAGNOSIS — Z7982 Long term (current) use of aspirin: Secondary | ICD-10-CM | POA: Insufficient documentation

## 2019-08-24 DIAGNOSIS — I120 Hypertensive chronic kidney disease with stage 5 chronic kidney disease or end stage renal disease: Secondary | ICD-10-CM | POA: Insufficient documentation

## 2019-08-24 DIAGNOSIS — N185 Chronic kidney disease, stage 5: Secondary | ICD-10-CM

## 2019-08-24 HISTORY — PX: AV FISTULA PLACEMENT: SHX1204

## 2019-08-24 HISTORY — DX: Presence of spectacles and contact lenses: Z97.3

## 2019-08-24 LAB — POCT I-STAT, CHEM 8
BUN: 54 mg/dL — ABNORMAL HIGH (ref 6–20)
Calcium, Ion: 1.04 mmol/L — ABNORMAL LOW (ref 1.15–1.40)
Chloride: 103 mmol/L (ref 98–111)
Creatinine, Ser: 10.7 mg/dL — ABNORMAL HIGH (ref 0.61–1.24)
Glucose, Bld: 123 mg/dL — ABNORMAL HIGH (ref 70–99)
HCT: 30 % — ABNORMAL LOW (ref 39.0–52.0)
Hemoglobin: 10.2 g/dL — ABNORMAL LOW (ref 13.0–17.0)
Potassium: 4.4 mmol/L (ref 3.5–5.1)
Sodium: 139 mmol/L (ref 135–145)
TCO2: 30 mmol/L (ref 22–32)

## 2019-08-24 LAB — GLUCOSE, CAPILLARY
Glucose-Capillary: 120 mg/dL — ABNORMAL HIGH (ref 70–99)
Glucose-Capillary: 99 mg/dL (ref 70–99)
Glucose-Capillary: 88 mg/dL (ref 70–99)

## 2019-08-24 SURGERY — ARTERIOVENOUS (AV) FISTULA CREATION
Anesthesia: General | Site: Arm Lower | Laterality: Left

## 2019-08-24 MED ORDER — PHENYLEPHRINE HCL-NACL 10-0.9 MG/250ML-% IV SOLN
INTRAVENOUS | Status: DC | PRN
  Administered 2019-08-24: 50 ug/min via INTRAVENOUS

## 2019-08-24 MED ORDER — OXYCODONE-ACETAMINOPHEN 5-325 MG PO TABS
1.0000 | ORAL_TABLET | Freq: Four times a day (QID) | ORAL | Status: DC | PRN
  Administered 2019-08-24: 1 via ORAL

## 2019-08-24 MED ORDER — PROMETHAZINE HCL 25 MG/ML IJ SOLN: 6.2500 mg | INTRAMUSCULAR | Status: DC | PRN

## 2019-08-24 MED ORDER — SODIUM CHLORIDE 0.9 % IV SOLN
INTRAVENOUS | Status: DC | PRN
  Administered 2019-08-24: 500 mL

## 2019-08-24 MED ORDER — ONDANSETRON HCL 4 MG/2ML IJ SOLN
INTRAMUSCULAR | Status: DC | PRN
  Administered 2019-08-24: 4 mg via INTRAVENOUS

## 2019-08-24 MED ORDER — PROPOFOL 10 MG/ML IV BOLUS
INTRAVENOUS | Status: AC
  Filled 2019-08-24: qty 20

## 2019-08-24 MED ORDER — OXYCODONE-ACETAMINOPHEN 5-325 MG PO TABS
ORAL_TABLET | ORAL | Status: AC
  Filled 2019-08-24: qty 1

## 2019-08-24 MED ORDER — FENTANYL CITRATE (PF) 100 MCG/2ML IJ SOLN: 25.0000 ug | INTRAMUSCULAR | Status: DC | PRN

## 2019-08-24 MED ORDER — MIDAZOLAM HCL 2 MG/2ML IJ SOLN
INTRAMUSCULAR | Status: AC
  Filled 2019-08-24: qty 2

## 2019-08-24 MED ORDER — SODIUM CHLORIDE 0.9 % IV SOLN
INTRAVENOUS | Status: AC
  Filled 2019-08-24: qty 1.2

## 2019-08-24 MED ORDER — PHENYLEPHRINE 40 MCG/ML (10ML) SYRINGE FOR IV PUSH (FOR BLOOD PRESSURE SUPPORT)
PREFILLED_SYRINGE | INTRAVENOUS | Status: DC | PRN
  Administered 2019-08-24: 80 ug via INTRAVENOUS

## 2019-08-24 MED ORDER — EPHEDRINE 5 MG/ML INJ
INTRAVENOUS | Status: AC
  Filled 2019-08-24: qty 10

## 2019-08-24 MED ORDER — PHENYLEPHRINE 40 MCG/ML (10ML) SYRINGE FOR IV PUSH (FOR BLOOD PRESSURE SUPPORT)
PREFILLED_SYRINGE | INTRAVENOUS | Status: AC
  Filled 2019-08-24: qty 10

## 2019-08-24 MED ORDER — FENTANYL CITRATE (PF) 100 MCG/2ML IJ SOLN
INTRAMUSCULAR | Status: DC | PRN
  Administered 2019-08-24: 100 ug via INTRAVENOUS

## 2019-08-24 MED ORDER — PAPAVERINE HCL 30 MG/ML IJ SOLN
INTRAMUSCULAR | Status: AC
  Filled 2019-08-24: qty 2

## 2019-08-24 MED ORDER — SODIUM CHLORIDE 0.9 % IV SOLN
INTRAVENOUS | Status: DC
  Administered 2019-08-24 (×2): via INTRAVENOUS

## 2019-08-24 MED ORDER — LIDOCAINE HCL (PF) 1 % IJ SOLN
INTRAMUSCULAR | Status: AC
  Filled 2019-08-24: qty 30

## 2019-08-24 MED ORDER — 0.9 % SODIUM CHLORIDE (POUR BTL) OPTIME
TOPICAL | Status: DC | PRN
  Administered 2019-08-24: 1000 mL

## 2019-08-24 MED ORDER — LIDOCAINE 2% (20 MG/ML) 5 ML SYRINGE
INTRAMUSCULAR | Status: DC | PRN
  Administered 2019-08-24: 100 mg via INTRAVENOUS

## 2019-08-24 MED ORDER — MIDAZOLAM HCL 5 MG/5ML IJ SOLN
INTRAMUSCULAR | Status: DC | PRN
  Administered 2019-08-24: 2 mg via INTRAVENOUS

## 2019-08-24 MED ORDER — PROPOFOL 10 MG/ML IV BOLUS
INTRAVENOUS | Status: DC | PRN
  Administered 2019-08-24: 190 mg via INTRAVENOUS

## 2019-08-24 MED ORDER — DEXAMETHASONE SODIUM PHOSPHATE 4 MG/ML IJ SOLN
INTRAMUSCULAR | Status: DC | PRN
  Administered 2019-08-24: 4 mg via INTRAVENOUS

## 2019-08-24 MED ORDER — CHLORHEXIDINE GLUCONATE 4 % EX LIQD: 60.0000 mL | Freq: Once | CUTANEOUS | Status: DC

## 2019-08-24 MED ORDER — EPHEDRINE SULFATE-NACL 50-0.9 MG/10ML-% IV SOSY
PREFILLED_SYRINGE | INTRAVENOUS | Status: DC | PRN
  Administered 2019-08-24 (×2): 5 mg via INTRAVENOUS

## 2019-08-24 MED ORDER — FENTANYL CITRATE (PF) 250 MCG/5ML IJ SOLN
INTRAMUSCULAR | Status: AC
  Filled 2019-08-24: qty 5

## 2019-08-24 MED ORDER — OXYCODONE-ACETAMINOPHEN 5-325 MG PO TABS: 1.0000 | ORAL_TABLET | Freq: Four times a day (QID) | ORAL | 0 refills | Status: DC | PRN

## 2019-08-24 MED ORDER — HEPARIN SODIUM (PORCINE) 1000 UNIT/ML IJ SOLN
INTRAMUSCULAR | Status: DC | PRN
  Administered 2019-08-24: 3000 [IU] via INTRAVENOUS

## 2019-08-24 SURGICAL SUPPLY — 39 items
ADH SKN CLS APL DERMABOND .7 (GAUZE/BANDAGES/DRESSINGS) ×1
AGENT HMST SPONGE THK3/8 (HEMOSTASIS)
ARMBAND PINK RESTRICT EXTREMIT (MISCELLANEOUS) ×3 IMPLANT
CANISTER SUCT 3000ML PPV (MISCELLANEOUS) ×2 IMPLANT
CLIP VESOCCLUDE MED 6/CT (CLIP) ×2 IMPLANT
CLIP VESOCCLUDE SM WIDE 6/CT (CLIP) ×2 IMPLANT
COVER PROBE W GEL 5X96 (DRAPES) ×2 IMPLANT
COVER WAND RF STERILE (DRAPES) ×1 IMPLANT
DECANTER SPIKE VIAL GLASS SM (MISCELLANEOUS) ×2 IMPLANT
DERMABOND ADVANCED (GAUZE/BANDAGES/DRESSINGS) ×1
DERMABOND ADVANCED .7 DNX12 (GAUZE/BANDAGES/DRESSINGS) ×1 IMPLANT
ELECT REM PT RETURN 9FT ADLT (ELECTROSURGICAL) ×2
ELECTRODE REM PT RTRN 9FT ADLT (ELECTROSURGICAL) ×1 IMPLANT
GLOVE BIO SURGEON STRL SZ7.5 (GLOVE) ×2 IMPLANT
GLOVE BIOGEL PI IND STRL 6.5 (GLOVE) IMPLANT
GLOVE BIOGEL PI IND STRL 7.0 (GLOVE) IMPLANT
GLOVE BIOGEL PI IND STRL 8 (GLOVE) ×1 IMPLANT
GLOVE BIOGEL PI INDICATOR 6.5 (GLOVE) ×2
GLOVE BIOGEL PI INDICATOR 7.0 (GLOVE) ×3
GLOVE BIOGEL PI INDICATOR 8 (GLOVE) ×1
GLOVE SURG SS PI 6.5 STRL IVOR (GLOVE) ×2 IMPLANT
GOWN STRL REUS W/ TWL LRG LVL3 (GOWN DISPOSABLE) ×2 IMPLANT
GOWN STRL REUS W/ TWL XL LVL3 (GOWN DISPOSABLE) ×2 IMPLANT
GOWN STRL REUS W/TWL LRG LVL3 (GOWN DISPOSABLE) ×4
GOWN STRL REUS W/TWL XL LVL3 (GOWN DISPOSABLE) ×4
HEMOSTAT SPONGE AVITENE ULTRA (HEMOSTASIS) IMPLANT
KIT BASIN OR (CUSTOM PROCEDURE TRAY) ×2 IMPLANT
KIT TURNOVER KIT B (KITS) ×2 IMPLANT
NS IRRIG 1000ML POUR BTL (IV SOLUTION) ×2 IMPLANT
PACK CV ACCESS (CUSTOM PROCEDURE TRAY) ×2 IMPLANT
PAD ARMBOARD 7.5X6 YLW CONV (MISCELLANEOUS) ×4 IMPLANT
SUT MNCRL AB 4-0 PS2 18 (SUTURE) ×2 IMPLANT
SUT PROLENE 6 0 BV (SUTURE) ×2 IMPLANT
SUT PROLENE 7 0 BV 1 (SUTURE) IMPLANT
SUT VIC AB 3-0 SH 27 (SUTURE) ×2
SUT VIC AB 3-0 SH 27X BRD (SUTURE) ×1 IMPLANT
TOWEL GREEN STERILE (TOWEL DISPOSABLE) ×2 IMPLANT
UNDERPAD 30X30 (UNDERPADS AND DIAPERS) ×2 IMPLANT
WATER STERILE IRR 1000ML POUR (IV SOLUTION) ×2 IMPLANT

## 2019-08-24 NOTE — Anesthesia Postprocedure Evaluation (Signed)
Anesthesia Post Note  Patient: Ray Bradley  Procedure(s) Performed: CREATION OF ARTERIOVENOUS (AV) FISTULA  LEFT ARM (Left Arm Lower)     Patient location during evaluation: PACU Anesthesia Type: General Level of consciousness: sedated Pain management: pain level controlled Vital Signs Assessment: post-procedure vital signs reviewed and stable Respiratory status: spontaneous breathing and respiratory function stable Cardiovascular status: stable Postop Assessment: no apparent nausea or vomiting Anesthetic complications: no    Last Vitals:  Vitals:   08/24/19 1230 08/24/19 1231  BP: (!) 172/91 (!) 172/91  Pulse: 60 (!) 59  Resp: 14 16  Temp: 36.6 C   SpO2: 100% 100%                   Sumedha Munnerlyn DANIEL

## 2019-08-24 NOTE — Op Note (Signed)
OPERATIVE NOTE   PROCEDURE: left radiocephaic arteriovenous fistula placement  PRE-OPERATIVE DIAGNOSIS: ESRD  POST-OPERATIVE DIAGNOSIS: same  SURGEON: Monica Martinez, MD  ASSISTANT(S): OR staff  ANESTHESIA: LMA  ESTIMATED BLOOD LOSS: Minimal  FINDING(S): 1.  Cephalic vein: 2.5 mm, acceptable 2.  Radial artery: 3 mm, disease free 3.  Venous outflow: palpable thrill  4.  Radial flow: palpable radial pulse  SPECIMEN(S):  none  INDICATIONS:   Ray Bradley is a 48 y.o. male who presents with ESRD and need for permanent hemodialysis access.  The patient is scheduled for left arm access procedure and likely radiocephalic arteriovenous fistula placement.  The patient is aware the risks include but are not limited to: bleeding, infection, steal syndrome, nerve damage, ischemic monomelic neuropathy, failure to mature, and need for additional procedures.  The patient is aware of the risks of the procedure and elects to proceed forward.  DESCRIPTION: After full informed written consent was obtained from the patient, the patient was brought back to the operating room and placed supine upon the operating table.  Prior to induction, the patient received IV antibiotics.   After obtaining adequate anesthesia, the patient was then prepped and draped in the standard fashion for a left arm access procedure.   I turned my attention first to identifying the patient's distal cephalic vein and radial artery.  Using SonoSite guidance, the location of these vessels were marked out on the skin.   I made a longitudinal incision at the level of the wrist and dissected through the subcutaneous tissue and fascia to gain exposure of the radial artery.  This was noted to be 3 mm in diameter externally.  This was dissected out proximally and distally and controlled with vessel loops .  I then dissected out the cephalic vein.  This was noted to be 2.5 mm in diameter externally.  The distal segment of the  vein was ligated with a  2-0 silk, and the vein was transected.  The proximal segment was interrogated with serial dilators.  The vein accepted up to a 3 mm dilator without any difficulty.  I then instilled the heparinized saline into the vein and clamped it.  The patient was given 3000 units IV heparin.  At this point, I reset my exposure of the radial artery and placed the artery under tension proximally and distally.  I made an arteriotomy with a #11 blade, and then I extended the arteriotomy with a Potts scissor.  I injected heparinized saline proximal and distal to this arteriotomy.  The vein was then sewn to the artery in an end-to-side configuration with a running stitch of 6-0 Prolene.  Prior to completing this anastomosis, I allowed the vein and artery to backbleed.  There was no evidence of clot from any vessels.  I completed the anastomosis in the usual fashion and then released all vessel loops and clamps.    There was a palpable thrill in the venous outflow, and there was a palpable radial pulse.  At this point, I irrigated out the surgical wound.  There was no further active bleeding.  The subcutaneous tissue was reapproximated with a running stitch of 3-0 Vicryl.  The skin was then reapproximated with a running subcuticular stitch of 4-0 Monocryl.  The skin was then cleaned, dried, and reinforced with Dermabond.  The patient tolerated this procedure well.   COMPLICATIONS: None  CONDITION: Stable  Monica Martinez, MD [facility] Office: [phone] Pager: [phone]  08/24/2019, 11:55  AM

## 2019-08-24 NOTE — H&P (Signed)
History and Physical Interval Note:  08/24/2019 9:31 AM  Ray Bradley  has presented today for surgery, with the diagnosis of chronic kidney disease.  The various methods of treatment have been discussed with the patient and family. After consideration of risks, benefits and other options for treatment, the patient has consented to  Procedure(s): ARTERIOVENOUS (AV) FISTULA CREATION LEFT ARM (Left) as a surgical intervention.  The patient's history has been reviewed, patient examined, no change in status, stable for surgery.  I have reviewed the patient's chart and labs.  Questions were answered to the patient's satisfaction.    Left arm AVF  [name]  Patient name: Ray Bradley          MRN: [id number]        DOB: [birth year]        Sex: male  REASON FOR CONSULT: Follow-up to discuss AV fistula access  HPI: Ray Bradley is a 48 y.o. male, with stage V chronic kidney disease that presents to further discuss hemodialysis access.  I previously saw him in January 2020 and we discussed options for access.  At the time he was not aware that he was going to require fistula placement and wanted to follow-up with his nephrologist Dr. [name].  He states he has since been seen at [facility] for another opinion and ultimately has accepted that he may need dialysis.  He is right-handed.  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  No previous fistula access.  No catheter at this time.  States he feels great overall.       Past Medical History:  Diagnosis Date  . Chest pain   . Chronic kidney disease   . HTN (hypertension)   . Hyperlipidemia   . Type II or unspecified type diabetes mellitus without mention of complication, not stated as uncontrolled          Past Surgical History:  Procedure Laterality Date  . CIRCUMCISION, NON-NEWBORN  2004  . FOOT SURGERY           Family History  Problem Relation Age of Onset  . Colon cancer Father   . Diabetes Other         family history  . Hypertension Other        family history  . Aneurysm Mother        died age 84, brain  . Hypertension Mother   . Hypertension Sister 78  . Hypertension Sister 84  . Hypertension Brother 84    SOCIAL HISTORY: Social History        Socioeconomic History  . Marital status: Single    Spouse name: Not on file  . Number of children: 3  . Years of education: Not on file  . Highest education level: Not on file  Occupational History  . Not on file  Social Needs  . Financial resource strain: Not on file  . Food insecurity    Worry: Not on file    Inability: Not on file  . Transportation needs    Medical: Not on file    Non-medical: Not on file  Tobacco Use  . Smoking status: Never Smoker  . Smokeless tobacco: Never Used  Substance and Sexual Activity  . Alcohol use: Yes    Comment: occasionally  . Drug use: No  . Sexual activity: Not on file  Lifestyle  . Physical activity    Days per week: Not on file    Minutes per session: Not on  file  . Stress: Not on file  Relationships  . Social Herbalist on phone: Not on file    Gets together: Not on file    Attends religious service: Not on file    Active member of club or organization: Not on file    Attends meetings of clubs or organizations: Not on file    Relationship status: Not on file  . Intimate partner violence    Fear of current or ex partner: Not on file    Emotionally abused: Not on file    Physically abused: Not on file    Forced sexual activity: Not on file  Other Topics Concern  . Not on file  Social History Narrative   Lives with wife and 3 children.      No Known Allergies        Current Outpatient Medications  Medication Sig Dispense Refill  . amLODipine (NORVASC) 10 MG tablet Take 10 mg by mouth daily.  5  . aspirin EC 81 MG tablet Take 1 tablet by mouth daily.    Marland Kitchen atorvastatin (LIPITOR) 20 MG tablet Take 20  mg by mouth daily.    . carvedilol (COREG) 25 MG tablet Take 25 mg by mouth 2 (two) times daily.    . furosemide (LASIX) 40 MG tablet Take 40 mg by mouth daily.    . hydrALAZINE (APRESOLINE) 100 MG tablet Take 100 mg by mouth 3 (three) times daily.    . hydrochlorothiazide (HYDRODIURIL) 25 MG tablet Take 25 mg by mouth daily.  5  . rosuvastatin (CRESTOR) 5 MG tablet Take 1 tablet by mouth daily.    Marland Kitchen telmisartan (MICARDIS) 80 MG tablet Take 80 mg by mouth daily.  0   No current facility-administered medications for this visit.     REVIEW OF SYSTEMS:  [X]  denotes positive finding, [ ]  denotes negative finding Cardiac  Comments:  Chest pain or chest pressure:    Shortness of breath upon exertion:    Short of breath when lying flat:    Irregular heart rhythm:        Vascular    Pain in calf, thigh, or hip brought on by ambulation:    Pain in feet at night that wakes you up from your sleep:     Blood clot in your veins:    Leg swelling:         Pulmonary    Oxygen at home:    Productive cough:     Wheezing:         Neurologic    Sudden weakness in arms or legs:     Sudden numbness in arms or legs:     Sudden onset of difficulty speaking or slurred speech:    Temporary loss of vision in one eye:     Problems with dizziness:         Gastrointestinal    Blood in stool:     Vomited blood:         Genitourinary    Burning when urinating:     Blood in urine:        Psychiatric    Major depression:         Hematologic    Bleeding problems:    Problems with blood clotting too easily:        Skin    Rashes or ulcers:        Constitutional    Fever or chills:  PHYSICAL EXAM:     Vitals:   07/03/19 1134 07/03/19 1141  BP: (!) 209/96 (!) 212/98  Pulse: 62 62  Resp: 16   Temp: 98.1 F (36.7 C)   TempSrc: Temporal   SpO2: 100%   Weight: 279 lb  (126.6 kg)   Height: 5\' 10"  (1.778 m)     GENERAL: The patient is a well-nourished male, in no acute distress. The vital signs are documented above. CARDIAC: There is a regular rate and rhythm.  VASCULAR:  2+ palpable radial pulse bilateral upper extremity 2+ palpable brachial pulse bilateral upper extremity Nice cephalic vein in left forearm visible PULMONARY: There is good air exchange bilaterally without wheezing or rales. ABDOMEN: Soft and non-tender with normal pitched bowel sounds.  MUSCULOSKELETAL: There are no major deformities or cyanosis. NEUROLOGIC: No focal weakness or paresthesias are detected.   DATA:   Arterial duplex again shows triphasic waveforms in bilateral upper extremities.  On the left he has a very nice cephalic vein from the distal forearm all the way up to the shoulder.  Assessment/Plan:  48 year old male with stage V chronic kidney disease that presents to further discuss dialysis access after initial evaluation in January.  He has since got a second opinion from a nephrologist at [facility] and is willing to proceed with fistula placement at this time.  Discussed plan for left arm AV fistula given he is right-handed.  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  Options would be a left radiocephalic versus brachiocephalic and will evaluate in the operating room with ultrasound.  Risk benefits discussed with the patient including risk of steal, failure to mature, bleeding, nerve injury etc.   [name], MD [facility] Office: [phone] Pager: [phone]

## 2019-08-24 NOTE — Anesthesia Preprocedure Evaluation (Signed)
Anesthesia Evaluation  Patient identified by MRN, date of birth, ID band Patient awake    Reviewed: Allergy & Precautions, NPO status , Patient's Chart, lab work & pertinent test results  Airway Mallampati: II  TM Distance: >3 FB Neck ROM: Full    Dental no notable dental hx.    Pulmonary neg pulmonary ROS,    Pulmonary exam normal breath sounds clear to auscultation       Cardiovascular hypertension, Normal cardiovascular exam Rhythm:Regular Rate:Normal     Neuro/Psych negative neurological ROS  negative psych ROS   GI/Hepatic negative GI ROS, Neg liver ROS,   Endo/Other  diabetes  Renal/GU ESRFRenal disease  negative genitourinary   Musculoskeletal negative musculoskeletal ROS (+)   Abdominal   Peds negative pediatric ROS (+)  Hematology negative hematology ROS (+)   Anesthesia Other Findings   Reproductive/Obstetrics negative OB ROS                             Anesthesia Physical Anesthesia Plan  ASA: III  Anesthesia Plan: General   Post-op Pain Management:    Induction: Intravenous  PONV Risk Score and Plan: 2 and Ondansetron, Dexamethasone and Treatment may vary due to age or medical condition  Airway Management Planned: LMA  Additional Equipment:   Intra-op Plan:   Post-operative Plan: Extubation in OR  Informed Consent: I have reviewed the patients History and Physical, chart, labs and discussed the procedure including the risks, benefits and alternatives for the proposed anesthesia with the patient or authorized representative who has indicated his/her understanding and acceptance.     Dental advisory given  Plan Discussed with: CRNA and Surgeon  Anesthesia Plan Comments:         Anesthesia Quick Evaluation

## 2019-08-24 NOTE — Transfer of Care (Signed)
Immediate Anesthesia Transfer of Care Note  Patient: Ray Bradley  Procedure(s) Performed: CREATION OF ARTERIOVENOUS (AV) FISTULA  LEFT ARM (Left Arm Lower)  Patient Location: PACU  Anesthesia Type:General  Level of Consciousness: awake, alert  and oriented  Airway & Oxygen Therapy: Patient Spontanous Breathing and Patient connected to face mask oxygen  Post-op Assessment: Report given to RN and Post -op Vital signs reviewed and stable  Post vital signs: Reviewed and stable  Last Vitals:  Vitals Value Taken Time  BP 164/79 08/24/19 1213  Temp 36.6 C 08/24/19 1205  Pulse 63 08/24/19 1214  Resp 10 08/24/19 1214  SpO2 100 % 08/24/19 1214  Vitals shown include unvalidated device data.  Last Pain:  Vitals:   08/24/19 1205  TempSrc:   PainSc: 0-No pain         Complications: No apparent anesthesia complications

## 2019-08-24 NOTE — Anesthesia Procedure Notes (Signed)
Procedure Name: LMA Insertion Date/Time: 08/24/2019 10:44 AM Performed by: Trinna Post., CRNA Pre-anesthesia Checklist: Patient identified, Emergency Drugs available, Suction available and Patient being monitored Patient Re-evaluated:Patient Re-evaluated prior to induction Oxygen Delivery Method: Circle system utilized Preoxygenation: Pre-oxygenation with 100% oxygen Induction Type: IV induction LMA: LMA inserted LMA Size: 5.0 Number of attempts: 1 Placement Confirmation: ETT inserted through vocal cords under direct vision,  positive ETCO2 and breath sounds checked- equal and bilateral Tube secured with: Tape Dental Injury: Teeth and Oropharynx as per pre-operative assessment  Comments: LMA inserted by Johna Sheriff

## 2019-08-24 NOTE — Discharge Instructions (Signed)
° °  [facility]  Discharge Instructions  AV Fistula or Graft Surgery for Dialysis Access  Please refer to the following instructions for your post-procedure care. Your surgeon or physician assistant will discuss any changes with you.  Activity  You may drive the day following your surgery, if you are comfortable and no longer taking prescription pain medication. Resume full activity as the soreness in your incision resolves.  Bathing/Showering  You may shower after you go home. Keep your incision dry for 48 hours. Do not soak in a bathtub, hot tub, or swim until the incision heals completely. You may not shower if you have a hemodialysis catheter.  Incision Care  Clean your incision with mild soap and water after 48 hours. Pat the area dry with a clean towel. You do not need a bandage unless otherwise instructed. Do not apply any ointments or creams to your incision. You may have skin glue on your incision. Do not peel it off. It will come off on its own in about one week. Your arm may swell a bit after surgery. To reduce swelling use pillows to elevate your arm so it is above your heart. Your doctor will tell you if you need to lightly wrap your arm with an ACE bandage.  Diet  Resume your normal diet. There are not special food restrictions following this procedure. In order to heal from your surgery, it is CRITICAL to get adequate nutrition. Your body requires vitamins, minerals, and protein. Vegetables are the best source of vitamins and minerals. Vegetables also provide the perfect balance of protein. Processed food has little nutritional value, so try to avoid this.  Medications  Resume taking all of your medications. If your incision is causing pain, you may take over-the counter pain relievers such as acetaminophen (Tylenol). If you were prescribed a stronger pain medication, please be aware these medications can cause nausea and constipation. Prevent  nausea by taking the medication with a snack or meal. Avoid constipation by drinking plenty of fluids and eating foods with high amount of fiber, such as fruits, vegetables, and grains.  Do not take Tylenol if you are taking prescription pain medications.  Follow up Your surgeon may want to see you in the office following your access surgery. If so, this will be arranged at the time of your surgery.  Please call us immediately for any of the following conditions:  Increased pain, redness, drainage (pus) from your incision site Fever of 101 degrees or higher Severe or worsening pain at your incision site Hand pain or numbness.  Reduce your risk of vascular disease:  Stop smoking. If you would like help, call [facility] at 1-800-QUIT-NOW 9040799734) or [place] at [place] your cholesterol Maintain a desired weight Control your diabetes Keep your blood pressure down  Dialysis  It will take several weeks to several months for your new dialysis access to be ready for use. Your surgeon will determine when it is okay to use it. Your nephrologist will continue to direct your dialysis. You can continue to use your Permcath until your new access is ready for use.   08/24/2019 Kyven Lutton [id number] [birth year]  Surgeon(s): [name], MD  Procedure(s): ARTERIOVENOUS (AV) FISTULA CREATION LEFT ARM  x Do not stick fistula for 12 weeks    If you have any questions, please call the office at [phone].

## 2019-08-25 ENCOUNTER — Encounter (HOSPITAL_COMMUNITY): Payer: Self-pay | Admitting: Vascular Surgery

## 2019-09-13 ENCOUNTER — Other Ambulatory Visit (HOSPITAL_COMMUNITY): Payer: Self-pay | Admitting: Nephrology

## 2019-09-13 DIAGNOSIS — Z992 Dependence on renal dialysis: Secondary | ICD-10-CM

## 2019-09-13 DIAGNOSIS — N186 End stage renal disease: Secondary | ICD-10-CM

## 2019-09-19 ENCOUNTER — Other Ambulatory Visit: Payer: Self-pay

## 2019-09-19 ENCOUNTER — Ambulatory Visit (HOSPITAL_COMMUNITY)
Admission: RE | Admit: 2019-09-19 | Discharge: 2019-09-19 | Disposition: A | Discharge status: Home / Self Care | Payer: Self-pay | Source: Ambulatory Visit | Attending: Nephrology | Admitting: Nephrology

## 2019-09-19 ENCOUNTER — Other Ambulatory Visit (HOSPITAL_COMMUNITY): Payer: Self-pay | Admitting: Nephrology

## 2019-09-19 DIAGNOSIS — N186 End stage renal disease: Secondary | ICD-10-CM | POA: Insufficient documentation

## 2019-09-19 DIAGNOSIS — Z4901 Encounter for fitting and adjustment of extracorporeal dialysis catheter: Secondary | ICD-10-CM | POA: Insufficient documentation

## 2019-09-19 HISTORY — PX: IR FLUORO GUIDE CV LINE RIGHT: IMG2283

## 2019-09-19 MED ORDER — CEFAZOLIN SODIUM-DEXTROSE 2-4 GM/100ML-% IV SOLN
2.0000 g | Freq: Once | INTRAVENOUS | Status: AC
  Administered 2019-09-19: 2 g via INTRAVENOUS
  Filled 2019-09-19: qty 100

## 2019-09-19 MED ORDER — LIDOCAINE HCL 1 % IJ SOLN
INTRAMUSCULAR | Status: DC | PRN
  Administered 2019-09-19: 10 mL

## 2019-09-19 MED ORDER — HEPARIN SODIUM (PORCINE) 1000 UNIT/ML IJ SOLN
INTRAMUSCULAR | Status: DC | PRN
  Administered 2019-09-19: 3800 [IU] via INTRAVENOUS

## 2019-09-19 MED ORDER — CHLORHEXIDINE GLUCONATE 4 % EX LIQD
CUTANEOUS | Status: AC
  Filled 2019-09-19: qty 15

## 2019-09-19 MED ORDER — LIDOCAINE HCL 1 % IJ SOLN
INTRAMUSCULAR | Status: AC
  Filled 2019-09-19: qty 20

## 2019-09-19 MED ORDER — HEPARIN SODIUM (PORCINE) 1000 UNIT/ML IJ SOLN
INTRAMUSCULAR | Status: AC
  Filled 2019-09-19: qty 1

## 2019-09-19 NOTE — Progress Notes (Signed)
IR.  Patient underwent an image-guided tunneled HD catheter exchange today in IR with Dr. Annamaria Boots.  Post-procedure, patient complaining of dyspnea per RN. Went to assess patient bedside. Patient awake and alert sitting in chair. States that he has "a lump in my throat" and that "this could be reflux".  Vitals: BP 158/66, HR 77, SpO2 100% on RA. Heart: RRR. Lungs: CTA bilaterally.  Patient stable for discharge. Advised patient to call PCP/come to ED if dyspnea worsens.   Bea Graff Chase Knebel, PA-C 09/19/2019, 3:30 PM

## 2019-10-08 ENCOUNTER — Telehealth (HOSPITAL_COMMUNITY): Payer: Self-pay

## 2019-10-08 ENCOUNTER — Other Ambulatory Visit: Payer: Self-pay

## 2019-10-08 DIAGNOSIS — N185 Chronic kidney disease, stage 5: Secondary | ICD-10-CM

## 2019-10-08 NOTE — Telephone Encounter (Signed)

## 2019-10-09 ENCOUNTER — Encounter: Payer: Self-pay | Admitting: Vascular Surgery

## 2019-10-09 ENCOUNTER — Other Ambulatory Visit: Payer: Self-pay

## 2019-10-09 ENCOUNTER — Ambulatory Visit (INDEPENDENT_AMBULATORY_CARE_PROVIDER_SITE_OTHER): Payer: Self-pay | Admitting: Vascular Surgery

## 2019-10-09 ENCOUNTER — Ambulatory Visit (HOSPITAL_COMMUNITY): Payer: Self-pay

## 2019-10-09 ENCOUNTER — Ambulatory Visit (HOSPITAL_COMMUNITY)
Admission: RE | Admit: 2019-10-09 | Discharge: 2019-10-09 | Disposition: A | Discharge status: Home / Self Care | Payer: BC Managed Care – PPO | Source: Ambulatory Visit | Attending: Vascular Surgery | Admitting: Vascular Surgery

## 2019-10-09 DIAGNOSIS — Z992 Dependence on renal dialysis: Secondary | ICD-10-CM

## 2019-10-09 DIAGNOSIS — N185 Chronic kidney disease, stage 5: Secondary | ICD-10-CM | POA: Diagnosis present

## 2019-10-09 DIAGNOSIS — N186 End stage renal disease: Secondary | ICD-10-CM | POA: Insufficient documentation

## 2019-10-09 NOTE — Progress Notes (Signed)
Patient name: Ray Bradley MRN: [id number] DOB: Jun 24, 1971 Sex: male  REASON FOR VISIT: Postop after left radiocephalic AV fistula  HPI: Ray Bradley is a 49 y.o. male with end-stage renal disease that presents for postop check after left radiocephalic AV fistula.  His fistula was placed on 08/24/2019.  He reports no issues postop.  Incision is healed.  No numbness or tingling in the hand.  He is back at work.  He is optimistic to get on the transplant list.  Past Medical History:  Diagnosis Date  . Chest pain   . Chronic kidney disease   . HTN (hypertension)   . Hyperlipidemia   . Type II or unspecified type diabetes mellitus without mention of complication, not stated as uncontrolled   . Wears glasses     Past Surgical History:  Procedure Laterality Date  . AV FISTULA PLACEMENT Left 08/24/2019   Procedure: CREATION OF ARTERIOVENOUS (AV) FISTULA  LEFT ARM;  Surgeon: [name], MD;  Location: [facility];  Service: Vascular;  Laterality: Left;  . CIRCUMCISION, NON-NEWBORN  2004  . COLONOSCOPY W/ BIOPSIES AND POLYPECTOMY    . EYE SURGERY    . FOOT SURGERY    . IR FLUORO GUIDE CV LINE RIGHT  07/23/2019  . IR FLUORO GUIDE CV LINE RIGHT  09/19/2019  . IR US GUIDE VASC ACCESS RIGHT  07/23/2019  . MULTIPLE TOOTH EXTRACTIONS      Family History  Problem Relation Age of Onset  . Colon cancer Father   . Diabetes Other        family history  . Hypertension Other        family history  . Aneurysm Mother        died age 17, brain  . Hypertension Mother   . Hypertension Sister 58  . Hypertension Sister 59  . Hypertension Brother 73    SOCIAL HISTORY: Social History   Tobacco Use  . Smoking status: Never Smoker  . Smokeless tobacco: Never Used  Substance Use Topics  . Alcohol use: Not Currently    No Known Allergies  Current Outpatient Medications  Medication Sig Dispense Refill  . amLODipine (NORVASC) 10 MG tablet Take 10 mg by mouth daily.  5  . aspirin EC  81 MG tablet Take 81 mg by mouth daily.     . carvedilol (COREG) 25 MG tablet Take 25 mg by mouth 2 (two) times daily.    . ferric citrate (AURYXIA) 1 GM 210 MG(Fe) tablet Take 210 mg by mouth 3 (three) times daily with meals.    Marland Kitchen losartan (COZAAR) 50 MG tablet Take 50 mg by mouth daily.    Marland Kitchen oxyCODONE-acetaminophen (PERCOCET) 5-325 MG tablet Take 1 tablet by mouth every 6 (six) hours as needed. 8 tablet 0  . rosuvastatin (CRESTOR) 5 MG tablet Take 5 mg by mouth daily.     . furosemide (LASIX) 80 MG tablet Take 80 mg by mouth 2 (two) times daily.     . hydrALAZINE (APRESOLINE) 100 MG tablet Take 100 mg by mouth 3 (three) times daily.     No current facility-administered medications for this visit.    REVIEW OF SYSTEMS:  [X]  denotes positive finding, [ ]  denotes negative finding Cardiac  Comments:  Chest pain or chest pressure:    Shortness of breath upon exertion:    Short of breath when lying flat:    Irregular heart rhythm:        Vascular    Pain  in calf, thigh, or hip brought on by ambulation:    Pain in feet at night that wakes you up from your sleep:     Blood clot in your veins:    Leg swelling:         Pulmonary    Oxygen at home:    Productive cough:     Wheezing:         Neurologic    Sudden weakness in arms or legs:     Sudden numbness in arms or legs:     Sudden onset of difficulty speaking or slurred speech:    Temporary loss of vision in one eye:     Problems with dizziness:         Gastrointestinal    Blood in stool:     Vomited blood:         Genitourinary    Burning when urinating:     Blood in urine:        Psychiatric    Major depression:         Hematologic    Bleeding problems:    Problems with blood clotting too easily:        Skin    Rashes or ulcers:        Constitutional    Fever or chills:      PHYSICAL EXAM: Vitals:   10/09/19 1238  BP: 123/76  Pulse: 69  Resp: 18  Temp: (!) 97.5 F (36.4 C)  TempSrc: Temporal  SpO2: 98%   Weight: 275 lb (124.7 kg)  Height: 5\' 10"  (1.778 m)    GENERAL: The patient is a well-nourished male, in no acute distress. The vital signs are documented above. CARDIAC: There is a regular rate and rhythm.  VASCULAR:  Excellent left radiocephalic thrill Palpable left radial pulse  DATA:   I independently reviewed his left arm fistula duplex and the radiocephalic fistula has good flow volumes and is over 6 mm and maturing nicely.  Assessment/Plan:  49 year old male with end-stage renal disease presents for postop check after left radiocephalic AV fistula.  Fistula has an excellent thrill on exam.  He has no steal symptoms.  It appears to be maturing nicely based on the fistula duplex today.  I would like to get him to 3 months postop before they can access the fistula for use to allow full maturation.  I have no intention of ligating side branches given that it feels excellent on exam and I am hopeful that it will work well once they start accessing it.  Follow-up PRN.   [name], MD [facility] Office: [phone]

## 2019-12-05 DIAGNOSIS — E8770 Fluid overload, unspecified: Secondary | ICD-10-CM | POA: Insufficient documentation

## 2019-12-13 DIAGNOSIS — R82998 Other abnormal findings in urine: Secondary | ICD-10-CM | POA: Insufficient documentation

## 2019-12-13 DIAGNOSIS — E785 Hyperlipidemia, unspecified: Secondary | ICD-10-CM | POA: Insufficient documentation

## 2019-12-13 DIAGNOSIS — E7849 Other hyperlipidemia: Secondary | ICD-10-CM | POA: Insufficient documentation

## 2019-12-13 DIAGNOSIS — K7689 Other specified diseases of liver: Secondary | ICD-10-CM | POA: Insufficient documentation

## 2019-12-14 DIAGNOSIS — Z7901 Long term (current) use of anticoagulants: Secondary | ICD-10-CM | POA: Insufficient documentation

## 2020-01-04 DIAGNOSIS — T82590D 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, subsequent encounter: Secondary | ICD-10-CM | POA: Insufficient documentation

## 2020-02-27 DIAGNOSIS — E13319 Other specified diabetes mellitus with unspecified diabetic retinopathy without macular edema: Secondary | ICD-10-CM | POA: Insufficient documentation

## 2020-03-13 DIAGNOSIS — E669 Obesity, unspecified: Secondary | ICD-10-CM | POA: Insufficient documentation

## 2020-03-31 DIAGNOSIS — T782XXA Anaphylactic shock, unspecified, initial encounter: Secondary | ICD-10-CM | POA: Insufficient documentation

## 2020-05-05 ENCOUNTER — Ambulatory Visit (INDEPENDENT_AMBULATORY_CARE_PROVIDER_SITE_OTHER): Payer: Medicare Other | Admitting: Family Medicine

## 2020-05-19 ENCOUNTER — Ambulatory Visit (INDEPENDENT_AMBULATORY_CARE_PROVIDER_SITE_OTHER): Payer: Self-pay | Admitting: Family Medicine

## 2020-05-28 DIAGNOSIS — E875 Hyperkalemia: Secondary | ICD-10-CM | POA: Insufficient documentation

## 2021-03-31 DIAGNOSIS — E441 Mild protein-calorie malnutrition: Secondary | ICD-10-CM | POA: Insufficient documentation

## 2021-04-06 DIAGNOSIS — E782 Mixed hyperlipidemia: Secondary | ICD-10-CM | POA: Insufficient documentation

## 2021-04-24 DIAGNOSIS — T7840XA Allergy, unspecified, initial encounter: Secondary | ICD-10-CM | POA: Insufficient documentation

## 2021-05-25 ENCOUNTER — Other Ambulatory Visit: Payer: Self-pay

## 2021-05-25 ENCOUNTER — Encounter: Payer: Self-pay | Admitting: Emergency Medicine

## 2021-05-25 ENCOUNTER — Ambulatory Visit
Admission: EM | Admit: 2021-05-25 | Discharge: 2021-05-25 | Disposition: A | Discharge status: Home / Self Care | Payer: Medicare Other | Attending: Internal Medicine | Admitting: Internal Medicine

## 2021-05-25 ENCOUNTER — Emergency Department (HOSPITAL_COMMUNITY): Payer: Medicare Other

## 2021-05-25 ENCOUNTER — Emergency Department (HOSPITAL_COMMUNITY)
Admission: EM | Admit: 2021-05-25 | Discharge: 2021-05-25 | Disposition: A | Discharge status: Home / Self Care | Payer: Medicare Other | Attending: Emergency Medicine | Admitting: Emergency Medicine

## 2021-05-25 DIAGNOSIS — I120 Hypertensive chronic kidney disease with stage 5 chronic kidney disease or end stage renal disease: Secondary | ICD-10-CM | POA: Insufficient documentation

## 2021-05-25 DIAGNOSIS — Z7982 Long term (current) use of aspirin: Secondary | ICD-10-CM | POA: Insufficient documentation

## 2021-05-25 DIAGNOSIS — E1122 Type 2 diabetes mellitus with diabetic chronic kidney disease: Secondary | ICD-10-CM | POA: Diagnosis not present

## 2021-05-25 DIAGNOSIS — N3090 Cystitis, unspecified without hematuria: Secondary | ICD-10-CM | POA: Diagnosis not present

## 2021-05-25 DIAGNOSIS — B9689 Other specified bacterial agents as the cause of diseases classified elsewhere: Secondary | ICD-10-CM | POA: Diagnosis not present

## 2021-05-25 DIAGNOSIS — Z79899 Other long term (current) drug therapy: Secondary | ICD-10-CM | POA: Insufficient documentation

## 2021-05-25 DIAGNOSIS — N186 End stage renal disease: Secondary | ICD-10-CM | POA: Insufficient documentation

## 2021-05-25 DIAGNOSIS — R109 Unspecified abdominal pain: Secondary | ICD-10-CM | POA: Diagnosis present

## 2021-05-25 DIAGNOSIS — M549 Dorsalgia, unspecified: Secondary | ICD-10-CM

## 2021-05-25 DIAGNOSIS — Z992 Dependence on renal dialysis: Secondary | ICD-10-CM | POA: Insufficient documentation

## 2021-05-25 LAB — BASIC METABOLIC PANEL
Anion gap: 16 — ABNORMAL HIGH (ref 5–15)
BUN: 61 mg/dL — ABNORMAL HIGH (ref 6–20)
CO2: 25 mmol/L (ref 22–32)
Calcium: 9 mg/dL (ref 8.9–10.3)
Chloride: 92 mmol/L — ABNORMAL LOW (ref 98–111)
Creatinine, Ser: 11.05 mg/dL — ABNORMAL HIGH (ref 0.61–1.24)
GFR, Estimated: 5 mL/min — ABNORMAL LOW (ref >60)
Glucose, Bld: 148 mg/dL — ABNORMAL HIGH (ref 70–99)
Potassium: 4.1 mmol/L (ref 3.5–5.1)
Sodium: 133 mmol/L — ABNORMAL LOW (ref 135–145)

## 2021-05-25 LAB — URINALYSIS, ROUTINE W REFLEX MICROSCOPIC
Bilirubin Urine: NEGATIVE
Glucose, UA: 50 mg/dL — AB
Ketones, ur: NEGATIVE mg/dL
Nitrite: NEGATIVE
Protein, ur: 100 mg/dL — AB
RBC / HPF: >50 RBC/hpf — ABNORMAL HIGH (ref 0–5)
Specific Gravity, Urine: 1.005 (ref 1.005–1.030)
WBC, UA: >50 WBC/hpf — ABNORMAL HIGH (ref 0–5)
pH: 6 (ref 5.0–8.0)

## 2021-05-25 LAB — POCT URINALYSIS DIP (MANUAL ENTRY)
Bilirubin, UA: NEGATIVE
Glucose, UA: 100 mg/dL — AB
Ketones, POC UA: NEGATIVE mg/dL
Nitrite, UA: NEGATIVE
Protein Ur, POC: 100 mg/dL — AB
Spec Grav, UA: 1.015 (ref 1.010–1.025)
Urobilinogen, UA: 0.2 E.U./dL
pH, UA: 6 (ref 5.0–8.0)

## 2021-05-25 LAB — CBC
HCT: 34.3 % — ABNORMAL LOW (ref 39.0–52.0)
Hemoglobin: 11 g/dL — ABNORMAL LOW (ref 13.0–17.0)
MCH: 27.4 pg (ref 26.0–34.0)
MCHC: 32.1 g/dL (ref 30.0–36.0)
MCV: 85.5 fL (ref 80.0–100.0)
Platelets: 179 10*3/uL (ref 150–400)
RBC: 4.01 MIL/uL — ABNORMAL LOW (ref 4.22–5.81)
RDW: 15 % (ref 11.5–15.5)
WBC: 7.4 10*3/uL (ref 4.0–10.5)
nRBC: 0 % (ref 0.0–0.2)

## 2021-05-25 MED ORDER — CEPHALEXIN 250 MG PO CAPS: 500.0000 mg | ORAL_CAPSULE | Freq: Once | ORAL | Status: DC

## 2021-05-25 MED ORDER — CEPHALEXIN 250 MG PO CAPS
500.0000 mg | ORAL_CAPSULE | ORAL | Status: DC
  Administered 2021-05-25: 500 mg via ORAL
  Filled 2021-05-25: qty 2

## 2021-05-25 MED ORDER — CEPHALEXIN 500 MG PO CAPS: 500.0000 mg | ORAL_CAPSULE | Freq: Every day | ORAL | 0 refills | Status: AC

## 2021-05-25 NOTE — Discharge Instructions (Addendum)
You have been given the first dose of the Rx today in the department.  Please pick up the remainder at the [facility].  You should take 1 dose for the next 4 days and follow-up with your primary care provider if your symptoms do not improve or worsen.

## 2021-05-25 NOTE — Discharge Instructions (Addendum)
Please go the hospital as soon as you leave urgent care for further evaluation and management.

## 2021-05-25 NOTE — ED Provider Notes (Signed)
With [facility] Provider Note   [name]: [id number] Arrival date & time: 05/25/21  1046     History Chief Complaint  Patient presents with   Flank Pain    Ray Bradley is a 50 y.o. male with PMH of HTN and CKD on 4x/wk at-home hemodialysis presenting with a complaint of right flank pain since Friday.  Patient states that this pain comes and goes and feels similar to his past kidney infections.  Pain seemed to become more severe after his Saturday dialysis session. Patient reports that he does still make urine and on average urinates once a day.  Denies any dysuria or hematuria.  No fevers, chills.  Reports 1 episode of emesis yesterday however denies current nausea or abdominal pain.  Patient presented to urgent care earlier today and they sent him to the ED because they did not feel as though they could thoroughly assess him as a dialysis patient.  No history of kidney stones. Next dialysis scheduled for today.   Past Medical History:  Diagnosis Date   Chest pain    Chronic kidney disease    HTN (hypertension)    Hyperlipidemia    Type II or unspecified type diabetes mellitus without mention of complication, not stated as uncontrolled    Wears glasses     Patient Active Problem List   Diagnosis Date Noted   ESRD on dialysis ([facility]) 10/09/2019   CKD (chronic kidney disease) stage 5, GFR less than 15 ml/min ([facility]) 10/17/2018   HTN (hypertension)    Diabetes ([facility])    Chest pain     Past Surgical History:  Procedure Laterality Date   AV FISTULA PLACEMENT Left 08/24/2019   Procedure: CREATION OF ARTERIOVENOUS (AV) FISTULA  LEFT ARM;  Surgeon: [name], MD;  Location: [facility];  Service: Vascular;  Laterality: Left;   CIRCUMCISION, NON-NEWBORN  2004   COLONOSCOPY W/ BIOPSIES AND POLYPECTOMY     EYE SURGERY     FOOT SURGERY     IR FLUORO GUIDE CV LINE RIGHT  07/23/2019   IR FLUORO GUIDE CV LINE RIGHT  09/19/2019   IR US GUIDE VASC  ACCESS RIGHT  07/23/2019   MULTIPLE TOOTH EXTRACTIONS         Family History  Problem Relation Age of Onset   Colon cancer Father    Diabetes Other        family history   Hypertension Other        family history   Aneurysm Mother        died age 58, brain   Hypertension Mother    Hypertension Sister 44   Hypertension Sister 73   Hypertension Brother 60    Social History   Tobacco Use   Smoking status: Never   Smokeless tobacco: Never  Vaping Use   Vaping Use: Never used  Substance Use Topics   Alcohol use: Not Currently   Drug use: No    Home Medications Prior to Admission medications   Medication Sig Start Date End Date Taking? Authorizing Provider  amLODipine (NORVASC) 10 MG tablet Take 10 mg by mouth daily. 04/15/15  Yes [provider]  aspirin EC 81 MG tablet Take 81 mg by mouth daily.    Yes [provider]  carvedilol (COREG) 12.5 MG tablet Take 12.5 mg by mouth 2 (two) times daily with a meal.   Yes [provider]  carvedilol (COREG) 25 MG tablet Take 25 mg by mouth  2 (two) times daily. 08/20/18  Yes [provider]  ferric citrate (AURYXIA) 1 GM 210 MG(Fe) tablet Take 210 mg by mouth 3 (three) times daily with meals.   Yes [provider]  heparin sodium, porcine, 1000 UNIT/ML injection Inject 2,000 Units into the vein See admin instructions. Patient states he receive 4 times a week per patient, no specific days. 02/04/20  Yes [provider]  losartan (COZAAR) 50 MG tablet Take 50 mg by mouth daily. 10/02/19  Yes [provider]  rosuvastatin (CRESTOR) 5 MG tablet Take 5 mg by mouth daily.  08/20/18  Yes [provider]  oxyCODONE-acetaminophen (PERCOCET) 5-325 MG tablet Take 1 tablet by mouth every 6 (six) hours as needed. Patient not taking: Reported on 05/25/2021 08/24/19   Gabriel Earing, PA-C    Allergies    Patient has no known allergies.  Review of Systems   Review of Systems   Constitutional:  Negative for chills and fever.  Gastrointestinal:  Positive for vomiting. Negative for abdominal pain and nausea.  Genitourinary:  Positive for flank pain. Negative for difficulty urinating, dysuria and hematuria.  Musculoskeletal:  Positive for back pain. Negative for neck pain.  Neurological:  Negative for dizziness, syncope and headaches.  All other systems reviewed and are negative.  Physical Exam Updated Vital Signs BP (!) 167/81   Pulse (!) 59   Resp 16   SpO2 99%   Physical Exam Vitals and nursing note reviewed.  Constitutional:      General: He is not in acute distress.    Appearance: Normal appearance. He is not ill-appearing.  HENT:     Head: Normocephalic and atraumatic.  Eyes:     General: No scleral icterus.    Conjunctiva/sclera: Conjunctivae normal.  Cardiovascular:     Rate and Rhythm: Normal rate and regular rhythm.  Pulmonary:     Effort: Pulmonary effort is normal. No respiratory distress.     Breath sounds: Normal breath sounds.     Comments: No rales noted. Abdominal:     General: Abdomen is flat.     Palpations: Abdomen is soft.     Tenderness: There is no abdominal tenderness. There is no right CVA tenderness or left CVA tenderness.  Musculoskeletal:        General: Normal range of motion.     Cervical back: Normal range of motion.  Skin:    General: Skin is warm and dry.     Findings: No rash.  Neurological:     Mental Status: He is alert.     Gait: Gait normal.  Psychiatric:        Mood and Affect: Mood normal.        Behavior: Behavior normal.    ED Results / Procedures / Treatments   Labs (all labs ordered are listed, but only abnormal results are displayed) Labs Reviewed  BASIC METABOLIC PANEL - Abnormal; Notable for the following components:      Result Value   Sodium 133 (*)    Chloride 92 (*)    Glucose, Bld 148 (*)    BUN 61 (*)    Creatinine, Ser 11.05 (*)    GFR, Estimated 5 (*)    Anion gap 16 (*)     All other components within normal limits  CBC - Abnormal; Notable for the following components:   RBC 4.01 (*)    Hemoglobin 11.0 (*)    HCT 34.3 (*)    All other components within  normal limits  URINALYSIS, ROUTINE W REFLEX MICROSCOPIC    EKG None  Radiology CT Renal Stone Study  Result Date: 05/25/2021 CLINICAL DATA:  Right flank pain for the last several days EXAM: CT ABDOMEN AND PELVIS WITHOUT CONTRAST TECHNIQUE: Multidetector CT imaging of the abdomen and pelvis was performed following the standard protocol without IV contrast. COMPARISON:  None. FINDINGS: Lower chest: No acute abnormality. Evaluation of the abdominal viscera limited by the lack of IV contrast. Hepatobiliary: No focal liver abnormality is seen. Normal appearance of the gallbladder. Pancreas: Unremarkable. No surrounding inflammatory changes. Spleen: Normal in size without focal abnormality. Adrenals/Urinary Tract: Adrenal glands are unremarkable. There is mild fullness of the right collecting system with perinephric fat stranding. No renal calculi identified. No large renal mass bilaterally. The urinary bladder appears thick walled. Stomach/Bowel: Stomach is within normal limits. Appendix appears normal. No evidence of bowel wall thickening, distention, or inflammatory changes. Vascular/Lymphatic: No significant vascular findings are present. No enlarged abdominal or pelvic lymph nodes. Reproductive: Prostate is unremarkable. Other: No abdominal wall hernia or abnormality. No abdominopelvic ascites. Musculoskeletal: No acute or significant osseous findings. IMPRESSION: There is mild fullness of the right collecting system with perinephric fat stranding. No renal calculi identified. The urinary bladder appears thick walled. Findings raise concern for possible cystitis with upper tract infection, versus a recently passed stone. Electronically Signed   By: Audie Pinto M.D.   On: 05/25/2021 13:57    Procedures Procedures    Medications Ordered in ED Medications  cephALEXin (KEFLEX) capsule 500 mg (500 mg Oral Not Given 05/25/21 1549)    ED Course  I have reviewed the triage vital signs and the nursing notes.  Pertinent labs & imaging results that were available during my care of the patient were reviewed by me and considered in my medical decision making (see chart for details).    MDM Rules/Calculators/A&P Torben Krawiec is a 50 y.o. male with PMH of HTN and CKD on 4x/wk at-home hemodialysis presenting with a complaint of right flank pain since Friday.  Patient states that this pain comes and goes and feels similar to his past kidney infections.  Pain seemed to become more severe after his Saturday dialysis session. Patient reports that he does still make urine and on average urinates once a day.  Denies any dysuria or hematuria.  No fevers, chills. Patient presented to urgent care earlier today and they sent him to the ED because they did not feel as though they could thoroughly assess him as a dialysis patient.  No history of kidney stones. Next dialysis scheduled for today.  Patient no acute distress today.  Lab work stable from previous assessments.  Vital signs revealed elevated blood pressure however this pressure noted to be around patient's baseline.  He denies any pain at the time of evaluation.  No signs of systemic infection. His abdominal exam was WNL with no tenderness or guarding.  Negative CVA tenderness bilaterally.  With patient's history I felt appropriate to order a CT renal study.  Imaging revealed no renal calculi however reported a possibility of cystitis with upper tract infection or potentially a recently passed stone. He was able to urinate in our department today and this urinalysis positive for WBC, RBC and Hgb. Few bacteria.  Spoke with pharmacy and decided to treat him for urinary tract infection.  Because he is on hemodialysis I allow pharmacy to dose his medication.  500 mg daily for 5  days. I will discharge the patient  home and instruct him to continue on his dialysis schedule.  Return precautions discussed at length.   Final Clinical Impression(s) / ED Diagnoses Final diagnoses:  Cystitis    Rx / DC Orders Results and diagnoses were explained to the patient. Return precautions discussed in full. Patient had no additional questions and expressed complete understanding.     Darliss Ridgel 05/25/21 1604    Davonna Belling, MD 05/26/21 [phone]

## 2021-05-25 NOTE — ED Provider Notes (Signed)
[name]    [name]: [id number] Arrival date & time: 05/25/21  0851      History   Chief Complaint Chief Complaint  Patient presents with   Back Pain    HPI Ray Bradley is a 50 y.o. male.   Patient presents with right-sided lower back pain that has been present for approximately 3 to 4 days.  Denies any recent or past back injury.  Patient has also been endorsing nausea and vomiting that occurred yesterday.  Denies any urinary frequency, urinary burning,abdominal pain, penile discharge, fever, chills, known exposure to STD.  Patient states that he is concerned for "kidney infection" because the symptoms are similar to a kidney infection that occurred in the past.  Patient does have chronic kidney disease and is on dialysis.  Patient has not had any urine production since dialysis occurred a few days prior.   Back Pain  Past Medical History:  Diagnosis Date   Chest pain    Chronic kidney disease    HTN (hypertension)    Hyperlipidemia    Type II or unspecified type diabetes mellitus without mention of complication, not stated as uncontrolled    Wears glasses     Patient Active Problem List   Diagnosis Date Noted   ESRD on dialysis ([facility]) 10/09/2019   CKD (chronic kidney disease) stage 5, GFR less than 15 ml/min ([facility]) 10/17/2018   HTN (hypertension)    Diabetes ([facility])    Chest pain     Past Surgical History:  Procedure Laterality Date   AV FISTULA PLACEMENT Left 08/24/2019   Procedure: CREATION OF ARTERIOVENOUS (AV) FISTULA  LEFT ARM;  Surgeon: [name], MD;  Location: [facility];  Service: Vascular;  Laterality: Left;   CIRCUMCISION, NON-NEWBORN  2004   COLONOSCOPY W/ BIOPSIES AND POLYPECTOMY     EYE SURGERY     FOOT SURGERY     IR FLUORO GUIDE CV LINE RIGHT  07/23/2019   IR FLUORO GUIDE CV LINE RIGHT  09/19/2019   IR US GUIDE VASC ACCESS RIGHT  07/23/2019   MULTIPLE TOOTH EXTRACTIONS         Home Medications    Prior to Admission  medications   Medication Sig Start Date End Date Taking? Authorizing Provider  amLODipine (NORVASC) 10 MG tablet Take 10 mg by mouth daily. 04/15/15  Yes [provider]  aspirin EC 81 MG tablet Take 81 mg by mouth daily.    Yes [provider]  carvedilol (COREG) 25 MG tablet Take 25 mg by mouth 2 (two) times daily. 08/20/18  Yes [provider]  ferric citrate (AURYXIA) 1 GM 210 MG(Fe) tablet Take 210 mg by mouth 3 (three) times daily with meals.   Yes [provider]  losartan (COZAAR) 50 MG tablet Take 50 mg by mouth daily. 10/02/19  Yes [provider]  rosuvastatin (CRESTOR) 5 MG tablet Take 5 mg by mouth daily.  08/20/18  Yes [provider]  furosemide (LASIX) 80 MG tablet Take 80 mg by mouth 2 (two) times daily.  10/04/18   [provider]  hydrALAZINE (APRESOLINE) 100 MG tablet Take 100 mg by mouth 3 (three) times daily. 10/04/18   [provider]  oxyCODONE-acetaminophen (PERCOCET) 5-325 MG tablet Take 1 tablet by mouth every 6 (six) hours as needed. 08/24/19   Gabriel Earing, PA-C    Family History Family History  Problem Relation Age of Onset   Colon cancer Father    Diabetes Other  family history   Hypertension Other        family history   Aneurysm Mother        died age 74, brain   Hypertension Mother    Hypertension Sister 22   Hypertension Sister 77   Hypertension Brother 3    Social History Social History   Tobacco Use   Smoking status: Never   Smokeless tobacco: Never  Vaping Use   Vaping Use: Never used  Substance Use Topics   Alcohol use: Not Currently   Drug use: No     Allergies   Patient has no known allergies.   Review of Systems Review of Systems Per HPI  Physical Exam Triage Vital Signs ED Triage Vitals  Enc Vitals Group     BP 05/25/21 0937 (!) 175/76     Pulse Rate 05/25/21 0937 61     Resp 05/25/21 0937 20     Temp 05/25/21 0937 98.3 F (36.8 C)      Temp Source 05/25/21 0937 Oral     SpO2 05/25/21 0937 98 %     Weight --      Height --      Head Circumference --      Peak Flow --      Pain Score 05/25/21 0947 8     Pain Loc --      Pain Edu? --      Excl. in [place]? --    No data found.  Updated Vital Signs BP (!) 175/76 (BP Location: Right Arm)   Pulse 61   Temp 98.3 F (36.8 C) (Oral)   Resp 20   SpO2 98%   Visual Acuity Right Eye Distance:   Left Eye Distance:   Bilateral Distance:    Right Eye Near:   Left Eye Near:    Bilateral Near:     Physical Exam Constitutional:      Appearance: Normal appearance.  HENT:     Head: Normocephalic and atraumatic.  Eyes:     Extraocular Movements: Extraocular movements intact.     Conjunctiva/sclera: Conjunctivae normal.  Cardiovascular:     Rate and Rhythm: Normal rate and regular rhythm.     Pulses: Normal pulses.     Heart sounds: Normal heart sounds.  Pulmonary:     Effort: Pulmonary effort is normal. No respiratory distress.     Breath sounds: Normal breath sounds.  Abdominal:     General: Bowel sounds are normal. There is no distension.     Palpations: Abdomen is soft.     Tenderness: There is no abdominal tenderness.  Musculoskeletal:     Cervical back: Normal.     Thoracic back: Normal.     Lumbar back: Normal.  Skin:    General: Skin is warm and dry.  Neurological:     General: No focal deficit present.     Mental Status: He is alert and oriented to person, place, and time. Mental status is at baseline.  Psychiatric:        Mood and Affect: Mood normal.        Behavior: Behavior normal.        Thought Content: Thought content normal.        Judgment: Judgment normal.     UC Treatments / Results  Labs (all labs ordered are listed, but only abnormal results are displayed) Labs Reviewed  POCT URINALYSIS DIP (MANUAL ENTRY) - Abnormal; Notable for the following components:  Result Value   Glucose, UA =100 (*)    Blood, UA moderate (*)    Protein  Ur, POC =100 (*)    Leukocytes, UA Trace (*)    All other components within normal limits  URINE CULTURE    EKG   Radiology No results found.  Procedures Procedures (including critical care time)  Medications Ordered in UC Medications - No data to display  Initial Impression / Assessment and Plan / UC Course  I have reviewed the triage vital signs and the nursing notes.  Pertinent labs & imaging results that were available during my care of the patient were reviewed by me and considered in my medical decision making (see chart for details).     Urinalysis was not definitive for urinary tract infection but did show trace leukocytes.  Advised patient that he needs to be further evaluated and managed at the hospital due to history of chronic kidney disease on dialysis and current symptoms.  Patient wishes to have urine culture sent because he thinks that he may not be able to provide a urine sample at the hospital.  Urine culture is pending.  Vital signs are stable.  Agree with patient self transport to the hospital.  Patient was agreeable with plan. Final Clinical Impressions(s) / UC Diagnoses   Final diagnoses:  Other acute back pain     Discharge Instructions      Please go the hospital as soon as you leave urgent care for further evaluation and management.     ED Prescriptions   None    PDMP not reviewed this encounter.   Odis Luster, FNP 05/25/21 1008

## 2021-05-25 NOTE — ED Triage Notes (Signed)
Pt with R flank pain x 2 days. One episode of vomiting yesterday, none since. Denies urinary symptoms. Sent here from UC for further work up d/t being a dialysis patient.

## 2021-05-25 NOTE — ED Triage Notes (Signed)
On hemodialysis. Began experiencing lower back pain 3-4 days prior, has now moved up his back. Reports nausea and vomiting yesterday. States the last time this happened it was a kidney infection. States he hasn't urinated in a few days due to dialysis. Denies fever, abdominal pain, dysuria.

## 2021-05-27 LAB — URINE CULTURE: Culture: NO GROWTH

## 2021-06-15 ENCOUNTER — Telehealth: Payer: Self-pay | Admitting: *Deleted

## 2021-06-15 ENCOUNTER — Other Ambulatory Visit: Payer: Self-pay

## 2021-06-15 ENCOUNTER — Ambulatory Visit (INDEPENDENT_AMBULATORY_CARE_PROVIDER_SITE_OTHER): Payer: Medicare Other | Admitting: Podiatry

## 2021-06-15 ENCOUNTER — Ambulatory Visit (INDEPENDENT_AMBULATORY_CARE_PROVIDER_SITE_OTHER): Payer: Medicare Other

## 2021-06-15 DIAGNOSIS — L97522 Non-pressure chronic ulcer of other part of left foot with fat layer exposed: Secondary | ICD-10-CM

## 2021-06-15 DIAGNOSIS — E1149 Type 2 diabetes mellitus with other diabetic neurological complication: Secondary | ICD-10-CM | POA: Diagnosis not present

## 2021-06-15 DIAGNOSIS — S91301A Unspecified open wound, right foot, initial encounter: Secondary | ICD-10-CM

## 2021-06-15 DIAGNOSIS — S91309A Unspecified open wound, unspecified foot, initial encounter: Secondary | ICD-10-CM

## 2021-06-15 MED ORDER — DOXYCYCLINE HYCLATE 100 MG PO TABS: 100.0000 mg | ORAL_TABLET | Freq: Two times a day (BID) | ORAL | 0 refills | Status: DC

## 2021-06-15 MED ORDER — SANTYL 250 UNIT/GM EX OINT: 1.0000 "application " | TOPICAL_OINTMENT | Freq: Every day | CUTANEOUS | 0 refills | Status: DC

## 2021-06-15 NOTE — Telephone Encounter (Signed)
Patient is calling because the foot cream prescribed is too expensive(300.00), is there an alternative?Please advise

## 2021-06-15 NOTE — Progress Notes (Signed)
Subjective:   Patient ID: Ray Bradley, male   DOB: 50 y.o.   MRN: [id number]   HPI 50 year old male presents the office today for concerns of blisters on both of his feet with the left side much worse than the right side.  He states the blisters opened and now he has wound on the bottom of his foot.  States he first noticed this on September 15 he saw his primary care physician September 21.  He does get some clear drainage but no purulence.  He is diabetic his last A1c was 6.2.  He has no fevers or chills that he reports or any other systemic symptoms.  No other concerns.   Review of Systems  All other systems reviewed and are negative.  Past Medical History:  Diagnosis Date   Chest pain    Chronic kidney disease    HTN (hypertension)    Hyperlipidemia    Type II or unspecified type diabetes mellitus without mention of complication, not stated as uncontrolled    Wears glasses     Past Surgical History:  Procedure Laterality Date   AV FISTULA PLACEMENT Left 08/24/2019   Procedure: CREATION OF ARTERIOVENOUS (AV) FISTULA  LEFT ARM;  Surgeon: [name], MD;  Location: [facility];  Service: Vascular;  Laterality: Left;   CIRCUMCISION, NON-NEWBORN  2004   COLONOSCOPY W/ BIOPSIES AND POLYPECTOMY     EYE SURGERY     FOOT SURGERY     IR FLUORO GUIDE CV LINE RIGHT  07/23/2019   IR FLUORO GUIDE CV LINE RIGHT  09/19/2019   IR US GUIDE VASC ACCESS RIGHT  07/23/2019   MULTIPLE TOOTH EXTRACTIONS       Current Outpatient Medications:    collagenase (SANTYL) ointment, Apply 1 application topically daily., Disp: 30 g, Rfl: 0   doxycycline (VIBRA-TABS) 100 MG tablet, Take 1 tablet (100 mg total) by mouth 2 (two) times daily., Disp: 14 tablet, Rfl: 0   amLODipine (NORVASC) 10 MG tablet, Take 10 mg by mouth daily., Disp: , Rfl: 5   aspirin EC 81 MG tablet, Take 81 mg by mouth daily. , Disp: , Rfl:    carvedilol (COREG) 12.5 MG tablet, Take 12.5 mg by mouth 2 (two) times daily with a meal.,  Disp: , Rfl:    carvedilol (COREG) 25 MG tablet, Take 25 mg by mouth 2 (two) times daily., Disp: , Rfl:    ferric citrate (AURYXIA) 1 GM 210 MG(Fe) tablet, Take 210 mg by mouth 3 (three) times daily with meals., Disp: , Rfl:    heparin sodium, porcine, 1000 UNIT/ML injection, Inject 2,000 Units into the vein See admin instructions. Patient states he receive 4 times a week per patient, no specific days., Disp: , Rfl:    levofloxacin (LEVAQUIN) 250 MG tablet, Take 250 mg by mouth daily., Disp: , Rfl:    losartan (COZAAR) 50 MG tablet, Take 50 mg by mouth daily., Disp: , Rfl:    mupirocin ointment (BACTROBAN) 2 %, Apply topically., Disp: , Rfl:    nystatin ointment (MYCOSTATIN), SMARTSIG:Sparingly Topical Daily, Disp: , Rfl:    oxyCODONE-acetaminophen (PERCOCET) 5-325 MG tablet, Take 1 tablet by mouth every 6 (six) hours as needed. (Patient not taking: Reported on 05/25/2021), Disp: 8 tablet, Rfl: 0   rosuvastatin (CRESTOR) 5 MG tablet, Take 5 mg by mouth daily. , Disp: , Rfl:    silver sulfADIAZINE (SILVADENE) 1 % cream, Apply 1 application topically daily., Disp: 50 g, Rfl: 0  No Known  Allergies        Objective:  Physical Exam  General: AAO x3, NAD  Dermatological: Ulcerations present plantar aspect the left foot.  The more medial wound measures 2.5 x 1.8 x 0.1 cm with a granular wound base.  The more lateral wound is 2.2 x 1.8 x 0.1 cm with a fibrotic wound base.  There is macerated tissue between the wounds as well as on the periphery.  There is no significant cellulitis present.  There is no active drainage or pus.  There is no fluctuance or crepitation.  There is no malodor.  No probing to bone, undermining or tunneling.  On the right foot there is 1 small area of what appears to be dried blood but there is no open lesion.  Hyperkeratotic tissue submetatarsal 1 without ulceration drainage or signs of infection.     Vascular: Dorsalis Pedis artery and Posterior Tibial artery pedal pulses  are 2/4 bilateral with immedate capillary fill time.  There is no pain with calf compression, swelling, warmth, erythema.   Neruologic: Sensation decreased  Musculoskeletal: Muscular strength 5/5 in all groups tested bilateral.  Gait: Unassisted, Nonantalgic.       Assessment:   Ulceration left foot     Plan:  -Treatment options discussed including all alternatives, risks, and complications -Etiology of symptoms were discussed -X-rays were obtained and reviewed with the patient.  No subacute fracture or osteomyelitis noted.  Previous injury noted on the left foot. -I did sharply debride the wounds today to help debrided healthy, bleeding tissue utilizing #312 blade scalpel.  Minimal blood loss and tolerated well.  Still fibrotic wound base on the more lateral wound on the left foot.  Elected prescribe Santyl and discussed application instructions.  Prescribe doxycycline once the Levaquin is complete.  Surgical shoe for the left foot. -Discussed need to stay off the foot is much as possible keep the foot elevated. -Monitor for any clinical signs or symptoms of infection and directed to call the office immediately should any occur or go to the ER.  Return in about 2 weeks (around 06/29/2021) for left foot ulcer.  Trula Slade DPM

## 2021-06-15 NOTE — Patient Instructions (Signed)
Collagenase ointment What is this medication? COLLAGENASE (kohl LAH jen ace) is an enzyme that breaks down collagen in damaged tissue and helps healthy tissue to grow. It may help wounds heal faster. This medicine may be used for other purposes; ask your health care provider or pharmacist if you have questions. COMMON BRAND NAME(S): Santyl What should I tell my care team before I take this medication? They need to know if you have any of these conditions: an unusual or allergic reaction to collagenase, other medicines, foods, dyes, or preservatives pregnant or trying to get pregnant breast-feeding How should I use this medication? This medicine is for external use only. Do not take by mouth. Wash the wound as directed by your health care professional. Do not scrub. If you are applying a topical antibiotic to the wound, apply the antibiotic before this medicine. Do not touch the tip of the ointment tube with any surface, especially your fingers or the wound. Try to only get the ointment on the wound itself. If some gets on normal skin, wipe away with a sterile gauze pad. Do not use your medicine more often than directed. Talk to your pediatrician regarding the use of this medicine in children. Special care may be needed. Overdosage: If you think you have taken too much of this medicine contact a poison control center or emergency room at once. NOTE: This medicine is only for you. Do not share this medicine with others. What if I miss a dose? If you miss a dose, use it as soon as you can. If it is almost time for your next dose, use only that dose. Do not use double or extra doses. What may interact with this medication? Do not take this medicine with any of the following medications: aluminum acetate, Burow's solution povidone iodine silver nitrate silver sulfadiazine This list may not describe all possible interactions. Give your health care provider a list of all the medicines, herbs,  non-prescription drugs, or dietary supplements you use. Also tell them if you smoke, drink alcohol, or use illegal drugs. Some items may interact with your medicine. What should I watch for while using this medication? Visit your doctor or health care professional for regular checks on your progress. If your wound begins to look worse, smell bad, has colored discharge or more discharge, or increases in size, contact your health care professional. You may have an infection or need a change in your treatment. If you develop a fever, chills, low blood pressure, dizziness, rapid heartbeat, or confusion, contact your health care professional immediately. You may have an infection of your blood. What side effects may I notice from receiving this medication? Side effects that you should report to your doctor or health care professional as soon as possible: breathing problems skin rash or hives Side effects that usually do not require medical attention (report to your doctor or health care professional if they continue or are bothersome): redness at the application site This list may not describe all possible side effects. Call your doctor for medical advice about side effects. You may report side effects to FDA at 1-800-FDA-1088. Where should I keep my medication? Keep out of the reach of children. Store below 25 degrees C (77 degrees F). Throw away any unused medication after the expiration date. NOTE: This sheet is a summary. It may not cover all possible information. If you have questions about this medicine, talk to your doctor, pharmacist, or health care provider.  2022 Elsevier/Gold Standard (2007-12-19 16:39:42)

## 2021-06-16 ENCOUNTER — Other Ambulatory Visit: Payer: Self-pay | Admitting: Podiatry

## 2021-06-16 MED ORDER — SILVER SULFADIAZINE 1 % EX CREA: 1.0000 "application " | TOPICAL_CREAM | Freq: Every day | CUTANEOUS | 0 refills | Status: DC

## 2021-06-17 NOTE — Telephone Encounter (Signed)
Returned call to patient and informed.

## 2021-07-03 ENCOUNTER — Other Ambulatory Visit: Payer: Self-pay

## 2021-07-03 ENCOUNTER — Ambulatory Visit (INDEPENDENT_AMBULATORY_CARE_PROVIDER_SITE_OTHER): Payer: Medicare Other | Admitting: Podiatry

## 2021-07-03 VITALS — Temp 98.3°F

## 2021-07-03 DIAGNOSIS — E1149 Type 2 diabetes mellitus with other diabetic neurological complication: Secondary | ICD-10-CM | POA: Diagnosis not present

## 2021-07-03 DIAGNOSIS — L97522 Non-pressure chronic ulcer of other part of left foot with fat layer exposed: Secondary | ICD-10-CM | POA: Diagnosis not present

## 2021-07-06 NOTE — Progress Notes (Signed)
Subjective: 50 year old male presents the office today for follow evaluation of a wound on the bottom of his left foot.  He feels that the wound is getting better.  Denies any drainage or pus.  He has been keeping Silvadene on the wound as we could not get Santyl covered by insurance.  States he is aware that she has steroids.  Continue to work.  Denies any fevers or chills.  No other concerns.  Objective: AAO x3, NAD DP/PT pulses palpable bilaterally, CRT less than 3 seconds Sensation decreased with Semmes Weinstein monofilament Ulcerations: Aspect left foot.  More medial wound measures 1.5 x 0.8 x 0.1 cm.  Lateral wound measures 1.8 x 1 x 0.1 cm.  Just distal to the lateral wound is a new ulceration measuring 0.4 x 0.4 x 0.1 cm.  There is no surrounding erythema, ascending cellulitis.  No fluctuation crepitation.  No malodor.  No probing, undermining or tunneling. No pain with calf compression, swelling, warmth, erythema  Assessment: 50 year old male with ulcerations left foot  Plan: -All treatment options discussed with the patient including all alternatives, risks, complications.  -Unfortunately patient has to continue be on his feet and he states he has to stay active.  This is going to limit his healing which we discussed he is aware of.  I did sharply debride the wound to healthy, granular tissue without complications.  This was completed a #3: Scalpel.  Minimal blocks and hemostasis achieved for manual compression.  Tolerated well.  We will continue Silvadene for now.  We will order silver alginate dressing. -Patient encouraged to call the office with any questions, concerns, change in symptoms.   Trula Slade DPM

## 2021-07-21 ENCOUNTER — Ambulatory Visit (INDEPENDENT_AMBULATORY_CARE_PROVIDER_SITE_OTHER): Payer: Medicare Other | Admitting: Podiatry

## 2021-07-21 ENCOUNTER — Other Ambulatory Visit: Payer: Self-pay

## 2021-07-21 DIAGNOSIS — E1149 Type 2 diabetes mellitus with other diabetic neurological complication: Secondary | ICD-10-CM

## 2021-07-21 DIAGNOSIS — L97522 Non-pressure chronic ulcer of other part of left foot with fat layer exposed: Secondary | ICD-10-CM | POA: Diagnosis not present

## 2021-07-23 NOTE — Progress Notes (Signed)
Subjective: 50 year old male presents the office today for follow evaluation of a wound on the bottom of his left foot.  States that one of the wound is healed and the other wound has been doing better.  He has not had any falls.  No recent swelling or redness.  No drainage or pus.  He has no fevers or chills.  No other concerns.   Objective: AAO x3, NAD DP/PT pulses palpable bilaterally, CRT less than 3 seconds Sensation decreased with Semmes Weinstein monofilament Ulcerations present plantar aspect left foot.  The ulceration that was present along the distal lateral has healed.  The wound on the lateral aspect today measures 1.4 x by 0.8 x 0.1 cm.  The ulceration on medial aspect measures 1 x 0.3 cm with a depth of 0.1.  There is no fluctuation crepitation there is no malodor.  No edema, erythema or signs of infection.    Assessment: 50 year old male with ulcerations left foot  Plan: -All treatment options discussed with the patient including all alternatives, risks, complications.  -Sharply debride the wound to healthy, granular tissue without complications.  This was completed a #312 with scalpel.  Minimal blood loss was noted and hemostasis achieved for manual compression.  Tolerated well.  We will continue Silvadene for now.  Continue to dressing changes, offloading daily. -Patient encouraged to call the office with any questions, concerns, change in symptoms.   Trula Slade DPM

## 2021-07-30 ENCOUNTER — Ambulatory Visit (INDEPENDENT_AMBULATORY_CARE_PROVIDER_SITE_OTHER): Payer: Medicare Other

## 2021-07-30 ENCOUNTER — Other Ambulatory Visit: Payer: Self-pay

## 2021-07-30 ENCOUNTER — Ambulatory Visit (INDEPENDENT_AMBULATORY_CARE_PROVIDER_SITE_OTHER): Payer: Medicare Other | Admitting: Podiatry

## 2021-07-30 DIAGNOSIS — E1149 Type 2 diabetes mellitus with other diabetic neurological complication: Secondary | ICD-10-CM | POA: Diagnosis not present

## 2021-07-30 DIAGNOSIS — L97511 Non-pressure chronic ulcer of other part of right foot limited to breakdown of skin: Secondary | ICD-10-CM

## 2021-07-30 DIAGNOSIS — S90221A Contusion of right lesser toe(s) with damage to nail, initial encounter: Secondary | ICD-10-CM

## 2021-07-30 DIAGNOSIS — L97522 Non-pressure chronic ulcer of other part of left foot with fat layer exposed: Secondary | ICD-10-CM

## 2021-07-30 MED ORDER — SILVER SULFADIAZINE 1 % EX CREA: 1.0000 "application " | TOPICAL_CREAM | Freq: Every day | CUTANEOUS | 0 refills | Status: DC

## 2021-07-30 MED ORDER — DOXYCYCLINE HYCLATE 100 MG PO TABS: 100.0000 mg | ORAL_TABLET | Freq: Two times a day (BID) | ORAL | 0 refills | Status: DC

## 2021-08-02 NOTE — Progress Notes (Signed)
Subjective: 50 year old male presents the office today for concerns of a new wound on his right fifth toe.  He states he just noticed this today when he took his shoe off and he found a tube of medicine in his shoe that was rubbing his fifth toe.  Denies any drainage or pus.  On the wound on the left foot appears to be doing better and has been applying Silvadene.  Denies any purulence.  No increase in swelling or redness to both feet.  No fevers or chills.  No other concerns.  Objective: AAO x3, NAD DP/PT pulses palpable bilaterally, CRT less than 3 seconds Sensation decreased with Semmes Weinstein monofilament On the right fifth toe appears to be hyperkeratotic tissue which likely from an old blister that has dried up is what it seems to be.  Small amount of scab, necrotic tissue along the fifth MPJ laterally.  Upon debridement superficial wound in the distal portion of the fifth digit. Ulcerations present plantar aspect left foot.  The ulceration that was present along the distal lateral has healed.  Wounds are still noted along the plantar central aspect of the foot.  Hyperkeratotic periwound.  Granular wound base without any probing, undermining or tunneling.  No surrounding erythema, ascending cellulitis.  No fluctuation crepitation.  No malodor.  See pictures below.  No edema, erythema or signs of infection.          Assessment: 50 year old male with ulcerations left foot, new wound right foot  Plan: -All treatment options discussed with the patient including all alternatives, risks, complications.  -On the right foot x-rays obtained and reviewed.  No subacute fracture, osteomyelitis noted. Hervey Ard debrided hyperkeratotic tissue on the right fifth toe without any complications.  Small superficial wound is present.  Recommend Silvadene dressing changes daily.  Continue offloading at all times. -Sharply debride the wound to healthy, granular tissue without complications.  This was  completed a #312 with scalpel.  Minimal blood loss was noted and hemostasis achieved for manual compression.  Tolerated well.  We will continue Silvadene for now.  Continue to dressing changes, offloading daily. -Doxycycline -Patient encouraged to call the office with any questions, concerns, change in symptoms.   Trula Slade DPM

## 2021-08-05 ENCOUNTER — Telehealth: Payer: Self-pay | Admitting: *Deleted

## 2021-08-11 ENCOUNTER — Encounter: Payer: Self-pay | Admitting: Podiatry

## 2021-08-11 ENCOUNTER — Other Ambulatory Visit: Payer: Self-pay

## 2021-08-11 ENCOUNTER — Ambulatory Visit (INDEPENDENT_AMBULATORY_CARE_PROVIDER_SITE_OTHER): Payer: Medicare Other | Admitting: Podiatry

## 2021-08-11 VITALS — Temp 97.7°F

## 2021-08-11 DIAGNOSIS — Z860100 Personal history of colon polyps, unspecified: Secondary | ICD-10-CM | POA: Insufficient documentation

## 2021-08-11 DIAGNOSIS — H35039 Hypertensive retinopathy, unspecified eye: Secondary | ICD-10-CM | POA: Insufficient documentation

## 2021-08-11 DIAGNOSIS — E1149 Type 2 diabetes mellitus with other diabetic neurological complication: Secondary | ICD-10-CM | POA: Diagnosis not present

## 2021-08-11 DIAGNOSIS — F341 Dysthymic disorder: Secondary | ICD-10-CM | POA: Insufficient documentation

## 2021-08-11 DIAGNOSIS — L97511 Non-pressure chronic ulcer of other part of right foot limited to breakdown of skin: Secondary | ICD-10-CM | POA: Diagnosis not present

## 2021-08-11 DIAGNOSIS — L97522 Non-pressure chronic ulcer of other part of left foot with fat layer exposed: Secondary | ICD-10-CM | POA: Diagnosis not present

## 2021-08-11 DIAGNOSIS — E1165 Type 2 diabetes mellitus with hyperglycemia: Secondary | ICD-10-CM | POA: Insufficient documentation

## 2021-08-11 NOTE — Telephone Encounter (Signed)
Error message

## 2021-08-17 NOTE — Progress Notes (Signed)
Subjective: 50 year old male presents the office today for follow-up evaluation of a wound on his right foot, fifth toe as well as his left plantar foot.  He states they are all doing better.  Gets an occasional bloody drainage but no pus. No increased swelling or redness to his foot.  No fevers or chills and reports no new concerns otherwise.  Objective: AAO x3, NAD DP/PT pulses palpable bilaterally, CRT less than 3 seconds Sensation decreased with Semmes Weinstein monofilament On the right fifth toe appears to be hyperkeratotic tissue and upon debridement appears that the wound are healing. Skin breakdown today.  There is no drainage or pus.  No fluctuation or crepitation.  There is no malodor. Plantar aspect left foot hyperkeratotic lesions on the area the previous wounds x2.  Upon debridement to the wounds of healed.  1 wound is still present with a granular base appears to be smaller.  See picture below.  After debridement the wound measured 1 x 0.8 x 0.2 cm.  Prior to debridement it was measuring 0.8 x 0.5 cm with a depth of 0.1.  There is no probing, undermining or tunneling.  There is no surrounding erythema, ascending cellulitis.  No fluctuation or crepitation.  There is no malodor.        Assessment: 50 year old male with ulcerations left foot, healing wounds.  Plan: -All treatment options discussed with the patient including all alternatives, risks, complications.  -Debrided some of the callus tissue on the right fifth toe with any complications or bleeding.  Patient wound and tendons are much better.  Also the left foot.  The wounds of healed and agreed the hyperkeratotic tissue.  I debrided the wound today utilizing a #312 with scalpel without any complications and healthy, granular, bleeding tissue to help promote wound healing to remove any nonviable devitalized tissue.  Minimal blood loss.  Hemostasis achieved for manual compression.  Tolerated procedure well. -Patient  encouraged to call the office with any questions, concerns, change in symptoms.   Trula Slade DPM

## 2021-08-28 ENCOUNTER — Ambulatory Visit: Payer: Medicare Other | Admitting: Podiatry

## 2021-08-31 ENCOUNTER — Ambulatory Visit (INDEPENDENT_AMBULATORY_CARE_PROVIDER_SITE_OTHER): Payer: Medicare Other | Admitting: Podiatry

## 2021-08-31 ENCOUNTER — Encounter: Payer: Self-pay | Admitting: Podiatry

## 2021-08-31 ENCOUNTER — Other Ambulatory Visit: Payer: Self-pay

## 2021-08-31 DIAGNOSIS — E1149 Type 2 diabetes mellitus with other diabetic neurological complication: Secondary | ICD-10-CM

## 2021-08-31 DIAGNOSIS — L84 Corns and callosities: Secondary | ICD-10-CM | POA: Diagnosis not present

## 2021-08-31 DIAGNOSIS — L97522 Non-pressure chronic ulcer of other part of left foot with fat layer exposed: Secondary | ICD-10-CM | POA: Diagnosis not present

## 2021-09-02 ENCOUNTER — Other Ambulatory Visit: Payer: Self-pay

## 2021-09-02 ENCOUNTER — Ambulatory Visit: Payer: Medicare Other

## 2021-09-02 DIAGNOSIS — L97522 Non-pressure chronic ulcer of other part of left foot with fat layer exposed: Secondary | ICD-10-CM

## 2021-09-02 DIAGNOSIS — E1149 Type 2 diabetes mellitus with other diabetic neurological complication: Secondary | ICD-10-CM

## 2021-09-02 NOTE — Progress Notes (Signed)
Subjective: 50 year old male presents the office today for follow-up evaluation of a wound on his right foot, fifth toe as well as his left plantar foot.  He states that he thinks the wounds are doing much better.  He has noticed some new callus formation on his right foot but otherwise no new ulcerations that he reports.  No significant drainage or pus.  No increase in swelling or redness.  Denies any fevers or chills.  Has no other concerns today.   Objective: AAO x3, NAD DP/PT pulses palpable bilaterally, CRT less than 3 seconds Sensation decreased with Semmes Weinstein monofilament On the right fifth toe appears to be hyperkeratotic tissue and after debridement there is no underlying ulceration today.  Appears the wound on the fifth toe is resolved.  Also the right foot submetatarsal 1 and 5 hyperkeratotic lesions which are preulcerative there is no skin breakdown.  On the left foot while ulceration is still present but appears to be smaller measuring 0.5 x 0.4 cm with a depth of 0.1 cm.  There is granular wound base without any surrounding erythema, edema.  No drainage or pus.  No ascending cellulitis.  No fluctuation crepitation.  No malodor.  Other wounds appear to be healed there is callus formation that was present but after debridement there is no ulceration.  Assessment: 50 year old male with ulcerations left foot, healing wounds.  Plan: -All treatment options discussed with the patient including all alternatives, risks, complications.  -Debrided the callus tissue on the right fifth toe with any complications or bleeding.  Continue offloading.  Recommended moisturizer new callus formation submetatarsal 1 and 5 and offloading. -On the left foot I sharply debrided utilizing a #312 with scalpel the ulceration is present.  I debrided nonviable devitalized tissue to help promote wound healing.  Debrided the wound to healthy, granular wound base.  Minimal blood loss and hemostasis achieved  through manual compression.  He tolerated well.  Continue with small amount of antibiotic ointment dressing changes and offloading. -Monitor for any clinical signs or symptoms of infection and directed to call the office immediately should any occur or go to the ER.  *Diabetic shoes measurement next appointment with offloading of the preulcerative calluses.  Trula Slade DPM

## 2021-09-02 NOTE — Progress Notes (Signed)
SITUATION Reason for Consult: Evaluation for Prefabricated Diabetic Shoes and Bilateral Custom Diabetic Inserts. Patient / Caregiver Report: Patient runs daily and has developed blisters  OBJECTIVE DATA: Patient History / Diagnosis: Diabetes Mellitus with complications  Presence of Diabetic Complications: - Peripheral Neuropathy  Current or Previous Devices: None and no history  In-Person Foot Examination:  Skin presentation:   Thin, Shiny, Hairless Nail presentation:   Thick, Ingrown, With Fungus Ulcers & Callousing:   Callousing on 5th styloid left, blisters on arch  Toe / Foot Deformities:   - Pes Cavus - Hindfoot Varus - Forefoot ADduction  - Hammertoes   Sensation:    Intact  Shoe Size: 14W  ORTHOTIC RECOMMENDATION Recommended Devices: - 1x pair prefabricated PDAC approved diabetic shoes: X801M 14W - 3x pair custom-to-patient vacuum formed diabetic insoles.   GOALS OF SHOES AND INSOLES - Reduce shear and pressure - Reduce / Prevent callus formation - Reduce / Prevent ulceration - Protect the fragile healing compromised diabetic foot.  Patient would benefit from diabetic shoes and inserts as patient has diabetes mellitus and the patient has one or more of the following conditions: - History of previous foot ulceration. - History of pre-ulcerative callus - Peripheral neuropathy with evidence of callus formation - Foot deformity - Poor circulation  ACTIONS PERFORMED Patient was casted for insoles via crush box and measured for shoes via brannock device. Procedure was explained and patient tolerated procedure well. All questions were answered and concerns addressed.  PLAN Insurance to be verified and out of pocket cost communicated to patient. Once cost verified and agreed upon and diabetic certification received, casts are to be sent to [facility] for fabrication. Patient is to be called for fitting when devices are ready.

## 2021-09-15 ENCOUNTER — Other Ambulatory Visit: Payer: Self-pay

## 2021-09-15 ENCOUNTER — Ambulatory Visit (INDEPENDENT_AMBULATORY_CARE_PROVIDER_SITE_OTHER): Payer: Medicare Other | Admitting: Podiatry

## 2021-09-15 DIAGNOSIS — E1149 Type 2 diabetes mellitus with other diabetic neurological complication: Secondary | ICD-10-CM

## 2021-09-15 DIAGNOSIS — L97522 Non-pressure chronic ulcer of other part of left foot with fat layer exposed: Secondary | ICD-10-CM

## 2021-09-15 DIAGNOSIS — L84 Corns and callosities: Secondary | ICD-10-CM | POA: Diagnosis not present

## 2021-09-16 NOTE — Progress Notes (Signed)
Subjective: 50 year old male presents the office today for follow-up evaluation of a wound on his right foot, fifth toe as well as his left plantar foot.  He states the wounds are doing much better.  The right foot is completely healed.  He has 1 small area in the left foot.  Denies any drainage or pus or any swelling or redness.  He has not been keeping a bandage on as it.  Denies any fevers or chills.  Has no other concerns.  Objective: AAO x3, NAD DP/PT pulses palpable bilaterally, CRT less than 3 seconds Sensation decreased with Semmes Weinstein monofilament On the left foot today hyperkeratotic lesion present plantar lateral aspect.  Upon debridement only small superficial pinpoint opening is still evident.  There is no probing, undermining or tunneling.  There is no drainage or pus.  No fluctuation or crepitation but there is no malodor.  Assessment: 50 year old male with ulcerations left foot, healing wounds.  Plan: -All treatment options discussed with the patient including all alternatives, risks, complications.  -Debrided the callus formation left foot to reveal 1 small superficial opening still present.  This was completed with a 312 with scalpel to any complications.  No blood loss.  Continue antibiotic with dressing changes daily and continue offloading.  Continue to monitor for any signs or symptoms of infection. -Diabetic shoe measurement next appointment.  Return in about 6 weeks (around 10/27/2021) for left foot ulcer.  Trula Slade DPM

## 2021-09-23 IMAGING — CT CT RENAL STONE PROTOCOL
2 of 4 series · 16 of 46 positions shown, 18 images · non-contrast
Comparison: None.

CLINICAL DATA: Right flank pain for the last several days

EXAM:
CT ABDOMEN AND PELVIS WITHOUT CONTRAST
TECHNIQUE: Multidetector CT imaging of the abdomen and pelvis was performed
following the standard protocol without IV contrast.

[Series 3: ap without · axial · non-contrast · 0.78mm/px · z∈[-250,+185]mm · 13 of 99 slices shown, 15 images]
[im 6/99  soft-tissue]
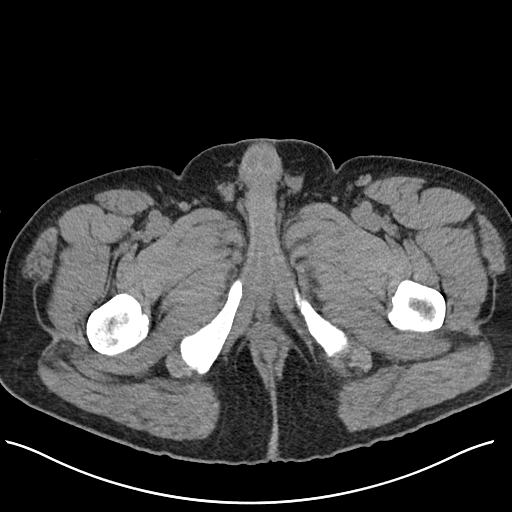
[im 6/99  bone]
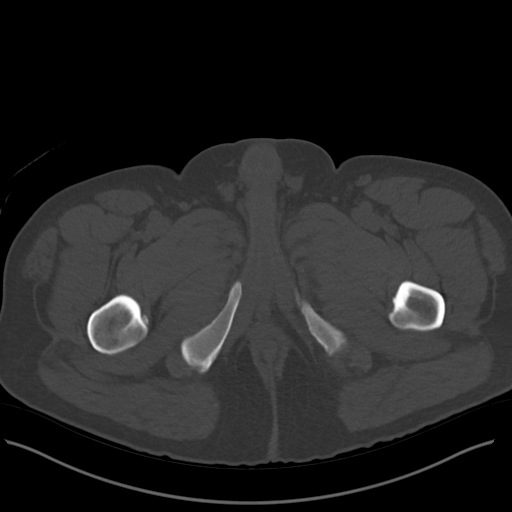
[im 11/99  soft-tissue]
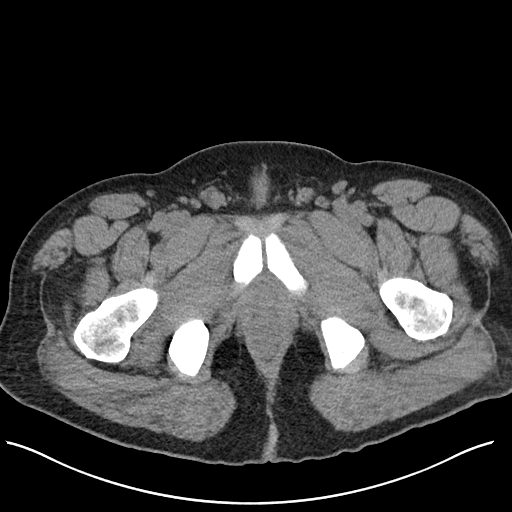
[im 22/99  soft-tissue]
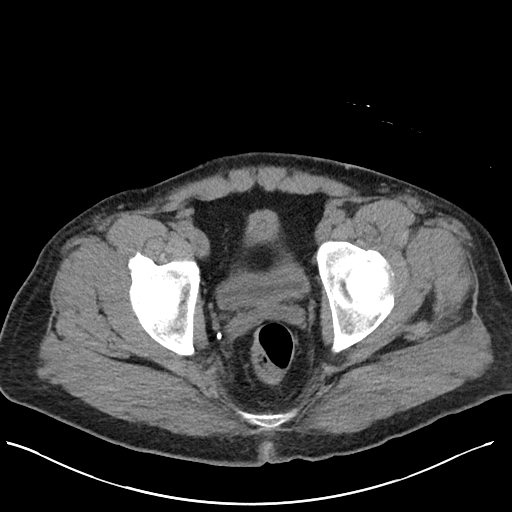
[im 28/99  soft-tissue]
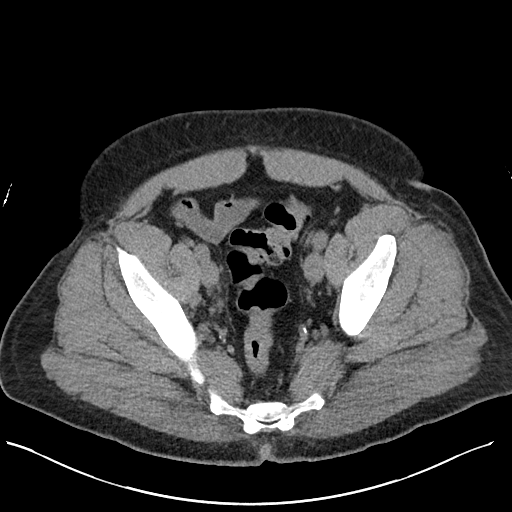
[im 33/99  soft-tissue]
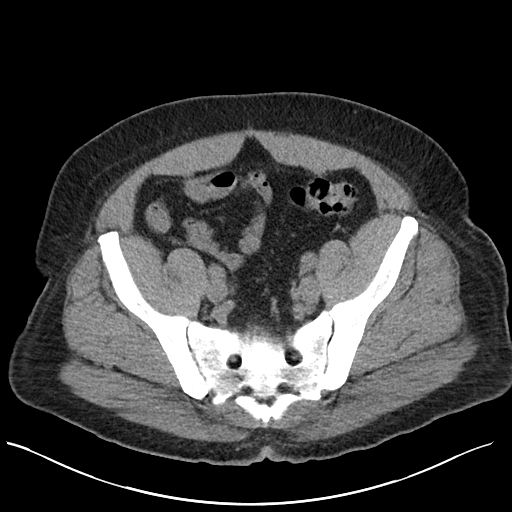
[im 44/99  soft-tissue]
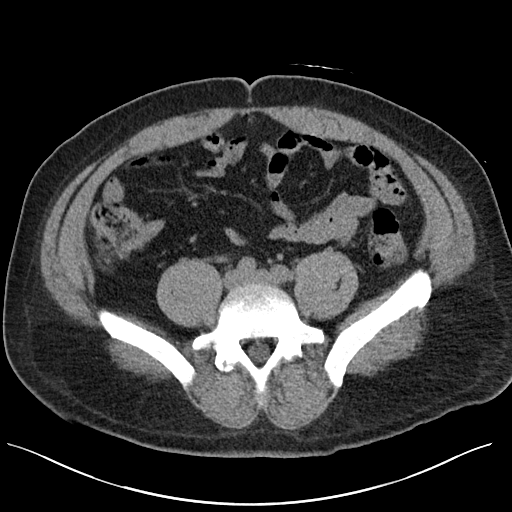
[im 50/99  soft-tissue]
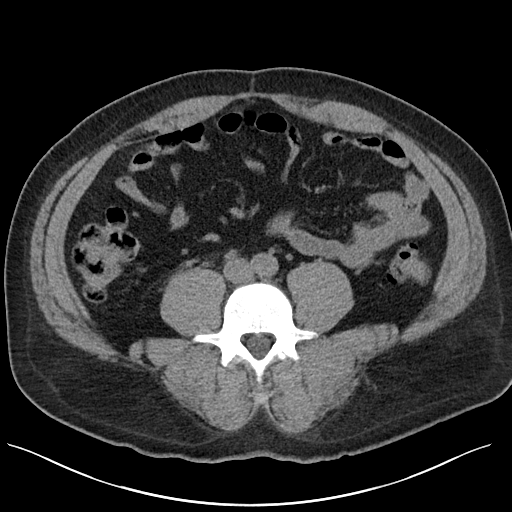
[im 55/99  soft-tissue]
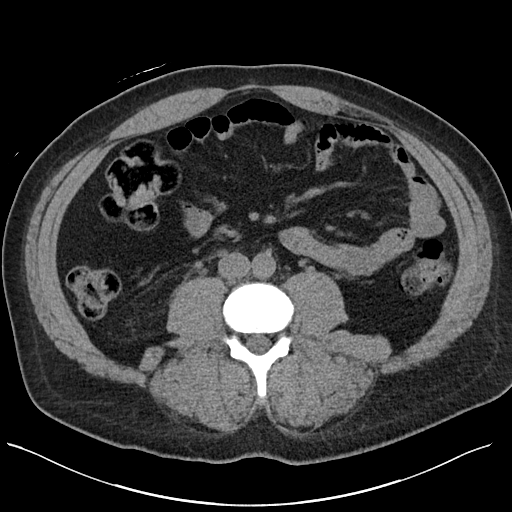
[im 66/99  soft-tissue]
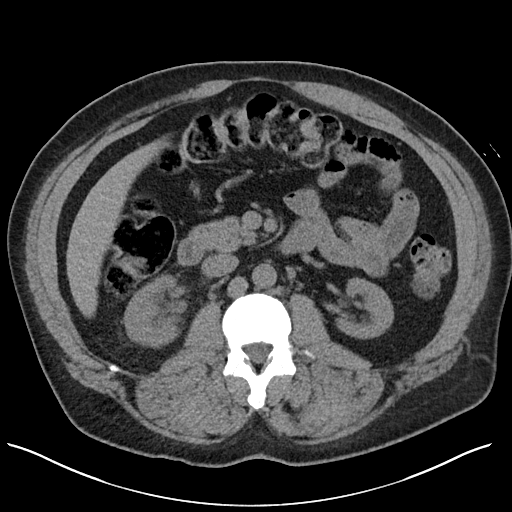
[im 66/99  bone]
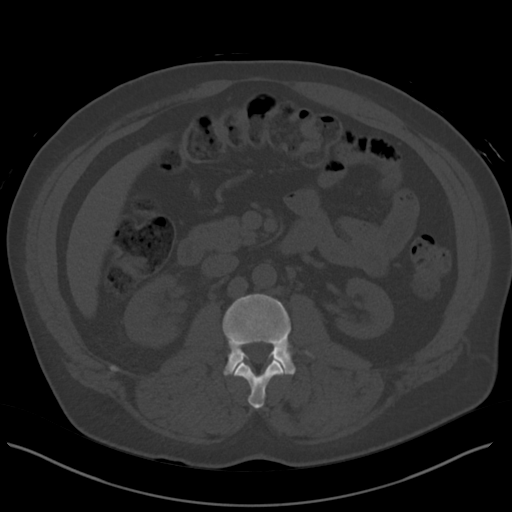
[im 71/99  soft-tissue]
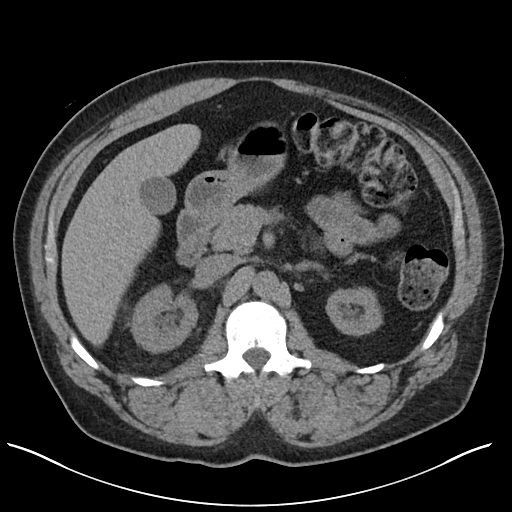
[im 77/99  soft-tissue]
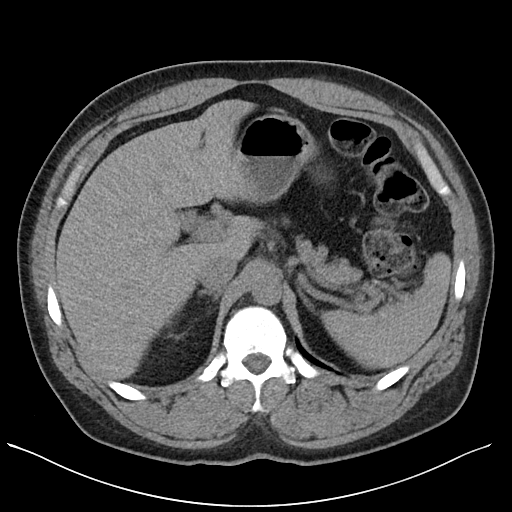
[im 88/99  soft-tissue]
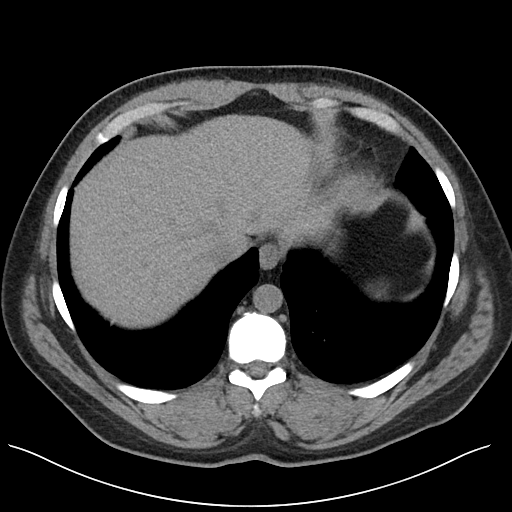
[im 93/99  soft-tissue]
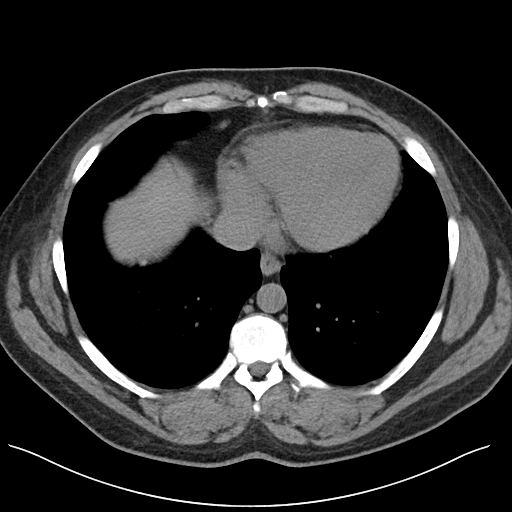

[Series 6: cor · coronal · 0.86mm/px · 3 of 102 slices shown]
[im 34/102  soft-tissue]
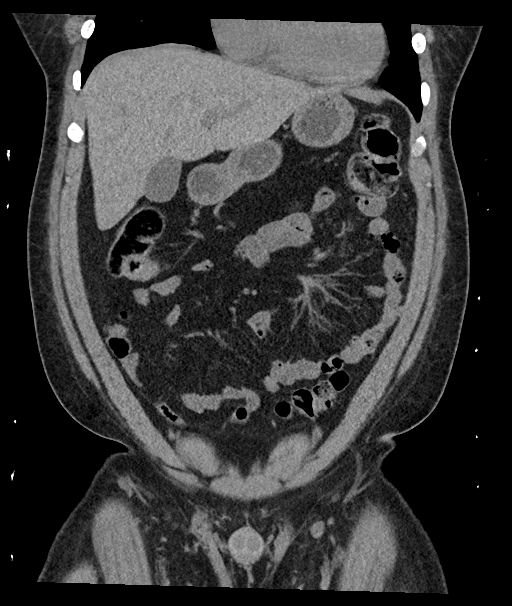
[im 45/102  soft-tissue]
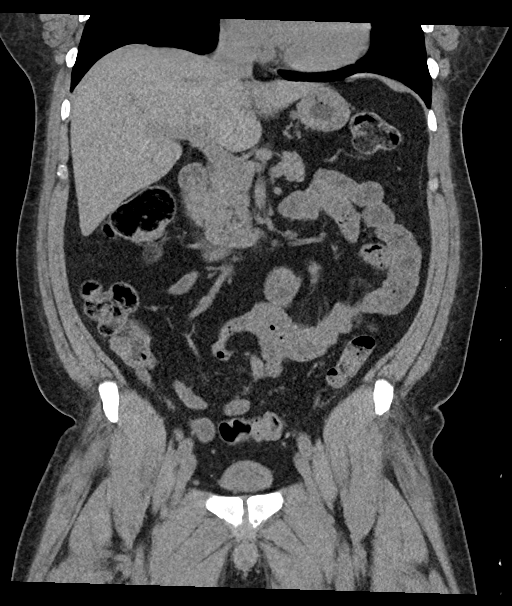
[im 57/102  soft-tissue]
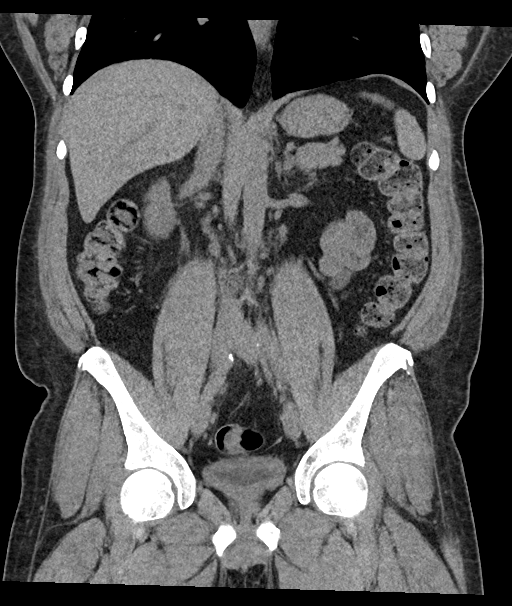

[16 of 46 positions shown; findings below may reference images not displayed]

FINDINGS: Lower chest: No acute abnormality.

Evaluation of the abdominal viscera limited by the lack of IV
contrast.

Hepatobiliary: No focal liver abnormality is seen. Normal appearance
of the gallbladder.

Pancreas: Unremarkable. No surrounding inflammatory changes.

Spleen: Normal in size without focal abnormality.

Adrenals/Urinary Tract: Adrenal glands are unremarkable. There is
mild fullness of the right collecting system with perinephric fat
stranding. No renal calculi identified. No large renal mass
bilaterally. The urinary bladder appears thick walled.

Stomach/Bowel: Stomach is within normal limits. Appendix appears
normal. No evidence of bowel wall thickening, distention, or
inflammatory changes.

Vascular/Lymphatic: No significant vascular findings are present. No
enlarged abdominal or pelvic lymph nodes.

Reproductive: Prostate is unremarkable.

Other: No abdominal wall hernia or abnormality. No abdominopelvic
ascites.

Musculoskeletal: No acute or significant osseous findings.
IMPRESSION: There is mild fullness of the right collecting system with
perinephric fat stranding. No renal calculi identified. The urinary
bladder appears thick walled. Findings raise concern for possible
cystitis with upper tract infection, versus a recently passed stone.

## 2021-09-29 ENCOUNTER — Ambulatory Visit (INDEPENDENT_AMBULATORY_CARE_PROVIDER_SITE_OTHER): Payer: Medicare Other | Admitting: Podiatry

## 2021-09-29 ENCOUNTER — Other Ambulatory Visit: Payer: Self-pay

## 2021-09-29 DIAGNOSIS — L84 Corns and callosities: Secondary | ICD-10-CM | POA: Diagnosis not present

## 2021-09-29 DIAGNOSIS — L97522 Non-pressure chronic ulcer of other part of left foot with fat layer exposed: Secondary | ICD-10-CM

## 2021-09-29 DIAGNOSIS — E1149 Type 2 diabetes mellitus with other diabetic neurological complication: Secondary | ICD-10-CM

## 2021-09-29 NOTE — Progress Notes (Signed)
Subjective: 51 year old male presents the office today for follow-up evaluation of a wound on his right foot, fifth toe as well as his left plantar foot.  States he is doing much better.  Denies any swelling or redness or any drainage coming from the wounds.  He is wearing regular shoes.  He is doing normal activities but is not back to running or increased activity otherwise.  Denies fevers or chills.  No other concerns  Objective: AAO x3, NAD DP/PT pulses palpable bilaterally, CRT less than 3 seconds Sensation decreased with Semmes Weinstein monofilament On the left foot today hyperkeratotic lesion present along the plantar aspect of the foot.  Upon opening there is no underlying ulceration but the area is preulcerative.  There is no surrounding erythema, ascending size.  No drainage or pus or signs of infection noted today.  Other wounds have healed.  Assessment: 51 year old male with ulcerations left foot, healing wounds.  Plan: -All treatment options discussed with the patient including all alternatives, risks, complications.  -Debrided the callus Complications or bleeding.  Recommend moisturizer and offloading daily.  He is still awaiting his new inserts.  Hopefully he can get these next week and then once he is comfortable that he can start to increase activity level.  Monitor for any signs or symptoms of infection or reoccurrence.   Trula Slade DPM

## 2021-10-09 ENCOUNTER — Ambulatory Visit: Payer: Medicare Other

## 2021-10-09 ENCOUNTER — Other Ambulatory Visit: Payer: Self-pay

## 2021-10-09 DIAGNOSIS — E1342 Other specified diabetes mellitus with diabetic polyneuropathy: Secondary | ICD-10-CM

## 2021-10-09 DIAGNOSIS — E1149 Type 2 diabetes mellitus with other diabetic neurological complication: Secondary | ICD-10-CM

## 2021-10-09 DIAGNOSIS — L97522 Non-pressure chronic ulcer of other part of left foot with fat layer exposed: Secondary | ICD-10-CM

## 2021-10-09 DIAGNOSIS — L89899 Pressure ulcer of other site, unspecified stage: Secondary | ICD-10-CM | POA: Diagnosis not present

## 2021-10-09 DIAGNOSIS — E11621 Type 2 diabetes mellitus with foot ulcer: Secondary | ICD-10-CM

## 2021-10-09 NOTE — Progress Notes (Signed)
SITUATION Reason for Visit: Fitting of Diabetic Shoes & Insoles Patient / Caregiver Report:  Patient is satisfied with shoes and insoles  OBJECTIVE DATA: Patient History / Diagnosis:     ICD-10-CM   1. 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 Ulcerated, foot, left, with fat layer exposed ([place])  L97.522       Change in Status:   None  ACTIONS PERFORMED: In-Person Delivery, patient was fit with: - 1x pair A5500 PDAC approved prefabricated Diabetic Shoes: X801M 12M - 3x pair E3343 PDAC approved CAM milled custom diabetic insoles  Shoes and insoles were verified for structural integrity and safety. Patient wore shoes and insoles in office. Skin was inspected and free of areas of concern after wearing shoes and inserts. Shoes and inserts fit properly. Patient / Caregiver provided with ferbal instruction and demonstration regarding donning, doffing, wear, care, proper fit, function, purpose, cleaning, and use of shoes and insoles ' and in all related precautions and risks and benefits regarding shoes and insoles. Patient / Caregiver was instructed to wear properly fitting socks with shoes at all times. Patient was also provided with verbal instruction regarding how to report any failures or malfunctions of shoes or inserts, and necessary follow up care. Patient / Caregiver was also instructed to contact physician regarding change in status that may affect function of shoes and inserts.   Patient / Caregiver verbalized undersatnding of instruction provided. Patient / Caregiver demonstrated independence with proper donning and doffing of shoes and inserts.  PLAN Patient to follow up as needed. Plan of care was discussed with and agreed upon by patient and/or caregiver. All questions were answered and concerns addressed.

## 2021-10-27 ENCOUNTER — Ambulatory Visit (INDEPENDENT_AMBULATORY_CARE_PROVIDER_SITE_OTHER): Payer: Medicare Other | Admitting: Podiatry

## 2021-10-27 ENCOUNTER — Other Ambulatory Visit: Payer: Self-pay

## 2021-10-27 ENCOUNTER — Ambulatory Visit: Payer: Medicare Other

## 2021-10-27 DIAGNOSIS — L97522 Non-pressure chronic ulcer of other part of left foot with fat layer exposed: Secondary | ICD-10-CM

## 2021-10-27 DIAGNOSIS — E1149 Type 2 diabetes mellitus with other diabetic neurological complication: Secondary | ICD-10-CM | POA: Diagnosis not present

## 2021-10-27 DIAGNOSIS — L84 Corns and callosities: Secondary | ICD-10-CM

## 2021-10-27 NOTE — Progress Notes (Signed)
SITUATION Reason for Consult: Follow-up with diabetic shoes and insoles Patient / Caregiver Report: Patient reports slipping in shoes  OBJECTIVE DATA History / Diagnosis:    ICD-10-CM   1. 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 Ulcerated, foot, left, with fat layer exposed ([place])  L97.522       Change in Pathology: Skin breakdown on right hallux  ACTIONS PERFORMED Patient's equipment was checked for structural stability and fit. Added 1/8" spacers under custom orthotics and re-laced shoes with ladder lace technique. Patient reports excessive looseness and slippage is eliminated. Device(s) intact and fit is excellent. All questions answered and concerns addressed.  PLAN Follow-up as needed (PRN). Plan of care discussed with and agreed upon by patient / caregiver.

## 2021-10-29 NOTE — Progress Notes (Signed)
Subjective: 51 year old male presents the office today for follow-up evaluation of a wound on his right foot, fifth toe as well as his left plantar foot.  He states that those ulcerations have healed but he has a new area in the right second toe.  He does not recall any injury does not see any swelling redness or any drainage.  He feels his inserts are not fitting appropriately as the sliding side of his shoes.  Denies any fevers, chills, nausea, vomiting.  Objective: AAO x3, NAD DP/PT pulses palpable bilaterally, CRT less than 3 seconds Sensation decreased with Semmes Weinstein monofilament On the plantar aspect the left foot.  Hyperkeratotic lesion submetatarsal.  Upon debridement there is no ongoing ulceration drainage or signs of infection but the area is preulcerative.  The distal aspect the right second toe there is a preulcerative area with some dried blood but there is no skin breakdown otherwise.  There is no significant callus formation noted to debride.  There is no fluctuation or crepitation.  There is no malodor. No pain with calf compression, erythema or warmth.        Assessment: 51 year old male with ulcerations left foot, new preulcerative area right second toe  Plan: -All treatment options discussed with the patient including all alternatives, risks, complications.  -Sharply debrided the hyperkeratotic lesion left foot without any complications or bleeding.  Recommend moisturizer and offloading daily.  There is a new area in the right second toe.  There is no significant callus formation to debride today.  Recommended offloading.  Monitor for any skin breakdown or any signs or symptoms of infection. -He followed up with our orthotist, Aaron Edelman today for orthotic modifications -Monitor for any clinical signs or symptoms of infection and directed to call the office immediately should any occur or go to the ER.  Return in about 3 weeks (around 11/17/2021) for pre-ulcerative  lesions.  Trula Slade DPM

## 2021-11-17 ENCOUNTER — Other Ambulatory Visit: Payer: Self-pay

## 2021-11-17 ENCOUNTER — Ambulatory Visit (INDEPENDENT_AMBULATORY_CARE_PROVIDER_SITE_OTHER): Payer: Medicare Other | Admitting: Podiatry

## 2021-11-17 DIAGNOSIS — E1149 Type 2 diabetes mellitus with other diabetic neurological complication: Secondary | ICD-10-CM | POA: Diagnosis not present

## 2021-11-17 DIAGNOSIS — L84 Corns and callosities: Secondary | ICD-10-CM

## 2021-11-17 NOTE — Progress Notes (Signed)
Subjective: 51 year old male presents the office today for follow-up evaluation of a wound on his right foot, fifth toe as well as his left plantar foot.  He states he is doing much better and the wound is well-healed.  He has not seen any new open sores.  Denies any drainage or pus.  No fevers or chills.  No other concerns.  Back to wearing his shoes and regular activities without any issues.    Objective: AAO x3, NAD DP/PT pulses palpable bilaterally, CRT less than 3 seconds Sensation decreased with Semmes Weinstein monofilament On the plantar aspect the left foot there is an area of hyperkeratotic tissue from the prior wound.  Upon debridement there is no underlying ulceration drainage or any signs of infection.  There is no ulcerations noted today.  Also on the right foot there is no ulcerations in the area and the fifth toe is completely healed.\ No pain with calf compression, erythema or warmth.  Assessment: 51 year old male with preulcerative lesions  Plan: -All treatment options discussed with the patient including all alternatives, risks, complications.  -Debrided the hyperkeratotic tissue on the left without any complications or bleeding although it was minimal today.  The wounds are well-healed.  Recommend moisturizer and offloading daily. -Discussed glucose control and daily foot inspection. -Monitor for any clinical signs or symptoms of infection and directed to call the office immediately should any occur or go to the ER.  Return in about 3 months (around 02/14/2022).  Trula Slade DPM

## 2021-11-17 NOTE — Patient Instructions (Signed)

## 2022-02-16 ENCOUNTER — Ambulatory Visit (INDEPENDENT_AMBULATORY_CARE_PROVIDER_SITE_OTHER): Payer: Medicare Other | Admitting: Podiatry

## 2022-02-16 DIAGNOSIS — E1149 Type 2 diabetes mellitus with other diabetic neurological complication: Secondary | ICD-10-CM

## 2022-02-16 DIAGNOSIS — L84 Corns and callosities: Secondary | ICD-10-CM

## 2022-02-16 NOTE — Patient Instructions (Signed)
Diabetes Mellitus and Foot Care Foot care is an important part of your health, especially when you have diabetes. Diabetes may cause you to have problems because of poor blood flow (circulation) to your feet and legs, which can cause your skin to: Become thinner and drier. Break more easily. Heal more slowly. Peel and crack. You may also have nerve damage (neuropathy) in your legs and feet, causing decreased feeling in them. This means that you may not notice minor injuries to your feet that could lead to more serious problems. Noticing and addressing any potential problems early is the best way to prevent future foot problems. How to care for your feet Foot hygiene  Wash your feet daily with warm water and mild soap. Do not use hot water. Then, pat your feet and the areas between your toes until they are completely dry. Do not soak your feet as this can dry your skin. Trim your toenails straight across. Do not dig under them or around the cuticle. File the edges of your nails with an emery board or nail file. Apply a moisturizing lotion or petroleum jelly to the skin on your feet and to dry, brittle toenails. Use lotion that does not contain alcohol and is unscented. Do not apply lotion between your toes. Shoes and socks Wear clean socks or stockings every day. Make sure they are not too tight. Do not wear knee-high stockings since they may decrease blood flow to your legs. Wear shoes that fit properly and have enough cushioning. Always look in your shoes before you put them on to be sure there are no objects inside. To break in new shoes, wear them for just a few hours a day. This prevents injuries on your feet. Wounds, scrapes, corns, and calluses  Check your feet daily for blisters, cuts, bruises, sores, and redness. If you cannot see the bottom of your feet, use a mirror or ask someone for help. Do not cut corns or calluses or try to remove them with medicine. If you find a minor scrape,  cut, or break in the skin on your feet, keep it and the skin around it clean and dry. You may clean these areas with mild soap and water. Do not clean the area with peroxide, alcohol, or iodine. If you have a wound, scrape, corn, or callus on your foot, look at it several times a day to make sure it is healing and not infected. Check for: Redness, swelling, or pain. Fluid or blood. Warmth. Pus or a bad smell. General tips Do not cross your legs. This may decrease blood flow to your feet. Do not use heating pads or hot water bottles on your feet. They may burn your skin. If you have lost feeling in your feet or legs, you may not know this is happening until it is too late. Protect your feet from hot and cold by wearing shoes, such as at the beach or on hot pavement. Schedule a complete foot exam at least once a year (annually) or more often if you have foot problems. Report any cuts, sores, or bruises to your health care provider immediately. Where to find more information American Diabetes Association: www.diabetes.org Association of [employer] Education Specialists: www.diabeteseducator.org Contact a health care provider if: You have a medical condition that increases your risk of infection and you have any cuts, sores, or bruises on your feet. You have an injury that is not healing. You have redness on your legs or feet. You   feel burning or tingling in your legs or feet. You have pain or cramps in your legs and feet. Your legs or feet are numb. Your feet always feel cold. You have pain around any toenails. Get help right away if: You have a wound, scrape, corn, or callus on your foot and: You have pain, swelling, or redness that gets worse. You have fluid or blood coming from the wound, scrape, corn, or callus. Your wound, scrape, corn, or callus feels warm to the touch. You have pus or a bad smell coming from the wound, scrape, corn, or callus. You have a fever. You have a red  line going up your leg. Summary Check your feet every day for blisters, cuts, bruises, sores, and redness. Apply a moisturizing lotion or petroleum jelly to the skin on your feet and to dry, brittle toenails. Wear shoes that fit properly and have enough cushioning. If you have foot problems, report any cuts, sores, or bruises to your health care provider immediately. Schedule a complete foot exam at least once a year (annually) or more often if you have foot problems. This information is not intended to replace advice given to you by your health care provider. Make sure you discuss any questions you have with your health care provider. Document Revised: 03/27/2020 Document Reviewed: 03/27/2020 Elsevier Patient Education  2023 [employer]  

## 2022-02-22 NOTE — Progress Notes (Signed)
Subjective: 51 year old male presents the office with concerns of a callus which is new on his left foot.  Present he states his feet are doing fine.  He states the inserts have not been very helpful but he does not have them with him today.  Denies any open sores or any swelling or redness or drainage.  Objective: AAO x3, NAD DP/PT pulses palpable bilaterally, CRT less than 3 seconds Sensation decreased and he has a history of neuropathy. Minimal hyperkeratotic tissue present along the left arch area there is new, healthy skin present but the area is preulcerative.  No edema, erythema or signs of infection.  Hyperkeratotic lesion submetatarsal bilaterally without any underlying ulceration drainage or signs of infection. No pain with calf compression, swelling, warmth, erythema  Assessment: Preulcerative calluses  Plan: -All treatment options discussed with the patient including all alternatives, risks, complications.  -Debrided hyperkeratotic lesions without any complications or bleeding.  Recommend moisturizer and offloading.  Also encourage daily foot inspection should they have any opening, drainage or signs of infection of the medial immediately. -Patient encouraged to call the office with any questions, concerns, change in symptoms.   Trula Slade DPM

## 2022-04-28 ENCOUNTER — Encounter (INDEPENDENT_AMBULATORY_CARE_PROVIDER_SITE_OTHER): Payer: Self-pay

## 2022-09-27 ENCOUNTER — Other Ambulatory Visit: Payer: Self-pay | Admitting: Orthopedic Surgery

## 2022-10-21 ENCOUNTER — Emergency Department (HOSPITAL_COMMUNITY): Payer: Medicare Other

## 2022-10-21 ENCOUNTER — Other Ambulatory Visit: Payer: Self-pay

## 2022-10-21 ENCOUNTER — Inpatient Hospital Stay (HOSPITAL_COMMUNITY)
Admission: EM | Admit: 2022-10-21 | Discharge: 2022-10-23 | DRG: 444 | Discharge status: Against Medical Advice | Payer: Medicare Other | Attending: Internal Medicine | Admitting: Internal Medicine

## 2022-10-21 ENCOUNTER — Encounter (HOSPITAL_COMMUNITY): Payer: Self-pay | Admitting: Emergency Medicine

## 2022-10-21 DIAGNOSIS — K8020 Calculus of gallbladder without cholecystitis without obstruction: Secondary | ICD-10-CM | POA: Diagnosis present

## 2022-10-21 DIAGNOSIS — Z5329 Procedure and treatment not carried out because of patient's decision for other reasons: Secondary | ICD-10-CM | POA: Diagnosis present

## 2022-10-21 DIAGNOSIS — Z6839 Body mass index (BMI) 39.0-39.9, adult: Secondary | ICD-10-CM

## 2022-10-21 DIAGNOSIS — N186 End stage renal disease: Secondary | ICD-10-CM | POA: Diagnosis present

## 2022-10-21 DIAGNOSIS — Z833 Family history of diabetes mellitus: Secondary | ICD-10-CM | POA: Diagnosis not present

## 2022-10-21 DIAGNOSIS — I120 Hypertensive chronic kidney disease with stage 5 chronic kidney disease or end stage renal disease: Secondary | ICD-10-CM | POA: Diagnosis present

## 2022-10-21 DIAGNOSIS — Z79899 Other long term (current) drug therapy: Secondary | ICD-10-CM | POA: Diagnosis not present

## 2022-10-21 DIAGNOSIS — Z992 Dependence on renal dialysis: Secondary | ICD-10-CM

## 2022-10-21 DIAGNOSIS — Z7901 Long term (current) use of anticoagulants: Secondary | ICD-10-CM | POA: Diagnosis not present

## 2022-10-21 DIAGNOSIS — E1122 Type 2 diabetes mellitus with diabetic chronic kidney disease: Secondary | ICD-10-CM | POA: Diagnosis present

## 2022-10-21 DIAGNOSIS — K6389 Other specified diseases of intestine: Secondary | ICD-10-CM | POA: Diagnosis present

## 2022-10-21 DIAGNOSIS — Z8249 Family history of ischemic heart disease and other diseases of the circulatory system: Secondary | ICD-10-CM | POA: Diagnosis not present

## 2022-10-21 DIAGNOSIS — Z7982 Long term (current) use of aspirin: Secondary | ICD-10-CM | POA: Diagnosis not present

## 2022-10-21 DIAGNOSIS — R7989 Other specified abnormal findings of blood chemistry: Secondary | ICD-10-CM

## 2022-10-21 DIAGNOSIS — N2581 Secondary hyperparathyroidism of renal origin: Secondary | ICD-10-CM | POA: Diagnosis present

## 2022-10-21 DIAGNOSIS — I10 Essential (primary) hypertension: Secondary | ICD-10-CM | POA: Insufficient documentation

## 2022-10-21 DIAGNOSIS — R748 Abnormal levels of other serum enzymes: Secondary | ICD-10-CM | POA: Diagnosis present

## 2022-10-21 DIAGNOSIS — E669 Obesity, unspecified: Secondary | ICD-10-CM | POA: Diagnosis present

## 2022-10-21 DIAGNOSIS — E785 Hyperlipidemia, unspecified: Secondary | ICD-10-CM | POA: Diagnosis present

## 2022-10-21 DIAGNOSIS — R112 Nausea with vomiting, unspecified: Secondary | ICD-10-CM

## 2022-10-21 DIAGNOSIS — Z800 Family history of malignant neoplasm of digestive organs: Secondary | ICD-10-CM | POA: Diagnosis not present

## 2022-10-21 DIAGNOSIS — D649 Anemia, unspecified: Secondary | ICD-10-CM | POA: Diagnosis present

## 2022-10-21 DIAGNOSIS — K8070 Calculus of gallbladder and bile duct without cholecystitis without obstruction: Secondary | ICD-10-CM | POA: Diagnosis present

## 2022-10-21 LAB — CBC WITH DIFFERENTIAL/PLATELET
Abs Immature Granulocytes: 0.02 10*3/uL (ref 0.00–0.07)
Basophils Absolute: 0 10*3/uL (ref 0.0–0.1)
Basophils Relative: 0 %
Eosinophils Absolute: 0 10*3/uL (ref 0.0–0.5)
Eosinophils Relative: 0 %
HCT: 31.4 % — ABNORMAL LOW (ref 39.0–52.0)
Hemoglobin: 10.8 g/dL — ABNORMAL LOW (ref 13.0–17.0)
Immature Granulocytes: 0 %
Lymphocytes Relative: 15 %
Lymphs Abs: 0.8 10*3/uL (ref 0.7–4.0)
MCH: 28 pg (ref 26.0–34.0)
MCHC: 34.4 g/dL (ref 30.0–36.0)
MCV: 81.3 fL (ref 80.0–100.0)
Monocytes Absolute: 0.6 10*3/uL (ref 0.1–1.0)
Monocytes Relative: 11 %
Neutro Abs: 3.8 10*3/uL (ref 1.7–7.7)
Neutrophils Relative %: 74 %
Platelets: 183 10*3/uL (ref 150–400)
RBC: 3.86 MIL/uL — ABNORMAL LOW (ref 4.22–5.81)
RDW: 17.2 % — ABNORMAL HIGH (ref 11.5–15.5)
WBC: 5.2 10*3/uL (ref 4.0–10.5)
nRBC: 0 % (ref 0.0–0.2)

## 2022-10-21 LAB — COMPREHENSIVE METABOLIC PANEL
ALT: 143 U/L — ABNORMAL HIGH (ref 0–44)
AST: 152 U/L — ABNORMAL HIGH (ref 15–41)
Albumin: 3.8 g/dL (ref 3.5–5.0)
Alkaline Phosphatase: 59 U/L (ref 38–126)
Anion gap: 16 — ABNORMAL HIGH (ref 5–15)
BUN: 60 mg/dL — ABNORMAL HIGH (ref 6–20)
CO2: 25 mmol/L (ref 22–32)
Calcium: 9.6 mg/dL (ref 8.9–10.3)
Chloride: 94 mmol/L — ABNORMAL LOW (ref 98–111)
Creatinine, Ser: 10.58 mg/dL — ABNORMAL HIGH (ref 0.61–1.24)
GFR, Estimated: 5 mL/min — ABNORMAL LOW (ref >60)
Glucose, Bld: 158 mg/dL — ABNORMAL HIGH (ref 70–99)
Potassium: 4.4 mmol/L (ref 3.5–5.1)
Sodium: 135 mmol/L (ref 135–145)
Total Bilirubin: 2.5 mg/dL — ABNORMAL HIGH (ref 0.3–1.2)
Total Protein: 7.7 g/dL (ref 6.5–8.1)

## 2022-10-21 LAB — LIPASE, BLOOD: Lipase: 82 U/L — ABNORMAL HIGH (ref 11–51)

## 2022-10-21 MED ORDER — ONDANSETRON HCL 4 MG/2ML IJ SOLN
4.0000 mg | Freq: Once | INTRAMUSCULAR | Status: AC
  Administered 2022-10-21: 4 mg via INTRAVENOUS
  Filled 2022-10-21: qty 2

## 2022-10-21 MED ORDER — MORPHINE SULFATE (PF) 4 MG/ML IV SOLN
4.0000 mg | Freq: Once | INTRAVENOUS | Status: AC
  Administered 2022-10-21: 4 mg via INTRAVENOUS
  Filled 2022-10-21: qty 1

## 2022-10-21 NOTE — ED Provider Notes (Signed)
[facility] Provider Note   [name]: [id number] Arrival date & time: 10/21/22  1949     History  Chief Complaint  Patient presents with   Abdominal Pain    Ray Bradley is a 52 y.o. male.  Patient presents the emergency department complaining of right upper quadrant abdominal pain which began suddenly this morning.  He also endorses nausea and vomiting.  Patient states he has a history of nephrolithiasis but states that this feels different.  He denies shortness of breath, chest pain, urinary symptoms.  He does endorse missing dialysis today due to the pain.  Past medical history significant for end-stage renal disease on hemodialysis, type 2 diabetes, hypertension, anemia and chronic kidney disease, long-term use of anticoagulants  HPI     Home Medications Prior to Admission medications   Medication Sig Start Date End Date Taking? Authorizing Provider  amLODipine (NORVASC) 10 MG tablet 1 tablet    [provider]  aspirin EC 81 MG tablet Take 81 mg by mouth daily.     [provider]  calcitRIOL (ROCALTROL) 0.5 MCG capsule 1 capsule    [provider]  carvedilol (COREG) 12.5 MG tablet Take 12.5 mg by mouth 2 (two) times daily with a meal.    [provider]  carvedilol (COREG) 25 MG tablet Take 25 mg by mouth 2 (two) times daily. 08/20/18   [provider]  collagenase (SANTYL) ointment Apply 1 application topically daily. 06/15/21   Trula Slade, DPM  ferric citrate (AURYXIA) 1 GM 210 MG(Fe) tablet Take 210 mg by mouth 3 (three) times daily with meals.    [provider]  ferric citrate (AURYXIA) 1 GM 210 MG(Fe) tablet 2 tablets with meals    [provider]  heparin sodium, porcine, 1000 UNIT/ML injection Inject 2,000 Units into the vein See admin instructions. Patient states he receive 4 times a week per patient, no specific days. 02/04/20   [provider]   hydrALAZINE (APRESOLINE) 100 MG tablet 1 tablet with food    [provider]  iron sucrose (VENOFER) 20 MG/ML injection See admin instructions.    [provider]  losartan (COZAAR) 50 MG tablet Take 50 mg by mouth daily. 10/02/19   [provider]  Methoxy PEG-Epoetin Beta (MIRCERA IJ) Inject into the skin. 07/27/21   [provider]  mupirocin ointment (BACTROBAN) 2 % Apply topically. 05/25/21   [provider]  nystatin ointment (MYCOSTATIN) Apply topically. 06/10/21   [provider]  oxyCODONE-acetaminophen (PERCOCET) 5-325 MG tablet Take 1 tablet by mouth every 6 (six) hours as needed. Patient not taking: Reported on 05/25/2021 08/24/19   Gabriel Earing, PA-C  rosuvastatin (CRESTOR) 5 MG tablet Take 5 mg by mouth daily.  08/20/18   [provider]  silver sulfADIAZINE (SILVADENE) 1 % cream Apply 1 application topically daily. 07/30/21   Trula Slade, DPM      Allergies    Patient has no known allergies.    Review of Systems   Review of Systems  Gastrointestinal:  Positive for abdominal pain, nausea and vomiting.    Physical Exam Updated Vital Signs BP (!) 151/66 (BP Location: Right Arm)   Pulse 78   Temp 97.7 F (36.5 C) (Oral)   Resp 20   Ht 5\' 10"  (1.778 m)   Wt 125 kg   SpO2 98%   BMI 39.54 kg/m  Physical Exam Vitals and nursing note reviewed.  Constitutional:      General: He is not in acute distress.    Appearance: He is well-developed.  HENT:     Head: Normocephalic and atraumatic.  Eyes:     Conjunctiva/sclera: Conjunctivae normal.  Cardiovascular:     Rate and Rhythm: Normal rate and regular rhythm.     Heart sounds: No murmur heard. Pulmonary:     Effort: Pulmonary effort is normal. No respiratory distress.     Breath sounds: Normal breath sounds.  Abdominal:     Palpations: Abdomen is soft.     Tenderness: There is abdominal tenderness in the right upper quadrant.  Musculoskeletal:         General: No swelling.     Cervical back: Neck supple.  Skin:    General: Skin is warm and dry.     Capillary Refill: Capillary refill takes less than 2 seconds.  Neurological:     Mental Status: He is alert.  Psychiatric:        Mood and Affect: Mood normal.     ED Results / Procedures / Treatments   Labs (all labs ordered are listed, but only abnormal results are displayed) Labs Reviewed  CBC WITH DIFFERENTIAL/PLATELET - Abnormal; Notable for the following components:      Result Value   RBC 3.86 (*)    Hemoglobin 10.8 (*)    HCT 31.4 (*)    RDW 17.2 (*)    All other components within normal limits  COMPREHENSIVE METABOLIC PANEL - Abnormal; Notable for the following components:   [place] 94 (*)    Glucose, Bld 158 (*)    BUN 60 (*)    Creatinine, Ser 10.58 (*)    AST 152 (*)    ALT 143 (*)    Total Bilirubin 2.5 (*)    GFR, Estimated 5 (*)    Anion gap 16 (*)    All other components within normal limits  LIPASE, BLOOD - Abnormal; Notable for the following components:   Lipase 82 (*)    All other components within normal limits  URINALYSIS, ROUTINE W REFLEX MICROSCOPIC    EKG None  Radiology CT ABDOMEN PELVIS WO CONTRAST  Result Date: 10/21/2022 CLINICAL DATA:  Right upper quadrant pain radiating to his back since this morning with nausea and vomiting EXAM: CT ABDOMEN AND PELVIS WITHOUT CONTRAST TECHNIQUE: Multidetector CT imaging of the abdomen and pelvis was performed following the standard protocol without IV contrast. RADIATION DOSE REDUCTION: This exam was performed according to the departmental dose-optimization program which includes automated exposure control, adjustment of the mA and/or kV according to patient size and/or use of iterative reconstruction technique. COMPARISON:  CT 05/25/2021 FINDINGS: Lower chest: No acute abnormality. Hepatobiliary: No suspicious focal liver abnormality is seen. No gallstones, gallbladder wall thickening, or biliary  dilatation. Pancreas: Unremarkable. No pancreatic ductal dilatation or surrounding inflammatory changes. Spleen: Normal in size without focal abnormality. Adrenals/Urinary Tract: Adrenal glands are unremarkable. Kidneys are normal, without renal calculi, suspicious focal lesion, or hydronephrosis. Bladder is unremarkable. Stomach/Bowel: Stomach is within normal limits. No evidence of bowel wall thickening, distention, or inflammatory changes. The appendix is normal. Cecal pneumatosis. No evidence of associated inflammation, ischemia, or obstruction. Vascular/Lymphatic: No significant vascular findings are present. No enlarged abdominal or pelvic lymph nodes. Reproductive: Unremarkable. Other: No free intraperitoneal fluid or air. Musculoskeletal: No acute or significant osseous findings. IMPRESSION: Cecal pneumatosis. Favored benign and incidental as there is no evidence of associated inflammation, ischemia or obstruction. Otherwise unremarkable noncontrast  CT abdomen and pelvis. Electronically Signed   By: Placido Sou M.D.   On: 10/21/2022 22:21   US Abdomen Limited RUQ (LIVER/GB)  Result Date: 10/21/2022 CLINICAL DATA:  Right upper quadrant pain EXAM: ULTRASOUND ABDOMEN LIMITED RIGHT UPPER QUADRANT COMPARISON:  05/25/2021 FINDINGS: Gallbladder: Gallbladder is well distended with multiple gallstones within. No wall thickening or pericholecystic fluid is noted. Negative sonographic Murphy's sign is elicited. Common bile duct: Diameter: 4 mm. Liver: No focal lesion identified. Within normal limits in parenchymal echogenicity. Portal vein is patent on color Doppler imaging with normal direction of blood flow towards the liver. Other: None. IMPRESSION: Cholelithiasis without complicating factors. Electronically Signed   By: Inez Catalina M.D.   On: 10/21/2022 20:49    Procedures Procedures    Medications Ordered in ED Medications  morphine (PF) 4 MG/ML injection 4 mg (4 mg Intravenous Given 10/21/22 2030)   ondansetron (ZOFRAN) injection 4 mg (4 mg Intravenous Given 10/21/22 2030)    ED Course/ Medical Decision Making/ A&P                             Medical Decision Making Amount and/or Complexity of Data Reviewed Labs: ordered. Radiology: ordered.  Risk Prescription drug management.   This patient presents to the ED for concern of right upper quadrant abdominal pain with nausea and vomiting, this involves an extensive number of treatment options, and is a complaint that carries with it a high risk of complications and morbidity.  The differential diagnosis includes cholecystitis, cholelithiasis, pancreatitis, others   Co morbidities that complicate the patient evaluation  ESRD on dialysis, hypertension, previous nephrolithiasis   Additional history obtained:  Additional history obtained from family at bedside External records from outside source obtained and reviewed including liver enzymes from July 2013 with AST and ALT in the 20 range   Lab Tests:  I Ordered, and personally interpreted labs.  The pertinent results include: AST 152, ALT 143, T. bili 2.5, hemoglobin 10.8, lipase 82   Imaging Studies ordered:  I ordered imaging studies including right upper quadrant ultrasound, CT abdomen pelvis without contrast I independently visualized and interpreted imaging which showed cholelithiasis on ultrasound.  Cecal pneumatosis on CT I agree with the radiologist interpretation    Consultations Obtained:  I requested consultation with the general surgeon, Dr.Thompson  and discussed lab and imaging findings as well as pertinent plan - they recommend: plan to see patient overnight/AM for consult. Concern over elevated LFTs, mildly elevated bilirubin  Requested consultation with the hospitalist. Dr.Mansy agreed to admit the patient due to elevated LFT's for further evaluation and management   Problem List / ED Course / Critical interventions / Medication management   I  ordered medication including morphine for pain, Zofran for nausea Reevaluation of the patient after these medicines showed that the patient improved I have reviewed the patients home medicines and have made adjustments as needed    Test / Admission - Considered:  Patient needs admission this time for continued pain management and for further evaluation surgery.  Concerns over possible blockage of the CBD due to elevated liver enzymes and elevated bilirubin        Final Clinical Impression(s) / ED Diagnoses Final diagnoses:  None    Rx / DC Orders ED Discharge Orders     None         Ronny Bacon 10/21/22 2329    Cristie Hem, MD 10/22/22  1532  

## 2022-10-21 NOTE — ED Triage Notes (Addendum)
Patient BIB [facility] from home RUQ abd pain that radiates to his back that started this AM.  Patient also reports n/v.  Patient does have a history of kidney stones but reports this feels somewhat different.  Patient is a dialysis patient, 4 days a week.  Patient did miss dialysis today because he wasn't feeling well.  172/p 96% RA 76 HR CBG 169

## 2022-10-22 ENCOUNTER — Encounter (HOSPITAL_COMMUNITY): Payer: Self-pay | Admitting: Family Medicine

## 2022-10-22 ENCOUNTER — Inpatient Hospital Stay (HOSPITAL_COMMUNITY): Payer: Medicare Other

## 2022-10-22 DIAGNOSIS — R7989 Other specified abnormal findings of blood chemistry: Secondary | ICD-10-CM

## 2022-10-22 DIAGNOSIS — Z992 Dependence on renal dialysis: Secondary | ICD-10-CM

## 2022-10-22 DIAGNOSIS — E785 Hyperlipidemia, unspecified: Secondary | ICD-10-CM | POA: Insufficient documentation

## 2022-10-22 DIAGNOSIS — I10 Essential (primary) hypertension: Secondary | ICD-10-CM | POA: Insufficient documentation

## 2022-10-22 DIAGNOSIS — E1122 Type 2 diabetes mellitus with diabetic chronic kidney disease: Secondary | ICD-10-CM | POA: Insufficient documentation

## 2022-10-22 DIAGNOSIS — K8020 Calculus of gallbladder without cholecystitis without obstruction: Secondary | ICD-10-CM | POA: Diagnosis not present

## 2022-10-22 LAB — COMPREHENSIVE METABOLIC PANEL
ALT: 245 U/L — ABNORMAL HIGH (ref 0–44)
AST: 217 U/L — ABNORMAL HIGH (ref 15–41)
Albumin: 3.9 g/dL (ref 3.5–5.0)
Alkaline Phosphatase: 94 U/L (ref 38–126)
Anion gap: 16 — ABNORMAL HIGH (ref 5–15)
BUN: 68 mg/dL — ABNORMAL HIGH (ref 6–20)
CO2: 27 mmol/L (ref 22–32)
Calcium: 9.4 mg/dL (ref 8.9–10.3)
Chloride: 94 mmol/L — ABNORMAL LOW (ref 98–111)
Creatinine, Ser: 11.72 mg/dL — ABNORMAL HIGH (ref 0.61–1.24)
GFR, Estimated: 5 mL/min — ABNORMAL LOW (ref >60)
Glucose, Bld: 139 mg/dL — ABNORMAL HIGH (ref 70–99)
Potassium: 3.9 mmol/L (ref 3.5–5.1)
Sodium: 137 mmol/L (ref 135–145)
Total Bilirubin: 4.7 mg/dL — ABNORMAL HIGH (ref 0.3–1.2)
Total Protein: 8 g/dL (ref 6.5–8.1)

## 2022-10-22 LAB — GLUCOSE, CAPILLARY
Glucose-Capillary: 128 mg/dL — ABNORMAL HIGH (ref 70–99)
Glucose-Capillary: 123 mg/dL — ABNORMAL HIGH (ref 70–99)
Glucose-Capillary: 110 mg/dL — ABNORMAL HIGH (ref 70–99)
Glucose-Capillary: 185 mg/dL — ABNORMAL HIGH (ref 70–99)

## 2022-10-22 LAB — CBC
HCT: 32.3 % — ABNORMAL LOW (ref 39.0–52.0)
Hemoglobin: 11.2 g/dL — ABNORMAL LOW (ref 13.0–17.0)
MCH: 28.1 pg (ref 26.0–34.0)
MCHC: 34.7 g/dL (ref 30.0–36.0)
MCV: 81.2 fL (ref 80.0–100.0)
Platelets: 181 10*3/uL (ref 150–400)
RBC: 3.98 MIL/uL — ABNORMAL LOW (ref 4.22–5.81)
RDW: 17 % — ABNORMAL HIGH (ref 11.5–15.5)
WBC: 4.4 10*3/uL (ref 4.0–10.5)
nRBC: 0 % (ref 0.0–0.2)

## 2022-10-22 LAB — HIV ANTIBODY (ROUTINE TESTING W REFLEX): HIV Screen 4th Generation wRfx: NONREACTIVE

## 2022-10-22 LAB — HEPATITIS PANEL, ACUTE
HCV Ab: NONREACTIVE
Hep A IgM: NONREACTIVE
Hep B C IgM: NONREACTIVE
Hepatitis B Surface Ag: NONREACTIVE

## 2022-10-22 LAB — HEMOGLOBIN A1C
Hgb A1c MFr Bld: 7 % — ABNORMAL HIGH (ref 4.8–5.6)
Mean Plasma Glucose: 154.2 mg/dL

## 2022-10-22 MED ORDER — CALCITRIOL 0.5 MCG PO CAPS
0.5000 ug | ORAL_CAPSULE | Freq: Every day | ORAL | Status: DC
  Filled 2022-10-22 (×2): qty 1

## 2022-10-22 MED ORDER — HEPARIN SODIUM (PORCINE) 1000 UNIT/ML IJ SOLN
INTRAMUSCULAR | Status: AC
  Filled 2022-10-22: qty 4

## 2022-10-22 MED ORDER — ENOXAPARIN SODIUM 40 MG/0.4ML IJ SOSY: 40.0000 mg | PREFILLED_SYRINGE | INTRAMUSCULAR | Status: DC

## 2022-10-22 MED ORDER — ROSUVASTATIN CALCIUM 5 MG PO TABS: 5.0000 mg | ORAL_TABLET | Freq: Every day | ORAL | Status: DC

## 2022-10-22 MED ORDER — ACETAMINOPHEN 650 MG RE SUPP: 650.0000 mg | Freq: Four times a day (QID) | RECTAL | Status: DC | PRN

## 2022-10-22 MED ORDER — TRAZODONE HCL 50 MG PO TABS: 25.0000 mg | ORAL_TABLET | Freq: Every evening | ORAL | Status: DC | PRN

## 2022-10-22 MED ORDER — MAGNESIUM HYDROXIDE 400 MG/5ML PO SUSP: 30.0000 mL | Freq: Every day | ORAL | Status: DC | PRN

## 2022-10-22 MED ORDER — INSULIN ASPART 100 UNIT/ML IJ SOLN: 0.0000 [IU] | Freq: Three times a day (TID) | INTRAMUSCULAR | Status: DC

## 2022-10-22 MED ORDER — CARVEDILOL 12.5 MG PO TABS: 12.5000 mg | ORAL_TABLET | Freq: Every day | ORAL | Status: DC | PRN

## 2022-10-22 MED ORDER — LOSARTAN POTASSIUM 50 MG PO TABS: 50.0000 mg | ORAL_TABLET | Freq: Every day | ORAL | Status: DC

## 2022-10-22 MED ORDER — INSULIN ASPART 100 UNIT/ML IJ SOLN: 0.0000 [IU] | Freq: Every day | INTRAMUSCULAR | Status: DC

## 2022-10-22 MED ORDER — ACETAMINOPHEN 325 MG PO TABS: 650.0000 mg | ORAL_TABLET | Freq: Four times a day (QID) | ORAL | Status: DC | PRN

## 2022-10-22 MED ORDER — AMLODIPINE BESYLATE 10 MG PO TABS: 10.0000 mg | ORAL_TABLET | Freq: Every day | ORAL | Status: DC

## 2022-10-22 MED ORDER — FERRIC CITRATE 1 GM 210 MG(FE) PO TABS
210.0000 mg | ORAL_TABLET | Freq: Three times a day (TID) | ORAL | Status: DC
  Filled 2022-10-22 (×5): qty 1

## 2022-10-22 MED ORDER — MORPHINE SULFATE (PF) 2 MG/ML IV SOLN: 2.0000 mg | INTRAVENOUS | Status: DC | PRN

## 2022-10-22 MED ORDER — LABETALOL HCL 5 MG/ML IV SOLN: 20.0000 mg | INTRAVENOUS | Status: DC | PRN

## 2022-10-22 MED ORDER — GADOBUTROL 1 MMOL/ML IV SOLN
10.0000 mL | Freq: Once | INTRAVENOUS | Status: AC | PRN
  Administered 2022-10-22: 10 mL via INTRAVENOUS

## 2022-10-22 NOTE — Consult Note (Signed)
[facility] Renal Consultation Note  Indication for Consultation:  Management of ESRD/hemodialysis; anemia, hypertension/volume and secondary hyperparathyroidism  HPI: Ray Bradley is a 52 y.o. 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  Now admitted with symptomatic cholelithiasis= recurrent nausea vomiting after a meal.  Denies  fever or chills, right upper quadrant abdominal pain now resolved mild LFT elevation mild lipase elevation and General surgery consulted.  This a.m. has no complaints denies nausea vomiting denies any abdominal pain, states he will do hemodialysis only if he is allowed to cannulate himself today.  No excess volume on exam and labs are stable      Past Medical History:  Diagnosis Date   Chest pain    Chronic kidney disease    HTN (hypertension)    Hyperlipidemia    Type II or unspecified type diabetes mellitus without mention of complication, not stated as uncontrolled    Wears glasses     Past Surgical History:  Procedure Laterality Date   AV FISTULA PLACEMENT Left 08/24/2019   Procedure: CREATION OF ARTERIOVENOUS (AV) FISTULA  LEFT ARM;  Surgeon: [name], MD;  Location: [facility] OR;  Service: Vascular;  Laterality: Left;   CIRCUMCISION, NON-NEWBORN  2004   COLONOSCOPY W/ BIOPSIES AND POLYPECTOMY     EYE SURGERY     FOOT SURGERY     IR FLUORO GUIDE CV LINE RIGHT  07/23/2019   IR FLUORO GUIDE CV LINE RIGHT  09/19/2019   IR US GUIDE VASC ACCESS RIGHT  07/23/2019   MULTIPLE TOOTH EXTRACTIONS        Family History  Problem Relation Age of Onset   Colon cancer Father    Diabetes Other        family history   Hypertension Other        family history   Aneurysm Mother        died age 39, brain   Hypertension Mother    Hypertension Sister 41   Hypertension Sister 48   Hypertension Brother 53      reports that he has never smoked. He has never used smokeless  tobacco. He reports that he does not currently use alcohol. He reports that he does not use drugs.  No Known Allergies  Prior to Admission medications   Medication Sig Start Date End Date Taking? Authorizing Provider  amLODipine (NORVASC) 10 MG tablet Take 10 mg by mouth daily.   Yes [provider]  aspirin EC 81 MG tablet Take 81 mg by mouth daily.    Yes [provider]  calcitRIOL (ROCALTROL) 0.5 MCG capsule Take 0.5 mcg by mouth daily.   Yes [provider]  carvedilol (COREG) 12.5 MG tablet Take 12.5 mg by mouth daily as needed (for systolic greater than 657).   Yes [provider]  ferric citrate (AURYXIA) 1 GM 210 MG(Fe) tablet Take 210 mg by mouth 3 (three) times daily with meals.   Yes [provider]  heparin sodium, porcine, 1000 UNIT/ML injection Inject 2,000 Units into the vein See admin instructions. Patient states he receive 4 times a week per patient, no specific days. 02/04/20  Yes [provider]  iron sucrose (VENOFER) 20 MG/ML injection See admin instructions.   Yes [provider]  losartan (COZAAR) 50 MG tablet Take 50 mg by mouth daily. 10/02/19  Yes [provider]  Methoxy PEG-Epoetin Beta (MIRCERA IJ) Inject into  the skin. 07/27/21  Yes [provider]  rosuvastatin (CRESTOR) 5 MG tablet Take 5 mg by mouth daily.  08/20/18  Yes [provider]  Semaglutide (OZEMPIC, 0.25 OR 0.5 MG/DOSE, [place]) Inject 0.5 mg into the skin once a week. Monday   Yes [provider]  hydrALAZINE (APRESOLINE) 100 MG tablet 1 tablet with food Patient not taking: Reported on 10/22/2022    [provider]      Results for orders placed or performed during the hospital encounter of 10/21/22 (from the past 48 hour(s))  CBC with Differential     Status: Abnormal   Collection Time: 10/21/22  8:15 PM  Result Value Ref Range   WBC 5.2 4.0 - 10.5 K/uL   RBC 3.86 (L) 4.22 - 5.81 MIL/uL   Hemoglobin  10.8 (L) 13.0 - 17.0 g/dL   HCT 31.4 (L) 39.0 - 52.0 %   MCV 81.3 80.0 - 100.0 fL   MCH 28.0 26.0 - 34.0 pg   MCHC 34.4 30.0 - 36.0 g/dL   RDW 17.2 (H) 11.5 - 15.5 %   Platelets 183 150 - 400 K/uL   nRBC 0.0 0.0 - 0.2 %   Neutrophils Relative % 74 %   Neutro Abs 3.8 1.7 - 7.7 K/uL   Lymphocytes Relative 15 %   Lymphs Abs 0.8 0.7 - 4.0 K/uL   Monocytes Relative 11 %   Monocytes Absolute 0.6 0.1 - 1.0 K/uL   Eosinophils Relative 0 %   Eosinophils Absolute 0.0 0.0 - 0.5 K/uL   Basophils Relative 0 %   Basophils Absolute 0.0 0.0 - 0.1 K/uL   Immature Granulocytes 0 %   Abs Immature Granulocytes 0.02 0.00 - 0.07 K/uL    Comment: Performed at [facility], [address], [place], [place] [address]  Comprehensive metabolic panel     Status: Abnormal   Collection Time: 10/21/22  8:15 PM  Result Value Ref Range   Sodium 135 135 - 145 mmol/L   Potassium 4.4 3.5 - 5.1 mmol/L   [place] 94 (L) 98 - 111 mmol/L   CO2 25 22 - 32 mmol/L   Glucose, Bld 158 (H) 70 - 99 mg/dL    Comment: Glucose reference range applies only to samples taken after fasting for at least 8 hours.   BUN 60 (H) 6 - 20 mg/dL   Creatinine, Ser 10.58 (H) 0.61 - 1.24 mg/dL   Calcium 9.6 8.9 - 10.3 mg/dL   Total Protein 7.7 6.5 - 8.1 g/dL   Albumin 3.8 3.5 - 5.0 g/dL   AST 152 (H) 15 - 41 U/L   ALT 143 (H) 0 - 44 U/L   Alkaline Phosphatase 59 38 - 126 U/L   Total Bilirubin 2.5 (H) 0.3 - 1.2 mg/dL   GFR, Estimated 5 (L) >60 mL/min    Comment: (NOTE) Calculated using the CKD-EPI Creatinine Equation (2021)    Anion gap 16 (H) 5 - 15    Comment: Performed at [facility], [place] [address] [place], [place] [address]  Lipase, blood     Status: Abnormal   Collection Time: 10/21/22  8:15 PM  Result Value Ref Range   Lipase 82 (H) 11 - 51 U/L    Comment: Performed at [facility] [address], [place], [place] [address]  Comprehensive metabolic panel     Status: Abnormal   Collection Time: 10/22/22   8:10 AM  Result Value Ref Range   Sodium  137 135 - 145 mmol/L   Potassium 3.9 3.5 - 5.1 mmol/L   [place] 94 (L) 98 - 111 mmol/L   CO2 27 22 - 32 mmol/L   Glucose, Bld 139 (H) 70 - 99 mg/dL    Comment: Glucose reference range applies only to samples taken after fasting for at least 8 hours.   BUN 68 (H) 6 - 20 mg/dL   Creatinine, Ser 11.72 (H) 0.61 - 1.24 mg/dL   Calcium 9.4 8.9 - 10.3 mg/dL   Total Protein 8.0 6.5 - 8.1 g/dL   Albumin 3.9 3.5 - 5.0 g/dL   AST 217 (H) 15 - 41 U/L   ALT 245 (H) 0 - 44 U/L   Alkaline Phosphatase 94 38 - 126 U/L   Total Bilirubin 4.7 (H) 0.3 - 1.2 mg/dL   GFR, Estimated 5 (L) >60 mL/min    Comment: (NOTE) Calculated using the CKD-EPI Creatinine Equation (2021)    Anion gap 16 (H) 5 - 15    Comment: Performed at [facility], [place] [address] [place], [place] [address]  CBC     Status: Abnormal   Collection Time: 10/22/22  8:10 AM  Result Value Ref Range   WBC 4.4 4.0 - 10.5 K/uL   RBC 3.98 (L) 4.22 - 5.81 MIL/uL   Hemoglobin 11.2 (L) 13.0 - 17.0 g/dL   HCT 32.3 (L) 39.0 - 52.0 %   MCV 81.2 80.0 - 100.0 fL   MCH 28.1 26.0 - 34.0 pg   MCHC 34.7 30.0 - 36.0 g/dL   RDW 17.0 (H) 11.5 - 15.5 %   Platelets 181 150 - 400 K/uL   nRBC 0.0 0.0 - 0.2 %    Comment: Performed at [facility], [place] [address], [place], [place] [address]  HIV Antibody (routine testing w rflx)     Status: None   Collection Time: 10/22/22  8:10 AM  Result Value Ref Range   HIV Screen 4th Generation wRfx Non Reactive Non Reactive    Comment: Performed at [facility], [place] [address], [place], [place] [address]  Hemoglobin A1c     Status: Abnormal   Collection Time: 10/22/22  8:10 AM  Result Value Ref Range   Hgb A1c MFr Bld 7.0 (H) 4.8 - 5.6 %    Comment: (NOTE) Pre diabetes:          5.7%-6.4%  Diabetes:              >6.4%  Glycemic control for   <7.0% adults with diabetes    Mean Plasma Glucose 154.2 mg/dL    Comment: Performed at [facility] [address] [place], [place] [address]  Hepatitis panel, acute     Status: None   Collection Time: 10/22/22  8:10 AM  Result Value Ref Range   Hepatitis B Surface Ag NON REACTIVE NON REACTIVE   HCV Ab NON REACTIVE NON REACTIVE    Comment: (NOTE) Nonreactive HCV antibody screen is consistent with no HCV infections,  unless recent infection is suspected or other evidence exists to indicate HCV infection.     Hep A IgM NON REACTIVE NON REACTIVE   Hep B C IgM NON REACTIVE NON REACTIVE    Comment: Performed at [facility], [place] [address], [place], [place] [address]  Glucose, capillary     Status: Abnormal   Collection Time: 10/22/22  8:28 AM  Result Value Ref Range   Glucose-Capillary 128 (  H) 70 - 99 mg/dL    Comment: Glucose reference range applies only to samples taken after fasting for at least 8 hours.   EKG: .  ROS: As per admit  Physical Exam: Vitals:   10/22/22 0431 10/22/22 0825  BP: (!) 101/46 136/68  Pulse: 69 66  Resp: 16 18  Temp: 98.3 F (36.8 C) (!) 97.5 F (36.4 C)  SpO2: 96% 100%     General: Alert adult male NAD pleasant HEENT: [place], nonicteric, MMM Neck: Supple, no JVD Heart: RRR no MRG Lungs: CTA nonlabored breathing room air Abdomen: NABS, soft NTND Extremities: No pedal edema, Skin: No overt rash or ulcers appreciated Neuro: Alert O x 3 no asterixis no acute focal deficits appreciated Dialysis Access: Left forearm AV fistula positive bruit  Dialysis Orders: Center: Home hemodialysis followed by Dr. [name] Schedule MW, TH, F RI, Sunday 3 hours 30 minutes EDW 117.5 kg 2K bath left forearm AV fistula Heparin 4000 units  No ESA, last Hgb 11.0 P.o. 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

## 2022-10-22 NOTE — ED Notes (Signed)
ED TO INPATIENT HANDOFF REPORT  ED Nurse Name and Phone #: Lanette Hampshire [phone]  S Name/Age/Gender Ray Bradley 52 y.o. male Room/Bed: H022C/H022C  Code Status   Code Status: Full Code  Home/SNF/Other Home Patient oriented to: self, place, time, and situation Is this baseline? Yes   Triage Complete: Triage complete  Chief Complaint Symptomatic cholelithiasis [K80.20]  Triage Note Patient BIB [facility] from home RUQ abd pain that radiates to his back that started this AM.  Patient also reports n/v.  Patient does have a history of kidney stones but reports this feels somewhat different.  Patient is a dialysis patient, 4 days a week.  Patient did miss dialysis today because he wasn't feeling well.  172/p 96% RA 76 HR CBG 169   Allergies No Known Allergies  Level of Care/Admitting Diagnosis ED Disposition     ED Disposition  Admit   Condition  --   Comment  Hospital Area: [facility] [[id number]]  Level of Care: Med-Surg [16]  May admit patient to [facility] or [facility] if equivalent level of care is available:: No  Covid Evaluation: Asymptomatic - no recent exposure (last 10 days) testing not required  Diagnosis: Symptomatic cholelithiasis [177939]  Admitting Physician: Christel Mormon [[id number]]  Attending Physician: Christel Mormon [[id number]]  Certification:: I certify this patient will need inpatient services for at least 2 midnights  Estimated Length of Stay: 2          B Medical/Surgery History Past Medical History:  Diagnosis Date   Chest pain    Chronic kidney disease    HTN (hypertension)    Hyperlipidemia    Type II or unspecified type diabetes mellitus without mention of complication, not stated as uncontrolled    Wears glasses    Past Surgical History:  Procedure Laterality Date   AV FISTULA PLACEMENT Left 08/24/2019   Procedure: CREATION OF ARTERIOVENOUS (AV) FISTULA  LEFT ARM;  Surgeon: [name], MD;  Location: [facility];  Service: Vascular;  Laterality: Left;   CIRCUMCISION, NON-NEWBORN  2004   COLONOSCOPY W/ BIOPSIES AND POLYPECTOMY     EYE SURGERY     FOOT SURGERY     IR FLUORO GUIDE CV LINE RIGHT  07/23/2019   IR FLUORO GUIDE CV LINE RIGHT  09/19/2019   IR US GUIDE VASC ACCESS RIGHT  07/23/2019   MULTIPLE TOOTH EXTRACTIONS       A IV Location/Drains/Wounds Patient Lines/Drains/Airways Status     Active Line/Drains/Airways     Name Placement date Placement time Site Days   Peripheral IV 10/21/22 20 G 1" Anterior;Right Forearm 10/21/22  2029  Forearm  1   Fistula / Graft Left Arteriovenous fistula 08/24/19  1136  --  1155   Hemodialysis Catheter Right Internal jugular Double lumen Permanent (Tunneled) 09/19/19  1454  Internal jugular  1129   Incision (Closed) 07/23/19 Chest 07/23/19  1040  -- 1187   Incision (Closed) 07/23/19 Chest 07/23/19  1040  -- 1187   Incision (Closed) 08/24/19 Arm Left 08/24/19  1139  -- 1155            Intake/Output Last 24 hours No intake or output data in the 24 hours ending 10/22/22 0048  Labs/Imaging Results for orders placed or performed during the hospital encounter of 10/21/22 (from the past 48 hour(s))  CBC with Differential     Status: Abnormal   Collection Time: 10/21/22  8:15 PM  Result Value Ref Range  WBC 5.2 4.0 - 10.5 K/uL   RBC 3.86 (L) 4.22 - 5.81 MIL/uL   Hemoglobin 10.8 (L) 13.0 - 17.0 g/dL   HCT 31.4 (L) 39.0 - 52.0 %   MCV 81.3 80.0 - 100.0 fL   MCH 28.0 26.0 - 34.0 pg   MCHC 34.4 30.0 - 36.0 g/dL   RDW 17.2 (H) 11.5 - 15.5 %   Platelets 183 150 - 400 K/uL   nRBC 0.0 0.0 - 0.2 %   Neutrophils Relative % 74 %   Neutro Abs 3.8 1.7 - 7.7 K/uL   Lymphocytes Relative 15 %   Lymphs Abs 0.8 0.7 - 4.0 K/uL   Monocytes Relative 11 %   Monocytes Absolute 0.6 0.1 - 1.0 K/uL   Eosinophils Relative 0 %   Eosinophils Absolute 0.0 0.0 - 0.5 K/uL   Basophils Relative 0 %   Basophils Absolute 0.0 0.0 - 0.1 K/uL   Immature Granulocytes 0 %    Abs Immature Granulocytes 0.02 0.00 - 0.07 K/uL    Comment: Performed at [facility] [address], [place], [place] [address]  Comprehensive metabolic panel     Status: Abnormal   Collection Time: 10/21/22  8:15 PM  Result Value Ref Range   Sodium 135 135 - 145 mmol/L   Potassium 4.4 3.5 - 5.1 mmol/L   Chloride 94 (L) 98 - 111 mmol/L   CO2 25 22 - 32 mmol/L   Glucose, Bld 158 (H) 70 - 99 mg/dL    Comment: Glucose reference range applies only to samples taken after fasting for at least 8 hours.   BUN 60 (H) 6 - 20 mg/dL   Creatinine, Ser 10.58 (H) 0.61 - 1.24 mg/dL   Calcium 9.6 8.9 - 10.3 mg/dL   Total Protein 7.7 6.5 - 8.1 g/dL   Albumin 3.8 3.5 - 5.0 g/dL   AST 152 (H) 15 - 41 U/L   ALT 143 (H) 0 - 44 U/L   Alkaline Phosphatase 59 38 - 126 U/L   Total Bilirubin 2.5 (H) 0.3 - 1.2 mg/dL   GFR, Estimated 5 (L) >60 mL/min    Comment: (NOTE) Calculated using the CKD-EPI Creatinine Equation (2021)    Anion gap 16 (H) 5 - 15    Comment: Performed at [facility], [place] [address] [place], [place] [address]  Lipase, blood     Status: Abnormal   Collection Time: 10/21/22  8:15 PM  Result Value Ref Range   Lipase 82 (H) 11 - 51 U/L    Comment: Performed at [facility] [address], [place], [place] [address]   CT ABDOMEN PELVIS WO CONTRAST  Result Date: 10/21/2022 CLINICAL DATA:  Right upper quadrant pain radiating to his back since this morning with nausea and vomiting EXAM: CT ABDOMEN AND PELVIS WITHOUT CONTRAST TECHNIQUE: Multidetector CT imaging of the abdomen and pelvis was performed following the standard protocol without IV contrast. RADIATION DOSE REDUCTION: This exam was performed according to the departmental dose-optimization program which includes automated exposure control, adjustment of the mA and/or kV according to patient size and/or use of iterative reconstruction technique. COMPARISON:  CT 05/25/2021 FINDINGS: Lower chest: No acute abnormality.  Hepatobiliary: No suspicious focal liver abnormality is seen. No gallstones, gallbladder wall thickening, or biliary dilatation. Pancreas: Unremarkable. No pancreatic ductal dilatation or surrounding inflammatory changes. Spleen: Normal in size without focal abnormality. Adrenals/Urinary Tract: Adrenal glands are unremarkable. Kidneys are normal, without renal calculi, suspicious focal lesion,  or hydronephrosis. Bladder is unremarkable. Stomach/Bowel: Stomach is within normal limits. No evidence of bowel wall thickening, distention, or inflammatory changes. The appendix is normal. Cecal pneumatosis. No evidence of associated inflammation, ischemia, or obstruction. Vascular/Lymphatic: No significant vascular findings are present. No enlarged abdominal or pelvic lymph nodes. Reproductive: Unremarkable. Other: No free intraperitoneal fluid or air. Musculoskeletal: No acute or significant osseous findings. IMPRESSION: Cecal pneumatosis. Favored benign and incidental as there is no evidence of associated inflammation, ischemia or obstruction. Otherwise unremarkable noncontrast CT abdomen and pelvis. Electronically Signed   By: Placido Sou M.D.   On: 10/21/2022 22:21   US Abdomen Limited RUQ (LIVER/GB)  Result Date: 10/21/2022 CLINICAL DATA:  Right upper quadrant pain EXAM: ULTRASOUND ABDOMEN LIMITED RIGHT UPPER QUADRANT COMPARISON:  05/25/2021 FINDINGS: Gallbladder: Gallbladder is well distended with multiple gallstones within. No wall thickening or pericholecystic fluid is noted. Negative sonographic Murphy's sign is elicited. Common bile duct: Diameter: 4 mm. Liver: No focal lesion identified. Within normal limits in parenchymal echogenicity. Portal vein is patent on color Doppler imaging with normal direction of blood flow towards the liver. Other: None. IMPRESSION: Cholelithiasis without complicating factors. Electronically Signed   By: Inez Catalina M.D.   On: 10/21/2022 20:49    Pending Labs Unresulted  Labs (From admission, onward)     Start     Ordered   10/22/22 0500  Comprehensive metabolic panel  Tomorrow morning,   R        10/22/22 0044   10/22/22 0500  CBC  Tomorrow morning,   R        10/22/22 0044   10/22/22 0044  HIV Antibody (routine testing w rflx)  (HIV Antibody (Routine testing w reflex) panel)  Once,   R        10/22/22 0044   10/21/22 2004  Urinalysis, Routine w reflex microscopic -Urine, Clean Catch  (ED Abdominal Pain)  Once,   URGENT       Question:  Specimen Source  Answer:  Urine, Clean Catch   10/21/22 2003            Vitals/Pain Today's Vitals   10/21/22 1958 10/21/22 2112  BP: (!) 151/66   Pulse: 78   Resp: 20   Temp: 97.7 F (36.5 C)   TempSrc: Oral   SpO2: 98%   Weight: 125 kg   Height: 5\' 10"  (1.778 m)   PainSc: 7  2     Isolation Precautions No active isolations  Medications Medications  amLODipine (NORVASC) tablet 10 mg (has no administration in time range)  carvedilol (COREG) tablet 12.5 mg (has no administration in time range)  losartan (COZAAR) tablet 50 mg (has no administration in time range)  rosuvastatin (CRESTOR) tablet 5 mg (has no administration in time range)  calcitRIOL (ROCALTROL) capsule 0.5 mcg (has no administration in time range)  ferric citrate (AURYXIA) tablet 210 mg (has no administration in time range)  enoxaparin (LOVENOX) injection 40 mg (has no administration in time range)  acetaminophen (TYLENOL) tablet 650 mg (has no administration in time range)    Or  acetaminophen (TYLENOL) suppository 650 mg (has no administration in time range)  morphine (PF) 2 MG/ML injection 2 mg (has no administration in time range)  traZODone (DESYREL) tablet 25 mg (has no administration in time range)  magnesium hydroxide (MILK OF MAGNESIA) suspension 30 mL (has no administration in time range)  morphine (PF) 4 MG/ML injection 4 mg (4 mg Intravenous Given 10/21/22 2030)  ondansetron (ZOFRAN) injection  4 mg (4 mg Intravenous Given  10/21/22 2030)    Mobility walks       R Recommendations: See Admitting Provider Note  Report given to:   Additional Notes: Patient is pleasant, wife at bedside, ambulates independently, NPO @ 0100.

## 2022-10-22 NOTE — H&P (Signed)
[place]   PATIENT NAME: Ray Bradley    MR#:  [id number]  DATE OF BIRTH:  Jun 01, 1971  DATE OF ADMISSION:  10/21/2022  PRIMARY CARE PHYSICIAN: Antony Contras, MD   Patient is coming from: Home  REQUESTING/REFERRING PHYSICIAN: Ronny Bacon   CHIEF COMPLAINT:   Chief Complaint  Patient presents with   Abdominal Pain    HISTORY OF PRESENT ILLNESS:  Mateusz Neilan is a 52 y.o. African-American male with medical history significant for hypertension, dyslipidemia, type 2 diabetes mellitus and end-stage renal disease on hemodialysis, who presented to the emergency room with acute onset of recurrent nausea and vomiting that started at 5 PM with associated right upper quadrant abdominal pain which started this morning with no bilious vomitus or hematemesis.  Denies any diarrhea or melena or bright red bleeding per rectum.  He denies any fever or chills.  He has not had any hemodialysis at home today.  He usually gets it 4 times a week.  No chest pain or palpitations. ED Course: When he came to the ER, he was 151/66 with otherwise normal vital signs.  Labs revealed a BUN of 60 with creatinine 10.58, blood glucose of 158 and chloride 94 with sodium 135 and potassium 4.4.  Lipase was slightly elevated at 82 and AST 152 with ALT 143, total bili 2.5.  He is CBC showed anemia with hemoglobin 10.8 hematocrit 31.4 compared to 11/34.3 on 05/25/21.  Imaging: Abdominal pelvic CT scan revealed cecal pneumatosis favoring benign etiology and incidental with no evidence for inflammation ischemia or obstruction.  Exam was otherwise unremarkable. Right upper quadrant ultrasound revealed cholelithiasis without complicating factors.  The patient was given 4 mg of IV morphine sulfate and 4 mg of IV Zofran.  He will be admitted to a medical bed for further evaluation and management. PAST MEDICAL HISTORY:   Past Medical History:  Diagnosis Date   Chest pain    Chronic kidney disease    HTN  (hypertension)    Hyperlipidemia    Type II or unspecified type diabetes mellitus without mention of complication, not stated as uncontrolled    Wears glasses   End-stage renal disease on hemodialysis at home.  PAST SURGICAL HISTORY:   Past Surgical History:  Procedure Laterality Date   AV FISTULA PLACEMENT Left 08/24/2019   Procedure: CREATION OF ARTERIOVENOUS (AV) FISTULA  LEFT ARM;  Surgeon: [name], MD;  Location: [facility];  Service: Vascular;  Laterality: Left;   CIRCUMCISION, NON-NEWBORN  2004   COLONOSCOPY W/ BIOPSIES AND POLYPECTOMY     EYE SURGERY     FOOT SURGERY     IR FLUORO GUIDE CV LINE RIGHT  07/23/2019   IR FLUORO GUIDE CV LINE RIGHT  09/19/2019   IR US GUIDE VASC ACCESS RIGHT  07/23/2019   MULTIPLE TOOTH EXTRACTIONS      SOCIAL HISTORY:   Social History   Tobacco Use   Smoking status: Never   Smokeless tobacco: Never  Substance Use Topics   Alcohol use: Not Currently    FAMILY HISTORY:   Family History  Problem Relation Age of Onset   Colon cancer Father    Diabetes Other        family history   Hypertension Other        family history   Aneurysm Mother        died age 46, brain   Hypertension Mother    Hypertension Sister 25   Hypertension  Sister 16   Hypertension Brother 68    DRUG ALLERGIES:  No Known Allergies  REVIEW OF SYSTEMS:   ROS As per history of present illness. All pertinent systems were reviewed above. Constitutional, HEENT, cardiovascular, respiratory, GI, GU, musculoskeletal, neuro, psychiatric, endocrine, integumentary and hematologic systems were reviewed and are otherwise negative/unremarkable except for positive findings mentioned above in the HPI.   MEDICATIONS AT HOME:   Prior to Admission medications   Medication Sig Start Date End Date Taking? Authorizing Provider  amLODipine (NORVASC) 10 MG tablet Take 10 mg by mouth daily.   Yes [provider]  aspirin EC 81 MG tablet Take 81 mg by mouth daily.     Yes [provider]  calcitRIOL (ROCALTROL) 0.5 MCG capsule Take 0.5 mcg by mouth daily.   Yes [provider]  carvedilol (COREG) 12.5 MG tablet Take 12.5 mg by mouth daily as needed (for systolic greater than 500).   Yes [provider]  ferric citrate (AURYXIA) 1 GM 210 MG(Fe) tablet Take 210 mg by mouth 3 (three) times daily with meals.   Yes [provider]  heparin sodium, porcine, 1000 UNIT/ML injection Inject 2,000 Units into the vein See admin instructions. Patient states he receive 4 times a week per patient, no specific days. 02/04/20  Yes [provider]  iron sucrose (VENOFER) 20 MG/ML injection See admin instructions.   Yes [provider]  losartan (COZAAR) 50 MG tablet Take 50 mg by mouth daily. 10/02/19  Yes [provider]  Methoxy PEG-Epoetin Beta (MIRCERA IJ) Inject into the skin. 07/27/21  Yes [provider]  rosuvastatin (CRESTOR) 5 MG tablet Take 5 mg by mouth daily.  08/20/18  Yes [provider]  Semaglutide (OZEMPIC, 0.25 OR 0.5 MG/DOSE, [place]) Inject 0.5 mg into the skin once a week. Monday   Yes [provider]  hydrALAZINE (APRESOLINE) 100 MG tablet 1 tablet with food Patient not taking: Reported on 10/22/2022    [provider]      VITAL SIGNS:  Blood pressure (!) 159/67, pulse 77, temperature 97.8 F (36.6 C), temperature source Oral, resp. rate 19, height 5\' 10"  (1.778 m), weight 125 kg, SpO2 100 %.  PHYSICAL EXAMINATION:  Physical Exam  GENERAL:  52 y.o.-year-old African-American male patient lying in the bed with no acute distress.  EYES: Pupils equal, round, reactive to light and accommodation. No scleral icterus. Extraocular muscles intact.  HEENT: Head atraumatic, normocephalic. Oropharynx and nasopharynx clear.  NECK:  Supple, no jugular venous distention. No thyroid enlargement, no tenderness.  LUNGS: Normal breath sounds bilaterally, no wheezing,  rales,rhonchi or crepitation. No use of accessory muscles of respiration.  CARDIOVASCULAR: Regular rate and rhythm, S1, S2 normal. No murmurs, rubs, or gallops.  ABDOMEN: Soft, nondistended, with right upper quadrant abdominal tenderness without rebound tenderness guarding or rigidity and with negative Murphy sign.  Bowel sounds present. No organomegaly or mass.  EXTREMITIES: No pedal edema, cyanosis, or clubbing.  NEUROLOGIC: Cranial nerves II through XII are intact. Muscle strength 5/5 in all extremities. Sensation intact. Gait not checked.  PSYCHIATRIC: The patient is alert and oriented x 3.  Normal affect and good eye contact. SKIN: No obvious rash, lesion, or ulcer.   LABORATORY PANEL:   CBC Recent Labs  Lab 10/21/22 2015  WBC 5.2  HGB 10.8*  HCT 31.4*  PLT 183   ------------------------------------------------------------------------------------------------------------------  Chemistries  Recent Labs  Lab 10/21/22 2015  NA 135  K 4.4  CL 94*  CO2 25  GLUCOSE 158*  BUN 60*  CREATININE 10.58*  CALCIUM 9.6  AST 152*  ALT 143*  ALKPHOS 59  BILITOT 2.5*   ------------------------------------------------------------------------------------------------------------------  Cardiac Enzymes No results for input(s): "TROPONINI" in the last 168 hours. ------------------------------------------------------------------------------------------------------------------  RADIOLOGY:  CT ABDOMEN PELVIS WO CONTRAST  Result Date: 10/21/2022 CLINICAL DATA:  Right upper quadrant pain radiating to his back since this morning with nausea and vomiting EXAM: CT ABDOMEN AND PELVIS WITHOUT CONTRAST TECHNIQUE: Multidetector CT imaging of the abdomen and pelvis was performed following the standard protocol without IV contrast. RADIATION DOSE REDUCTION: This exam was performed according to the departmental dose-optimization program which includes automated exposure control, adjustment of the mA  and/or kV according to patient size and/or use of iterative reconstruction technique. COMPARISON:  CT 05/25/2021 FINDINGS: Lower chest: No acute abnormality. Hepatobiliary: No suspicious focal liver abnormality is seen. No gallstones, gallbladder wall thickening, or biliary dilatation. Pancreas: Unremarkable. No pancreatic ductal dilatation or surrounding inflammatory changes. Spleen: Normal in size without focal abnormality. Adrenals/Urinary Tract: Adrenal glands are unremarkable. Kidneys are normal, without renal calculi, suspicious focal lesion, or hydronephrosis. Bladder is unremarkable. Stomach/Bowel: Stomach is within normal limits. No evidence of bowel wall thickening, distention, or inflammatory changes. The appendix is normal. Cecal pneumatosis. No evidence of associated inflammation, ischemia, or obstruction. Vascular/Lymphatic: No significant vascular findings are present. No enlarged abdominal or pelvic lymph nodes. Reproductive: Unremarkable. Other: No free intraperitoneal fluid or air. Musculoskeletal: No acute or significant osseous findings. IMPRESSION: Cecal pneumatosis. Favored benign and incidental as there is no evidence of associated inflammation, ischemia or obstruction. Otherwise unremarkable noncontrast CT abdomen and pelvis. Electronically Signed   By: Placido Sou M.D.   On: 10/21/2022 22:21   US Abdomen Limited RUQ (LIVER/GB)  Result Date: 10/21/2022 CLINICAL DATA:  Right upper quadrant pain EXAM: ULTRASOUND ABDOMEN LIMITED RIGHT UPPER QUADRANT COMPARISON:  05/25/2021 FINDINGS: Gallbladder: Gallbladder is well distended with multiple gallstones within. No wall thickening or pericholecystic fluid is noted. Negative sonographic Murphy's sign is elicited. Common bile duct: Diameter: 4 mm. Liver: No focal lesion identified. Within normal limits in parenchymal echogenicity. Portal vein is patent on color Doppler imaging with normal direction of blood flow towards the liver. Other: None.  IMPRESSION: Cholelithiasis without complicating factors. Electronically Signed   By: Inez Catalina M.D.   On: 10/21/2022 20:49      IMPRESSION AND PLAN:  Assessment and Plan: * Symptomatic cholelithiasis - This is manifested by recurrent nausea and vomiting with right upper quadrant abdominal pain. - The patient will be admitted to a medical bed. - Pain management will be provided. - Will be hydrated with IV normal saline. - We will keep him n.p.o. after midnight. - General surgery consultation will be obtained. - Dr. Grandville Silos was notified and is aware about the patient.  End-stage renal disease on hemodialysis [facility]) - The patient will need nephrology consult in a.m. to resume his hemodialysis.  Elevated LFTs - We will follow LFTs  with hydration. - Will obtain viral hepatitis panel.  Type 2 diabetes mellitus with chronic kidney disease, without long-term current use of insulin ([place]) - The patient will be placed on supplemental coverage with NovoLog.  Dyslipidemia - We will continue his statin therapy.  Essential hypertension - We will use his antihypertensives.       DVT prophylaxis: Lovenox.  Advanced Care Planning:  Code Status: full code.  Family Communication:  The plan of care was discussed in details with the patient (and family).  I answered all questions. The patient agreed to proceed with the above mentioned plan. Further management will depend upon hospital course. Disposition Plan: Back to previous home environment Consults called: General surgery called.  Nephrology will need to be called in AM. All the records are reviewed and case discussed with ED provider.  Status is: Inpatient    At the time of the admission, it appears that the appropriate admission status for this patient is inpatient.  This is judged to be reasonable and necessary in order to provide the required intensity of service to ensure the patient's safety given the presenting symptoms,  physical exam findings and initial radiographic and laboratory data in the context of comorbid conditions.  The patient requires inpatient status due to high intensity of service, high risk of further deterioration and high frequency of surveillance required.  I certify that at the time of admission, it is my clinical judgment that the patient will require inpatient hospital care extending more than 2 midnights.                            Dispo: The patient is from: Home              Anticipated d/c is to: Home              Patient currently is not medically stable to d/c.              Difficult to place patient: No  Christel Mormon M.D on 10/22/2022 at 4:09 AM  [facility]   From 7 PM-7 AM, contact night-coverage [email or web address]  CC: Primary care physician; Antony Contras, MD

## 2022-10-22 NOTE — Assessment & Plan Note (Signed)
-   We will use his antihypertensives.

## 2022-10-22 NOTE — Assessment & Plan Note (Signed)
-   We will continue his statin therapy. 

## 2022-10-22 NOTE — Progress Notes (Signed)
Transport called and informed staff that patient refused to have dialysis tonight. Primary nurse informed.

## 2022-10-22 NOTE — Assessment & Plan Note (Signed)
-   We will follow LFTs  with hydration. - Will obtain viral hepatitis panel.

## 2022-10-22 NOTE — Progress Notes (Signed)
Pt refused nightly insulin and heparin. Pt stated "I've never taken insulin and I don't need it, and I only take heparin before I do dialysis." Pt educated on insulin and heparin.

## 2022-10-22 NOTE — TOC Progression Note (Signed)
Transition of Care (TOC) - Progression Note    Transition of Care (TOC) Screening Note   Patient Details  Name: Ray Bradley Date of Birth: Nov 10, 1970   Transition of Care Department [facility]) has reviewed patient and no TOC needs have been identified at this time. We will continue to monitor patient advancement through interdisciplinary progression rounds. If new patient transition needs arise, please place a TOC consult.   Patient Details  Name: Ray Bradley MRN: [id number] Date of Birth: [birth year]  Transition of Care [facility]) CM/SW Contact  Florrie Ramires, Edson Snowball, RN Phone Number: 10/22/2022, 10:58 AM  Clinical Narrative:            Expected Discharge Plan and Services                                               Social Determinants of Health (SDOH) Interventions SDOH Screenings   Food Insecurity: No Food Insecurity (10/22/2022)  Housing: Low Risk  (10/22/2022)  Transportation Needs: No Transportation Needs (10/22/2022)  Utilities: Not At Risk (10/22/2022)  Tobacco Use: Low Risk  (10/21/2022)    Readmission Risk Interventions     No data to display

## 2022-10-22 NOTE — Consult Note (Signed)
Reason for Consult: Gallstones abnormal liver tests Referring Physician: Hospital team  Ray Bradley is an 52 y.o. male.  HPI: Patient seen and examined in our office computer chart and his hospital computer chart reviewed and he is currently pain-free and did not know he had gallstones until he came to the hospital and his liver tests have been mildly elevated in the past and has been told he had a fatty liver and we did review his colonoscopy from last year and we reviewed his MRI which was okay from a CBD standpoint and answered all of his and his wife's questions  Past Medical History:  Diagnosis Date   Chest pain    Chronic kidney disease    HTN (hypertension)    Hyperlipidemia    Type II or unspecified type diabetes mellitus without mention of complication, not stated as uncontrolled    Wears glasses     Past Surgical History:  Procedure Laterality Date   AV FISTULA PLACEMENT Left 08/24/2019   Procedure: CREATION OF ARTERIOVENOUS (AV) FISTULA  LEFT ARM;  Surgeon: [name], MD;  Location: [facility] OR;  Service: Vascular;  Laterality: Left;   CIRCUMCISION, NON-NEWBORN  2004   COLONOSCOPY W/ BIOPSIES AND POLYPECTOMY     EYE SURGERY     FOOT SURGERY     IR FLUORO GUIDE CV LINE RIGHT  07/23/2019   IR FLUORO GUIDE CV LINE RIGHT  09/19/2019   IR US GUIDE VASC ACCESS RIGHT  07/23/2019   MULTIPLE TOOTH EXTRACTIONS      Family History  Problem Relation Age of Onset   Colon cancer Father    Diabetes Other        family history   Hypertension Other        family history   Aneurysm Mother        died age 89, brain   Hypertension Mother    Hypertension Sister 49   Hypertension Sister 64   Hypertension Brother 62    Social History:  reports that he has never smoked. He has never used smokeless tobacco. He reports that he does not currently use alcohol. He reports that he does not use drugs.  Allergies: No Known Allergies  Medications: I have reviewed the patient's  current medications.  Results for orders placed or performed during the hospital encounter of 10/21/22 (from the past 48 hour(s))  CBC with Differential     Status: Abnormal   Collection Time: 10/21/22  8:15 PM  Result Value Ref Range   WBC 5.2 4.0 - 10.5 K/uL   RBC 3.86 (L) 4.22 - 5.81 MIL/uL   Hemoglobin 10.8 (L) 13.0 - 17.0 g/dL   HCT 31.4 (L) 39.0 - 52.0 %   MCV 81.3 80.0 - 100.0 fL   MCH 28.0 26.0 - 34.0 pg   MCHC 34.4 30.0 - 36.0 g/dL   RDW 17.2 (H) 11.5 - 15.5 %   Platelets 183 150 - 400 K/uL   nRBC 0.0 0.0 - 0.2 %   Neutrophils Relative % 74 %   Neutro Abs 3.8 1.7 - 7.7 K/uL   Lymphocytes Relative 15 %   Lymphs Abs 0.8 0.7 - 4.0 K/uL   Monocytes Relative 11 %   Monocytes Absolute 0.6 0.1 - 1.0 K/uL   Eosinophils Relative 0 %   Eosinophils Absolute 0.0 0.0 - 0.5 K/uL   Basophils Relative 0 %   Basophils Absolute 0.0 0.0 - 0.1 K/uL   Immature Granulocytes 0 %   Abs  Immature Granulocytes 0.02 0.00 - 0.07 K/uL    Comment: Performed at [facility], [place] [address], [place], [place] [address]  Comprehensive metabolic panel     Status: Abnormal   Collection Time: 10/21/22  8:15 PM  Result Value Ref Range   Sodium 135 135 - 145 mmol/L   Potassium 4.4 3.5 - 5.1 mmol/L   Chloride 94 (L) 98 - 111 mmol/L   CO2 25 22 - 32 mmol/L   Glucose, Bld 158 (H) 70 - 99 mg/dL    Comment: Glucose reference range applies only to samples taken after fasting for at least 8 hours.   BUN 60 (H) 6 - 20 mg/dL   Creatinine, Ser 10.58 (H) 0.61 - 1.24 mg/dL   Calcium 9.6 8.9 - 10.3 mg/dL   Total Protein 7.7 6.5 - 8.1 g/dL   Albumin 3.8 3.5 - 5.0 g/dL   AST 152 (H) 15 - 41 U/L   ALT 143 (H) 0 - 44 U/L   Alkaline Phosphatase 59 38 - 126 U/L   Total Bilirubin 2.5 (H) 0.3 - 1.2 mg/dL   GFR, Estimated 5 (L) >60 mL/min    Comment: (NOTE) Calculated using the CKD-EPI Creatinine Equation (2021)    Anion gap 16 (H) 5 - 15    Comment: Performed at [facility], [place] [address],  [place], [place] [address]  Lipase, blood     Status: Abnormal   Collection Time: 10/21/22  8:15 PM  Result Value Ref Range   Lipase 82 (H) 11 - 51 U/L    Comment: Performed at [facility] [address] [place], [place] [address]  Comprehensive metabolic panel     Status: Abnormal   Collection Time: 10/22/22  8:10 AM  Result Value Ref Range   Sodium 137 135 - 145 mmol/L   Potassium 3.9 3.5 - 5.1 mmol/L   Chloride 94 (L) 98 - 111 mmol/L   CO2 27 22 - 32 mmol/L   Glucose, Bld 139 (H) 70 - 99 mg/dL    Comment: Glucose reference range applies only to samples taken after fasting for at least 8 hours.   BUN 68 (H) 6 - 20 mg/dL   Creatinine, Ser 11.72 (H) 0.61 - 1.24 mg/dL   Calcium 9.4 8.9 - 10.3 mg/dL   Total Protein 8.0 6.5 - 8.1 g/dL   Albumin 3.9 3.5 - 5.0 g/dL   AST 217 (H) 15 - 41 U/L   ALT 245 (H) 0 - 44 U/L   Alkaline Phosphatase 94 38 - 126 U/L   Total Bilirubin 4.7 (H) 0.3 - 1.2 mg/dL   GFR, Estimated 5 (L) >60 mL/min    Comment: (NOTE) Calculated using the CKD-EPI Creatinine Equation (2021)    Anion gap 16 (H) 5 - 15    Comment: Performed at [facility], [place] [address], [place], [place] [address]  CBC     Status: Abnormal   Collection Time: 10/22/22  8:10 AM  Result Value Ref Range   WBC 4.4 4.0 - 10.5 K/uL   RBC 3.98 (L) 4.22 - 5.81 MIL/uL   Hemoglobin 11.2 (L) 13.0 - 17.0 g/dL   HCT 32.3 (L) 39.0 - 52.0 %   MCV 81.2 80.0 - 100.0 fL   MCH 28.1 26.0 - 34.0 pg   MCHC 34.7 30.0 - 36.0 g/dL   RDW 17.0 (H) 11.5 - 15.5 %   Platelets 181 150 - 400 K/uL   nRBC 0.0  0.0 - 0.2 %    Comment: Performed at [facility], [place] [address], [place], [place] [address]  HIV Antibody (routine testing w rflx)     Status: None   Collection Time: 10/22/22  8:10 AM  Result Value Ref Range   HIV Screen 4th Generation wRfx Non Reactive Non Reactive    Comment: Performed at [facility], [place] [address], [place], [place] [address]  Hemoglobin A1c     Status: Abnormal    Collection Time: 10/22/22  8:10 AM  Result Value Ref Range   Hgb A1c MFr Bld 7.0 (H) 4.8 - 5.6 %    Comment: (NOTE) Pre diabetes:          5.7%-6.4%  Diabetes:              >6.4%  Glycemic control for   <7.0% adults with diabetes    Mean Plasma Glucose 154.2 mg/dL    Comment: Performed at [facility] [address] [place], [place] [address]  Hepatitis panel, acute     Status: None   Collection Time: 10/22/22  8:10 AM  Result Value Ref Range   Hepatitis B Surface Ag NON REACTIVE NON REACTIVE   HCV Ab NON REACTIVE NON REACTIVE    Comment: (NOTE) Nonreactive HCV antibody screen is consistent with no HCV infections,  unless recent infection is suspected or other evidence exists to indicate HCV infection.     Hep A IgM NON REACTIVE NON REACTIVE   Hep B C IgM NON REACTIVE NON REACTIVE    Comment: Performed at [facility], [place] [address], [place], [place] [address]  Glucose, capillary     Status: Abnormal   Collection Time: 10/22/22  8:28 AM  Result Value Ref Range   Glucose-Capillary 128 (H) 70 - 99 mg/dL    Comment: Glucose reference range applies only to samples taken after fasting for at least 8 hours.  Glucose, capillary     Status: Abnormal   Collection Time: 10/22/22 12:24 PM  Result Value Ref Range   Glucose-Capillary 123 (H) 70 - 99 mg/dL    Comment: Glucose reference range applies only to samples taken after fasting for at least 8 hours.  Glucose, capillary     Status: Abnormal   Collection Time: 10/22/22  5:34 PM  Result Value Ref Range   Glucose-Capillary 110 (H) 70 - 99 mg/dL    Comment: Glucose reference range applies only to samples taken after fasting for at least 8 hours.    MR ABDOMEN MRCP W WO CONTAST  Result Date: 10/22/2022 CLINICAL DATA:  Biliary obstruction suspected EXAM: MRI ABDOMEN WITHOUT AND WITH CONTRAST (INCLUDING MRCP) TECHNIQUE: Multiplanar multisequence MR imaging of the abdomen was performed both before and after the  administration of intravenous contrast. Heavily T2-weighted images of the biliary and pancreatic ducts were obtained, and three-dimensional MRCP images were rendered by post processing. CONTRAST:  30mL GADAVIST GADOBUTROL 1 MMOL/ML IV SOLN COMPARISON:  CT abdomen pelvis, right upper quadrant ultrasound, 10/21/2022 FINDINGS: Lower chest: No acute abnormality. Hepatobiliary: Hepatic iron deposition. Small gallstones. Mild gallbladder wall thickening and mucosal hyperenhancement of the gallbladder. No biliary ductal dilatation. Pancreas: Unremarkable. No pancreatic ductal dilatation or surrounding inflammatory changes. Spleen: Normal in size.  Splenic iron deposition. Adrenals/Urinary Tract: Adrenal glands are unremarkable. Numerous tiny bilateral renal cysts and subcentimeter lesions too small to confidently characterize although almost certainly additional cysts. Kidneys are otherwise normal, without obvious renal calculi, solid lesion,  or hydronephrosis. Stomach/Bowel: Stomach is within normal limits. No evidence of bowel wall thickening, distention, or inflammatory changes. Vascular/Lymphatic: No significant vascular findings are present. No enlarged abdominal lymph nodes. Other: No abdominal wall hernia or abnormality. No ascites. Musculoskeletal: No acute osseous findings.  Marrow iron deposition. IMPRESSION: 1. Small gallstones. Mild gallbladder wall thickening and mucosal hyperenhancement of the gallbladder. Correlate for evidence of acute cholecystitis. 2. No biliary ductal dilatation. 3. Hepatic, splenic, and marrow iron deposition. Electronically Signed   By: Delanna Ahmadi M.D.   On: 10/22/2022 17:29   CT ABDOMEN PELVIS WO CONTRAST  Result Date: 10/21/2022 CLINICAL DATA:  Right upper quadrant pain radiating to his back since this morning with nausea and vomiting EXAM: CT ABDOMEN AND PELVIS WITHOUT CONTRAST TECHNIQUE: Multidetector CT imaging of the abdomen and pelvis was performed following the standard  protocol without IV contrast. RADIATION DOSE REDUCTION: This exam was performed according to the departmental dose-optimization program which includes automated exposure control, adjustment of the mA and/or kV according to patient size and/or use of iterative reconstruction technique. COMPARISON:  CT 05/25/2021 FINDINGS: Lower chest: No acute abnormality. Hepatobiliary: No suspicious focal liver abnormality is seen. No gallstones, gallbladder wall thickening, or biliary dilatation. Pancreas: Unremarkable. No pancreatic ductal dilatation or surrounding inflammatory changes. Spleen: Normal in size without focal abnormality. Adrenals/Urinary Tract: Adrenal glands are unremarkable. Kidneys are normal, without renal calculi, suspicious focal lesion, or hydronephrosis. Bladder is unremarkable. Stomach/Bowel: Stomach is within normal limits. No evidence of bowel wall thickening, distention, or inflammatory changes. The appendix is normal. Cecal pneumatosis. No evidence of associated inflammation, ischemia, or obstruction. Vascular/Lymphatic: No significant vascular findings are present. No enlarged abdominal or pelvic lymph nodes. Reproductive: Unremarkable. Other: No free intraperitoneal fluid or air. Musculoskeletal: No acute or significant osseous findings. IMPRESSION: Cecal pneumatosis. Favored benign and incidental as there is no evidence of associated inflammation, ischemia or obstruction. Otherwise unremarkable noncontrast CT abdomen and pelvis. Electronically Signed   By: Placido Sou M.D.   On: 10/21/2022 22:21   US Abdomen Limited RUQ (LIVER/GB)  Result Date: 10/21/2022 CLINICAL DATA:  Right upper quadrant pain EXAM: ULTRASOUND ABDOMEN LIMITED RIGHT UPPER QUADRANT COMPARISON:  05/25/2021 FINDINGS: Gallbladder: Gallbladder is well distended with multiple gallstones within. No wall thickening or pericholecystic fluid is noted. Negative sonographic Murphy's sign is elicited. Common bile duct: Diameter: 4 mm.  Liver: No focal lesion identified. Within normal limits in parenchymal echogenicity. Portal vein is patent on color Doppler imaging with normal direction of blood flow towards the liver. Other: None. IMPRESSION: Cholelithiasis without complicating factors. Electronically Signed   By: Inez Catalina M.D.   On: 10/21/2022 20:49    ROS negative except above Blood pressure 135/74, pulse 72, temperature 97.8 F (36.6 C), temperature source Oral, resp. rate 18, height 5\' 10"  (1.778 m), weight 125 kg, SpO2 100 %. Physical Exam vital signs stable afebrile no acute distress abdomen is soft nontender labs CT and MRI reviewed white count normal on dialysis  Assessment/Plan: Gallstones and elevated liver test resolved abdominal pain Plan: With MRCP nonrevealing for CBD stones I would recommend a lap chole and IntraOp cholangiogram to double check the CBD and if that was negative depending on how the liver looks consider liver biopsy IntraOp and the patient is in a hurry to make a decision and the nurses put a call into the surgical team and I will be on standby to help as needed and we discussed my role with ERCP and briefly discussed that procedure  [place]ark[place] E 10/22/2022, 6:17 PM

## 2022-10-22 NOTE — ED Notes (Signed)
Floor made aware the patient was on their way up to them via transport.

## 2022-10-22 NOTE — Progress Notes (Signed)
PROGRESS NOTE  Ray Bradley:[id number] DOB: 12/31/1970 DOA: 10/21/2022 PCP: Antony Contras, MD   LOS: 1 day   Brief Narrative / Interim history: 52 year old male with history of HTN, DM 2, HLD, ESRD on IHD at home who comes into the hospital with recurrent nausea, vomiting that started after a meal.  He denies any fever or chills.  He had right upper quadrant abdominal pain when this happened, now resolved.  He was found to have cholelithiasis and LFT elevation, mild lipase elevation, general surgery was consulted and he was admitted to the hospital.  Subjective / 24h Interval events: Doing well this morning, denies any nausea, vomiting, reports no abdominal pain.  Assesement and Plan: Principal Problem:   Symptomatic cholelithiasis Active Problems:   End-stage renal disease on hemodialysis (HCC)   Elevated LFTs   Essential hypertension   Dyslipidemia   Type 2 diabetes mellitus with chronic kidney disease, without long-term current use of insulin (HCC)  Principal problem Symptomatic cholelithiasis -with some concern for mild pancreatitis.  LFT elevation noted, possible he may have passed a stone.  General surgery consulted, appreciate input, may need cholecystectomy this admission.  Bilirubin increasing some today, obtain an MRCP, surgical team request GI consultation.  Discussed with Dr. Watt Climes, will await on MRCP to see if interventions are needed  Active problems ESRD on HD -patient does he is on dialysis at home, currently has no acute dialysis needs.  Nephrology was consulted.  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  He will benefit from weight loss  Type 2 diabetes mellitus-controlled, A1c 7.0.  Placed on sliding scale.  CBG (last 3)  Recent Labs     10/22/22 0828  GLUCAP 128*    Lab Results  Component Value Date   HGBA1C 7.0 (H) 10/22/2022    Scheduled Meds:  amLODipine  10 mg Oral Daily   calcitRIOL  0.5 mcg Oral Daily   enoxaparin (LOVENOX) injection  40 mg Subcutaneous Q24H   ferric citrate  210 mg Oral TID WC   insulin aspart  0-15 Units Subcutaneous TID WC   insulin aspart  0-5 Units Subcutaneous QHS   losartan  50 mg Oral Daily   rosuvastatin  5 mg Oral Daily   Continuous Infusions: PRN Meds:.acetaminophen **OR** acetaminophen, carvedilol, labetalol, magnesium hydroxide, morphine injection, traZODone  Current Outpatient Medications  Medication Instructions   amLODipine (NORVASC) 10 mg, Oral, Daily   aspirin EC 81 mg, Oral, Daily   calcitRIOL (ROCALTROL) 0.5 mcg, Oral, Daily   carvedilol (COREG) 12.5 mg, Oral, Daily PRN   ferric citrate (AURYXIA) 210 mg, Oral, 3 times daily with meals   heparin sodium (porcine) 2,000 Units, Intravenous, See admin instructions, Patient states he receive 4 times a week per patient, no specific days.   hydrALAZINE (APRESOLINE) 100 MG tablet 1 tablet with food   iron sucrose (VENOFER) 20 MG/ML injection See admin instructions   losartan (COZAAR) 50 mg, Oral, Daily   Methoxy PEG-Epoetin Beta (MIRCERA IJ) Subcutaneous   rosuvastatin (CRESTOR) 5 mg, Oral, Daily   Semaglutide (OZEMPIC, 0.25 OR 0.5 MG/DOSE, [place]) 0.5 mg, Subcutaneous, Weekly, Monday<BR>    Diet Orders (From admission, onward)     Start     Ordered   10/22/22 0412  Diet NPO time specified  Diet effective  now        10/22/22 0411            DVT prophylaxis: enoxaparin (LOVENOX) injection 40 mg Start: 10/22/22 0800   Lab Results  Component Value Date   PLT 181 10/22/2022      Code Status: Full Code  Family Communication: s/o at bedside   Status is: Inpatient  Remains inpatient appropriate because: severity of illness  Level of care: Med-Surg  Consultants:  Nephrology GI General surgery    Objective: Vitals:   10/22/22 0157 10/22/22 0429 10/22/22 0431 10/22/22 0825  BP: (!) 159/67 (!) 101/46 (!) 101/46 136/68  Pulse: 77 69 69 66  Resp: 19 16 16 18   Temp: 97.8 F (36.6 C) 98.3 F (36.8 C) 98.3 F (36.8 C) (!) 97.5 F (36.4 C)  TempSrc: Oral Oral Oral Oral  SpO2: 100% 96% 96% 100%  Weight:      Height:        Intake/Output Summary (Last 24 hours) at 10/22/2022 2979 Last data filed at 10/22/2022 0154 Gross per 24 hour  Intake --  Output 0 ml  Net 0 ml   Wt Readings from Last 3 Encounters:  10/21/22 125 kg  10/09/19 124.7 kg  08/24/19 122.5 kg    Examination:  Constitutional: NAD Eyes: no scleral icterus ENMT: Mucous membranes are moist.  Neck: normal, supple Respiratory: clear to auscultation bilaterally, no wheezing, no crackles. Normal respiratory effort. No accessory muscle use.  Cardiovascular: Regular rate and rhythm, no murmurs / rubs / gallops. No LE edema.  Abdomen: non distended, no tenderness. Bowel sounds positive.  Musculoskeletal: no clubbing / cyanosis.   Data Reviewed: I have independently reviewed following labs and imaging studies   CBC Recent Labs  Lab 10/21/22 2015 10/22/22 0810  WBC 5.2 4.4  HGB 10.8* 11.2*  HCT 31.4* 32.3*  PLT 183 181  MCV 81.3 81.2  MCH 28.0 28.1  MCHC 34.4 34.7  RDW 17.2* 17.0*  LYMPHSABS 0.8  --   MONOABS 0.6  --   EOSABS 0.0  --   BASOSABS 0.0  --     Recent Labs  Lab 10/21/22 2015 10/22/22 0810  NA 135 137  K 4.4 3.9  CL 94* 94*  CO2 25 27  GLUCOSE 158* 139*  BUN 60* 68*  CREATININE 10.58* 11.72*  CALCIUM 9.6 9.4  AST 152* 217*  ALT 143* 245*  ALKPHOS 59 94  BILITOT 2.5* 4.7*  ALBUMIN 3.8 3.9  HGBA1C  --  7.0*    ------------------------------------------------------------------------------------------------------------------ No results for input(s): "CHOL", "HDL", "LDLCALC", "TRIG", "CHOLHDL", "LDLDIRECT" in the last 72 hours.  Lab Results  Component Value Date   HGBA1C 7.0  (H) 10/22/2022   ------------------------------------------------------------------------------------------------------------------ No results for input(s): "TSH", "T4TOTAL", "T3FREE", "THYROIDAB" in the last 72 hours.  Invalid input(s): "FREET3"  Cardiac Enzymes No results for input(s): "CKMB", "TROPONINI", "MYOGLOBIN" in the last 168 hours.  Invalid input(s): "CK" ------------------------------------------------------------------------------------------------------------------ No results found for: "BNP"  CBG: Recent Labs  Lab 10/22/22 0828  GLUCAP 128*    No results found for this or any previous visit (from the past 240 hour(s)).   Radiology Studies: CT ABDOMEN PELVIS WO CONTRAST  Result Date: 10/21/2022 CLINICAL DATA:  Right upper quadrant pain radiating to his back since this morning with nausea and vomiting EXAM: CT ABDOMEN AND PELVIS WITHOUT CONTRAST TECHNIQUE: Multidetector CT imaging of the abdomen and pelvis was performed following the standard protocol without IV contrast. RADIATION DOSE REDUCTION: This exam was performed according  to the departmental dose-optimization program which includes automated exposure control, adjustment of the mA and/or kV according to patient size and/or use of iterative reconstruction technique. COMPARISON:  CT 05/25/2021 FINDINGS: Lower chest: No acute abnormality. Hepatobiliary: No suspicious focal liver abnormality is seen. No gallstones, gallbladder wall thickening, or biliary dilatation. Pancreas: Unremarkable. No pancreatic ductal dilatation or surrounding inflammatory changes. Spleen: Normal in size without focal abnormality. Adrenals/Urinary Tract: Adrenal glands are unremarkable. Kidneys are normal, without renal calculi, suspicious focal lesion, or hydronephrosis. Bladder is unremarkable. Stomach/Bowel: Stomach is within normal limits. No evidence of bowel wall thickening, distention, or inflammatory changes. The appendix is normal. Cecal  pneumatosis. No evidence of associated inflammation, ischemia, or obstruction. Vascular/Lymphatic: No significant vascular findings are present. No enlarged abdominal or pelvic lymph nodes. Reproductive: Unremarkable. Other: No free intraperitoneal fluid or air. Musculoskeletal: No acute or significant osseous findings. IMPRESSION: Cecal pneumatosis. Favored benign and incidental as there is no evidence of associated inflammation, ischemia or obstruction. Otherwise unremarkable noncontrast CT abdomen and pelvis. Electronically Signed   By: Placido Sou M.D.   On: 10/21/2022 22:21   US Abdomen Limited RUQ (LIVER/GB)  Result Date: 10/21/2022 CLINICAL DATA:  Right upper quadrant pain EXAM: ULTRASOUND ABDOMEN LIMITED RIGHT UPPER QUADRANT COMPARISON:  05/25/2021 FINDINGS: Gallbladder: Gallbladder is well distended with multiple gallstones within. No wall thickening or pericholecystic fluid is noted. Negative sonographic Murphy's sign is elicited. Common bile duct: Diameter: 4 mm. Liver: No focal lesion identified. Within normal limits in parenchymal echogenicity. Portal vein is patent on color Doppler imaging with normal direction of blood flow towards the liver. Other: None. IMPRESSION: Cholelithiasis without complicating factors. Electronically Signed   By: Inez Catalina M.D.   On: 10/21/2022 20:49     Marzetta Board, MD, PhD [facility]  Between 7 am - 7 pm I am available, please contact me via Amion (for emergencies) or Securechat (non urgent messages)  Between 7 pm - 7 am I am not available, please contact night coverage MD/APP via Amion

## 2022-10-22 NOTE — Assessment & Plan Note (Signed)
-   The patient will need nephrology consult in a.m. to resume his hemodialysis.

## 2022-10-22 NOTE — Assessment & Plan Note (Signed)
-   The patient will be placed on supplemental coverage with [employer]. 

## 2022-10-22 NOTE — Consult Note (Signed)
Reason for Consult:abdominal pain, elevated LFTs Referring Physician: Eugenie Norrie  Ray Bradley is an 52 y.o. male.  HPI: 52yo M with PMHx HTN and ESRD on HD for 4 years presented to the emergency department complaining of upper abdominal pain and vomiting.  He initially underwent CT scan of the abdomen and pelvis which showed some cecal pneumatosis which appear benign.  He was noted to have elevated liver function tests.  Right upper quadrant ultrasound was done showing gallstones without any complicating features.  I was asked to see him for surgical management.  Past Medical History:  Diagnosis Date   Chest pain    Chronic kidney disease    HTN (hypertension)    Hyperlipidemia    Type II or unspecified type diabetes mellitus without mention of complication, not stated as uncontrolled    Wears glasses     Past Surgical History:  Procedure Laterality Date   AV FISTULA PLACEMENT Left 08/24/2019   Procedure: CREATION OF ARTERIOVENOUS (AV) FISTULA  LEFT ARM;  Surgeon: [name], MD;  Location: [facility] OR;  Service: Vascular;  Laterality: Left;   CIRCUMCISION, NON-NEWBORN  2004   COLONOSCOPY W/ BIOPSIES AND POLYPECTOMY     EYE SURGERY     FOOT SURGERY     IR FLUORO GUIDE CV LINE RIGHT  07/23/2019   IR FLUORO GUIDE CV LINE RIGHT  09/19/2019   IR US GUIDE VASC ACCESS RIGHT  07/23/2019   MULTIPLE TOOTH EXTRACTIONS      Family History  Problem Relation Age of Onset   Colon cancer Father    Diabetes Other        family history   Hypertension Other        family history   Aneurysm Mother        died age 35, brain   Hypertension Mother    Hypertension Sister 41   Hypertension Sister 7   Hypertension Brother 50    Social History:  reports that he has never smoked. He has never used smokeless tobacco. He reports that he does not currently use alcohol. He reports that he does not use drugs.  Allergies: No Known Allergies  Medications: I have reviewed the patient's current  medications.  Results for orders placed or performed during the hospital encounter of 10/21/22 (from the past 48 hour(s))  CBC with Differential     Status: Abnormal   Collection Time: 10/21/22  8:15 PM  Result Value Ref Range   WBC 5.2 4.0 - 10.5 K/uL   RBC 3.86 (L) 4.22 - 5.81 MIL/uL   Hemoglobin 10.8 (L) 13.0 - 17.0 g/dL   HCT 31.4 (L) 39.0 - 52.0 %   MCV 81.3 80.0 - 100.0 fL   MCH 28.0 26.0 - 34.0 pg   MCHC 34.4 30.0 - 36.0 g/dL   RDW 17.2 (H) 11.5 - 15.5 %   Platelets 183 150 - 400 K/uL   nRBC 0.0 0.0 - 0.2 %   Neutrophils Relative % 74 %   Neutro Abs 3.8 1.7 - 7.7 K/uL   Lymphocytes Relative 15 %   Lymphs Abs 0.8 0.7 - 4.0 K/uL   Monocytes Relative 11 %   Monocytes Absolute 0.6 0.1 - 1.0 K/uL   Eosinophils Relative 0 %   Eosinophils Absolute 0.0 0.0 - 0.5 K/uL   Basophils Relative 0 %   Basophils Absolute 0.0 0.0 - 0.1 K/uL   Immature Granulocytes 0 %   Abs Immature Granulocytes 0.02 0.00 - 0.07 K/uL  Comment: Performed at [facility], [place] [address], [place], [place] [address]  Comprehensive metabolic panel     Status: Abnormal   Collection Time: 10/21/22  8:15 PM  Result Value Ref Range   Sodium 135 135 - 145 mmol/L   Potassium 4.4 3.5 - 5.1 mmol/L   Chloride 94 (L) 98 - 111 mmol/L   CO2 25 22 - 32 mmol/L   Glucose, Bld 158 (H) 70 - 99 mg/dL    Comment: Glucose reference range applies only to samples taken after fasting for at least 8 hours.   BUN 60 (H) 6 - 20 mg/dL   Creatinine, Ser 10.58 (H) 0.61 - 1.24 mg/dL   Calcium 9.6 8.9 - 10.3 mg/dL   Total Protein 7.7 6.5 - 8.1 g/dL   Albumin 3.8 3.5 - 5.0 g/dL   AST 152 (H) 15 - 41 U/L   ALT 143 (H) 0 - 44 U/L   Alkaline Phosphatase 59 38 - 126 U/L   Total Bilirubin 2.5 (H) 0.3 - 1.2 mg/dL   GFR, Estimated 5 (L) >60 mL/min    Comment: (NOTE) Calculated using the CKD-EPI Creatinine Equation (2021)    Anion gap 16 (H) 5 - 15    Comment: Performed at [facility], [place] [address] [place], [place]  [address]  Lipase, blood     Status: Abnormal   Collection Time: 10/21/22  8:15 PM  Result Value Ref Range   Lipase 82 (H) 11 - 51 U/L    Comment: Performed at [facility] [address], [place], [place] [address]    CT ABDOMEN PELVIS WO CONTRAST  Result Date: 10/21/2022 CLINICAL DATA:  Right upper quadrant pain radiating to his back since this morning with nausea and vomiting EXAM: CT ABDOMEN AND PELVIS WITHOUT CONTRAST TECHNIQUE: Multidetector CT imaging of the abdomen and pelvis was performed following the standard protocol without IV contrast. RADIATION DOSE REDUCTION: This exam was performed according to the departmental dose-optimization program which includes automated exposure control, adjustment of the mA and/or kV according to patient size and/or use of iterative reconstruction technique. COMPARISON:  CT 05/25/2021 FINDINGS: Lower chest: No acute abnormality. Hepatobiliary: No suspicious focal liver abnormality is seen. No gallstones, gallbladder wall thickening, or biliary dilatation. Pancreas: Unremarkable. No pancreatic ductal dilatation or surrounding inflammatory changes. Spleen: Normal in size without focal abnormality. Adrenals/Urinary Tract: Adrenal glands are unremarkable. Kidneys are normal, without renal calculi, suspicious focal lesion, or hydronephrosis. Bladder is unremarkable. Stomach/Bowel: Stomach is within normal limits. No evidence of bowel wall thickening, distention, or inflammatory changes. The appendix is normal. Cecal pneumatosis. No evidence of associated inflammation, ischemia, or obstruction. Vascular/Lymphatic: No significant vascular findings are present. No enlarged abdominal or pelvic lymph nodes. Reproductive: Unremarkable. Other: No free intraperitoneal fluid or air. Musculoskeletal: No acute or significant osseous findings. IMPRESSION: Cecal pneumatosis. Favored benign and incidental as there is no evidence of associated inflammation, ischemia or obstruction.  Otherwise unremarkable noncontrast CT abdomen and pelvis. Electronically Signed   By: Placido Sou M.D.   On: 10/21/2022 22:21   US Abdomen Limited RUQ (LIVER/GB)  Result Date: 10/21/2022 CLINICAL DATA:  Right upper quadrant pain EXAM: ULTRASOUND ABDOMEN LIMITED RIGHT UPPER QUADRANT COMPARISON:  05/25/2021 FINDINGS: Gallbladder: Gallbladder is well distended with multiple gallstones within. No wall thickening or pericholecystic fluid is noted. Negative sonographic Murphy's sign is elicited. Common bile duct: Diameter: 4 mm. Liver: No focal lesion identified. Within normal limits in parenchymal echogenicity. Portal vein is  patent on color Doppler imaging with normal direction of blood flow towards the liver. Other: None. IMPRESSION: Cholelithiasis without complicating factors. Electronically Signed   By: Inez Catalina M.D.   On: 10/21/2022 20:49    Review of Systems  Constitutional:  Positive for appetite change.  HENT: Negative.    Eyes: Negative.   Respiratory: Negative.    Cardiovascular: Negative.   Gastrointestinal:  Positive for abdominal pain, nausea and vomiting.  Endocrine: Negative.   Genitourinary:        ESRD  Musculoskeletal: Negative.   Allergic/Immunologic: Negative.   Neurological: Negative.   Hematological: Negative.   Psychiatric/Behavioral: Negative.     Blood pressure (!) 101/46, pulse 69, temperature 98.3 F (36.8 C), temperature source Oral, resp. rate 16, height 5\' 10"  (1.778 m), weight 125 kg, SpO2 96 %. Physical Exam HENT:     Head: Normocephalic.  Cardiovascular:     Rate and Rhythm: Normal rate and regular rhythm.  Pulmonary:     Effort: Pulmonary effort is normal.     Breath sounds: Normal breath sounds.  Abdominal:     General: Abdomen is flat.     Palpations: Abdomen is soft.     Tenderness: There is abdominal tenderness in the right upper quadrant.     Comments: Very mild right upper quadrant tenderness, no right lower quadrant tenderness, no masses   Skin:    General: Skin is warm.  Neurological:     Mental Status: He is alert and oriented to person, place, and time.  Psychiatric:        Mood and Affect: Mood normal.     Assessment/Plan: Likely choledocholithiasis -may have passed a stone.  Does not appear to have significant cholecystitis.  Agree with medical admission and repeat labs.  NPO except meds. May need MRCP and GI eval if liver function tests remain elevated.  We will follow and consider cholecystectomy this admission.  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  Zenovia Jarred 10/22/2022, 6:19 AM

## 2022-10-22 NOTE — ED Notes (Signed)
Patient reports he makes very little urine, unable to give urine sample at this time.

## 2022-10-22 NOTE — Assessment & Plan Note (Signed)
-   This is manifested by recurrent nausea and vomiting with right upper quadrant abdominal pain. - The patient will be admitted to a medical bed. - Pain management will be provided. - Will be hydrated with IV normal saline. - We will keep him n.p.o. after midnight. - General surgery consultation will be obtained. - Dr. Grandville Silos was notified and is aware about the patient.

## 2022-10-23 DIAGNOSIS — K8020 Calculus of gallbladder without cholecystitis without obstruction: Secondary | ICD-10-CM | POA: Diagnosis not present

## 2022-10-23 LAB — CBC
HCT: 29.6 % — ABNORMAL LOW (ref 39.0–52.0)
Hemoglobin: 9.8 g/dL — ABNORMAL LOW (ref 13.0–17.0)
MCH: 27.2 pg (ref 26.0–34.0)
MCHC: 33.1 g/dL (ref 30.0–36.0)
MCV: 82.2 fL (ref 80.0–100.0)
Platelets: 181 10*3/uL (ref 150–400)
RBC: 3.6 MIL/uL — ABNORMAL LOW (ref 4.22–5.81)
RDW: 17.1 % — ABNORMAL HIGH (ref 11.5–15.5)
WBC: 3.8 10*3/uL — ABNORMAL LOW (ref 4.0–10.5)
nRBC: 0 % (ref 0.0–0.2)

## 2022-10-23 LAB — COMPREHENSIVE METABOLIC PANEL
ALT: 230 U/L — ABNORMAL HIGH (ref 0–44)
AST: 131 U/L — ABNORMAL HIGH (ref 15–41)
Albumin: 3.3 g/dL — ABNORMAL LOW (ref 3.5–5.0)
Alkaline Phosphatase: 99 U/L (ref 38–126)
Anion gap: 17 — ABNORMAL HIGH (ref 5–15)
BUN: 80 mg/dL — ABNORMAL HIGH (ref 6–20)
CO2: 25 mmol/L (ref 22–32)
Calcium: 8.8 mg/dL — ABNORMAL LOW (ref 8.9–10.3)
Chloride: 96 mmol/L — ABNORMAL LOW (ref 98–111)
Creatinine, Ser: 14.28 mg/dL — ABNORMAL HIGH (ref 0.61–1.24)
GFR, Estimated: 4 mL/min — ABNORMAL LOW (ref >60)
Glucose, Bld: 146 mg/dL — ABNORMAL HIGH (ref 70–99)
Potassium: 4.4 mmol/L (ref 3.5–5.1)
Sodium: 138 mmol/L (ref 135–145)
Total Bilirubin: 4.6 mg/dL — ABNORMAL HIGH (ref 0.3–1.2)
Total Protein: 6.7 g/dL (ref 6.5–8.1)

## 2022-10-23 LAB — MAGNESIUM: Magnesium: 2.6 mg/dL — ABNORMAL HIGH (ref 1.7–2.4)

## 2022-10-23 NOTE — Progress Notes (Signed)
PATIENT LEFT AGAINST MEDICAL ADVICE- left before being seen today by surgical team.

## 2022-10-23 NOTE — Discharge Summary (Signed)
Physician [facility] Discharge Summary  Ray Bradley CWC:[id number] DOB: 10-13-1970 DOA: 10/21/2022  PCP: Ray Contras, MD  Admit date: 10/21/2022 Discharge date: 10/23/2022  Admitted From: home Disposition:  home / left AMA  Recommendations for Outpatient Follow-up:  Follow up with PCP ASAP  HPI: Per admitting MD, Ray Bradley is a 52 y.o. African-American male with medical history significant for hypertension, dyslipidemia, type 2 diabetes mellitus and end-stage renal disease on hemodialysis, who presented to the emergency room with acute onset of recurrent nausea and vomiting that started at 5 PM with associated right upper quadrant abdominal pain which started this morning with no bilious vomitus or hematemesis.  Denies any diarrhea or melena or bright red bleeding per rectum.  He denies any fever or chills.  He has not had any hemodialysis at home today.  He usually gets it 4 times a week.  No chest pain or palpitations.   Hospital Course / Discharge diagnoses: Principal Problem:   Symptomatic cholelithiasis Active Problems:   End-stage renal disease on hemodialysis (HCC)   Elevated LFTs   Essential hypertension   Dyslipidemia   Type 2 diabetes mellitus with chronic kidney disease, without long-term current use of insulin (HCC)   Principal problem Symptomatic cholelithiasis -with some concern for mild pancreatitis.  LFT elevation noted, possible he may have passed a stone.  GI and general surgery consulted.  An MRCP did not show evidence of choledocholithiasis.  He was scheduled to have cholecystectomy, timing was yet to be decided by surgery, however patient decided he does not wish to wait anymore and left home AMA.  Patient left before he was seen by me.   Active problems ESRD on HD -patient does he is on dialysis at home, currently has no acute dialysis needs.  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  He will benefit from weight loss Type 2 diabetes mellitus-controlled, A1c 7.0.   Sepsis ruled out   Discharge Instructions   Allergies as of 10/23/2022   No Known Allergies   Med Rec must be completed prior to using this [facility]   Consultations: General surgery  Nephrology   Procedures/Studies:  MR ABDOMEN MRCP W WO CONTAST  Result Date: 10/22/2022 CLINICAL DATA:  Biliary obstruction suspected EXAM: MRI ABDOMEN WITHOUT AND WITH CONTRAST (INCLUDING MRCP) TECHNIQUE: Multiplanar multisequence MR imaging of the abdomen was performed both before and after the administration of intravenous contrast. Heavily T2-weighted images of the biliary and pancreatic ducts were obtained, and three-dimensional MRCP images were rendered by post processing. CONTRAST:  32mL GADAVIST GADOBUTROL 1 MMOL/ML IV SOLN COMPARISON:  CT abdomen pelvis, right upper quadrant ultrasound, 10/21/2022 FINDINGS: Lower chest: No acute abnormality. Hepatobiliary: Hepatic iron deposition. Small gallstones. Mild gallbladder wall thickening and mucosal hyperenhancement of the gallbladder. No biliary ductal dilatation. Pancreas: Unremarkable. No pancreatic ductal dilatation or surrounding inflammatory changes. Spleen: Normal in size.  Splenic iron deposition. Adrenals/Urinary Tract: Adrenal glands are unremarkable. Numerous tiny bilateral renal cysts and subcentimeter lesions too small to confidently characterize although almost certainly additional cysts. Kidneys are otherwise normal, without obvious renal calculi, solid lesion, or hydronephrosis. Stomach/Bowel: Stomach is within normal limits. No evidence of bowel wall thickening, distention, or inflammatory changes. Vascular/Lymphatic: No significant vascular findings are present. No enlarged abdominal lymph nodes. Other: No abdominal wall hernia or abnormality. No ascites. Musculoskeletal: No acute osseous  findings.  Marrow iron deposition. IMPRESSION: 1. Small gallstones. Mild gallbladder wall thickening and mucosal hyperenhancement of the gallbladder. Correlate for evidence of acute cholecystitis. 2. No biliary ductal dilatation. 3. Hepatic, splenic, and marrow iron deposition. Electronically Signed   By: Delanna Ahmadi M.D.   On: 10/22/2022 17:29   CT ABDOMEN PELVIS WO CONTRAST  Result Date: 10/21/2022 CLINICAL DATA:  Right upper quadrant pain radiating to his back since this morning with nausea and vomiting EXAM: CT ABDOMEN AND PELVIS WITHOUT CONTRAST TECHNIQUE: Multidetector CT imaging of the abdomen and pelvis was performed following the standard protocol without IV contrast. RADIATION DOSE REDUCTION: This exam was performed according to the departmental dose-optimization program which includes automated exposure control, adjustment of the mA and/or kV according to patient size and/or use of iterative reconstruction technique. COMPARISON:  CT 05/25/2021 FINDINGS: Lower chest: No acute abnormality. Hepatobiliary: No suspicious focal liver abnormality is seen. No gallstones, gallbladder wall thickening, or biliary dilatation. Pancreas: Unremarkable. No pancreatic ductal dilatation or surrounding inflammatory changes. Spleen: Normal in size without focal abnormality. Adrenals/Urinary Tract: Adrenal glands are unremarkable. Kidneys are normal, without renal calculi, suspicious focal lesion, or hydronephrosis. Bladder is unremarkable. Stomach/Bowel: Stomach is within normal limits. No evidence of bowel wall thickening, distention, or inflammatory changes. The appendix is normal. Cecal pneumatosis. No evidence of associated inflammation, ischemia, or obstruction. Vascular/Lymphatic: No significant vascular findings are present. No enlarged abdominal or pelvic lymph nodes. Reproductive: Unremarkable. Other: No free intraperitoneal fluid or air. Musculoskeletal: No acute or significant osseous findings. IMPRESSION:  Cecal pneumatosis. Favored benign and incidental as there is no evidence of associated inflammation, ischemia or obstruction. Otherwise unremarkable noncontrast CT abdomen and pelvis. Electronically Signed   By: Placido Sou M.D.   On: 10/21/2022 22:21   US Abdomen Limited RUQ (LIVER/GB)  Result Date: 10/21/2022 CLINICAL DATA:  Right upper quadrant pain EXAM: ULTRASOUND ABDOMEN LIMITED RIGHT UPPER QUADRANT COMPARISON:  05/25/2021 FINDINGS: Gallbladder: Gallbladder is well distended with multiple gallstones within. No wall thickening or pericholecystic fluid is noted. Negative sonographic Murphy's sign is elicited. Common bile duct: Diameter: 4 mm. Liver: No focal lesion identified. Within normal limits in parenchymal echogenicity. Portal vein is patent on color Doppler imaging with normal direction of blood flow towards the liver. Other: None. IMPRESSION: Cholelithiasis without complicating factors. Electronically Signed   By: Inez Catalina M.D.   On: 10/21/2022 20:49      The results of significant diagnostics from this hospitalization (including imaging, microbiology, ancillary and laboratory) are listed below for reference.     Microbiology: No results found for this or any previous visit (from the past 240 hour(s)).   Labs: Basic Metabolic Panel: Recent Labs  Lab 10/21/22 2015 10/22/22 0810 10/23/22 0224  NA 135 137 138  K 4.4 3.9 4.4  CL 94* 94* 96*  CO2 25 27 25   GLUCOSE 158* 139* 146*  BUN 60* 68* 80*  CREATININE 10.58* 11.72* 14.28*  CALCIUM 9.6 9.4 8.8*  MG  --   --  2.6*   Liver Function Tests: Recent Labs  Lab 10/21/22 2015 10/22/22 0810 10/23/22 0224  AST 152* 217* 131*  ALT 143* 245* 230*  ALKPHOS 59 94 99  BILITOT 2.5* 4.7* 4.6*  PROT 7.7 8.0 6.7  ALBUMIN 3.8 3.9 3.3*   CBC: Recent Labs  Lab 10/21/22 2015 10/22/22 0810 10/23/22 0224  WBC 5.2 4.4 3.8*  NEUTROABS 3.8  --   --   HGB 10.8* 11.2* 9.8*  HCT 31.4* 32.3* 29.6*  MCV 81.3  81.2 82.2  PLT 183  181 181   CBG: Recent Labs  Lab 10/22/22 0828 10/22/22 1224 10/22/22 1734 10/22/22 1951  GLUCAP 128* 123* 110* 185*   Hgb A1c Recent Labs    10/22/22 0810  HGBA1C 7.0*   Lipid Profile No results for input(s): "CHOL", "HDL", "LDLCALC", "TRIG", "CHOLHDL", "LDLDIRECT" in the last 72 hours. Thyroid function studies No results for input(s): "TSH", "T4TOTAL", "T3FREE", "THYROIDAB" in the last 72 hours.  Invalid input(s): "FREET3" Urinalysis    Component Value Date/Time   COLORURINE YELLOW 05/25/2021 1106   APPEARANCEUR HAZY (A) 05/25/2021 1106   LABSPEC 1.005 05/25/2021 1106   PHURINE 6.0 05/25/2021 1106   GLUCOSEU 50 (A) 05/25/2021 1106   HGBUR MODERATE (A) 05/25/2021 1106   BILIRUBINUR NEGATIVE 05/25/2021 1106   BILIRUBINUR negative 05/25/2021 1001   KETONESUR NEGATIVE 05/25/2021 1106   KETONESUR negative 05/25/2021 1001   PROTEINUR 100 (A) 05/25/2021 1106   PROTEINUR =100 (A) 05/25/2021 1001   UROBILINOGEN 0.2 05/25/2021 1001   NITRITE NEGATIVE 05/25/2021 1106   NITRITE Negative 05/25/2021 1001   LEUKOCYTESUR LARGE (A) 05/25/2021 1106   LEUKOCYTESUR Trace (A) 05/25/2021 1001    FURTHER DISCHARGE INSTRUCTIONS:   Get Medicines reviewed and adjusted: Please take all your medications with you for your next visit with your Primary MD   Laboratory/radiological data: Please request your Primary MD to go over all hospital tests and procedure/radiological results at the follow up, please ask your Primary MD to get all Hospital records sent to his/her office.   In some cases, they will be blood work, cultures and biopsy results pending at the time of your discharge. Please request that your primary care M.D. goes through all the records of your hospital data and follows up on these results.   Also Note the following: If you experience worsening of your admission symptoms, develop shortness of breath, life threatening emergency, suicidal or homicidal thoughts you must seek  medical attention immediately by calling 911 or calling your MD immediately  if symptoms less severe.   You must read complete instructions/literature along with all the possible adverse reactions/side effects for all the Medicines you take and that have been prescribed to you. Take any new Medicines after you have completely understood and accpet all the possible adverse reactions/side effects.    Do not drive when taking Pain medications or sleeping medications (Benzodaizepines)   Do not take more than prescribed Pain, Sleep and Anxiety Medications. It is not advisable to combine anxiety,sleep and pain medications without talking with your primary care practitioner   Special Instructions: If you have smoked or chewed Tobacco  in the last 2 yrs please stop smoking, stop any regular Alcohol  and or any Recreational drug use.   Wear Seat belts while driving.   Please note: You were cared for by a hospitalist during your hospital stay. Once you are discharged, your primary care physician will handle any further medical issues. Please note that NO REFILLS for any discharge medications will be authorized once you are discharged, as it is imperative that you return to your primary care physician (or establish a relationship with a primary care physician if you do not have one) for your post hospital discharge needs so that they can reassess your need for medications and monitor your lab values.  Time coordinating discharge: 10 minutes  SIGNED:  Marzetta Board, MD, PhD 10/23/2022, 8:33 AM

## 2022-10-24 ENCOUNTER — Other Ambulatory Visit: Payer: Self-pay

## 2022-10-24 ENCOUNTER — Encounter (HOSPITAL_COMMUNITY): Payer: Self-pay | Admitting: Surgery

## 2022-10-24 LAB — HEPATITIS B SURFACE ANTIBODY, QUANTITATIVE: Hep B S AB Quant (Post): 27.3 m[IU]/mL (ref >9.9)

## 2022-10-24 NOTE — Anesthesia Preprocedure Evaluation (Signed)
Anesthesia Evaluation  Patient identified by MRN, date of birth, ID band Patient awake    Reviewed: Allergy & Precautions, NPO status , Patient's Chart, lab work & pertinent test results  History of Anesthesia Complications Negative for: history of anesthetic complications  Airway Mallampati: III  TM Distance: >3 FB Neck ROM: Full    Dental  (+) Teeth Intact, Dental Advisory Given   Pulmonary neg shortness of breath, neg sleep apnea, neg COPD, neg recent URI   breath sounds clear to auscultation       Cardiovascular hypertension, Pt. on medications and Pt. on home beta blockers (-) angina (-) Past MI and (-) CHF  Rhythm:Regular  03/2022 Stress ECHO: normal LVF, no wall motion abnormalities at rest or stress, Stress EF > 70%   Neuro/Psych    GI/Hepatic negative GI ROS,,,Acute Cholecystitis   Endo/Other  diabetes  ozempic  Renal/GU ESRF and DialysisRenal diseaseLab Results      Component                Value               Date                      CREATININE               14.96 (H)           10/25/2022           Lab Results      Component                Value               Date                      K                        4.5                 10/25/2022            Last hd 2/4     Musculoskeletal   Abdominal   Peds  Hematology  (+) Blood dyscrasia (Hb 9.8), anemia   Anesthesia Other Findings   Reproductive/Obstetrics                             Anesthesia Physical Anesthesia Plan  ASA: 3  Anesthesia Plan: General   Post-op Pain Management: Tylenol PO (pre-op)*   Induction: Intravenous and Rapid sequence  PONV Risk Score and Plan: 2 and Ondansetron and Dexamethasone  Airway Management Planned: Oral ETT  Additional Equipment: None  Intra-op Plan:   Post-operative Plan: Extubation in OR  Informed Consent: I have reviewed the patients History and Physical, chart, labs and  discussed the procedure including the risks, benefits and alternatives for the proposed anesthesia with the patient or authorized representative who has indicated his/her understanding and acceptance.     Dental advisory given  Plan Discussed with: CRNA  Anesthesia Plan Comments:        Anesthesia Quick Evaluation

## 2022-10-24 NOTE — Progress Notes (Signed)
PCP - Dr. Moreen Fowler  Cardiologist - Denies  EP- Denies  Endocrine- Denies  Neph- Dr. Lisbeth Ply- Denies  Chest x-ray - 04/01/22 (CE)  EKG - 10/25/22- Day of surgery  Stress Test - 04/19/22 (CE)  ECHO - 04/19/22 (CE)  Cardiac Cath - Denies  AICD-na PM-na LOOP-na  Nerve Stimulator- Denies  Dialysis- Yes- 2 days on, 1 day off. Pt states he will have a home session tonight 10/24/22  Sleep Study - Denies CPAP - Denies  LABS- 10/25/22: I-Stat 8  ASA- LD- 2/3  ERAS- Yes- clears until 0430  HA1C- 10/22/22(E): 7.0- Pt states he does not have to check his levels at home. OZEMPIC- LD- 2 weeks ago  Anesthesia- No  Pt denies having chest pain, sob, or fever during the pre-op phone call. All instructions explained to the pt, with a verbal understanding of the material. Pt also instructed to wear a mask and social distance if he goes out. The opportunity to ask questions was provided.

## 2022-10-24 NOTE — Progress Notes (Signed)
S.D.W- Instructions   Your procedure is scheduled on Mon., Feb. 5, 2024 from 8:00AM-9:31AM.  Report to [facility] Main Entrance "A" at 5:30 A.M., then check in with the Admitting office.  Call this number if you have problems the morning of surgery:  [phone]             If you experience any cold or flu symptoms such as cough, fever, chills, shortness of breath, etc. between now and your scheduled surgery, please notify us at the above         number.  Masks are now required throughout our facilities due to the increasing cases of Covid, Flu, and RSV infections.   Remember:  Do not eat after midnight on Feb. 4th  You may drink clear liquids until 3 hours (4:30AM) prior to surgery time, the morning of your surgery.   Clear liquids allowed are: Water, Non-Citrus Juices (without pulp), Carbonated Beverages, Clear Tea, Black Coffee ONLY (NO MILK, CREAM OR POWDERED CREAMER of any kind), and Gatorade    Take these medicines the morning of surgery with A SIP OF WATER:  AmLODipine (NORVASC)  Rosuvastatin (CRESTOR)   If Needed: Carvedilol (COREG)   As of today, STOP taking any Aspirin (unless otherwise instructed by your surgeon) Aleve, Naproxen, Ibuprofen, Motrin, Advil, Goody's, BC's, all herbal medications, fish oil, and all vitamins.  WHAT DO I DO ABOUT MY DIABETES MEDICATION?  Hold Semaglutide [place]) for 7 days prior to your surgery date.  Reviewed and Endorsed by [facility] Patient Education Committee, August 2015          Do not wear jewelry. Do not wear lotions, powders, cologne or deodorant. Do not shave 48 hours prior to surgery.  Men may shave face and neck. Do not bring valuables to the hospital.  [facility] is not responsible for any belongings or valuables.    Do NOT Smoke (Tobacco/Vaping)  24 hours prior to your procedure  If you use a CPAP at night, you may bring your mask for your overnight stay.   Contacts, glasses, hearing aids, dentures or partials may  not be worn into surgery, please bring cases for these belongings   For patients admitted to the hospital, discharge time will be determined by your treatment team.   Patients discharged the day of surgery will not be allowed to drive home, and someone needs to stay with them for 24 hours.  Special instructions:    Oral Hygiene is also important to reduce your risk of infection.  Remember - BRUSH YOUR TEETH THE MORNING OF SURGERY WITH YOUR REGULAR TOOTHPASTE  [facility]- Preparing For Surgery  Before surgery, you can play an important role. Because skin is not sterile, your skin needs to be as free of germs as possible. You can reduce the number of germs on your skin by washing with Antibacterial Soap before surgery.     Please follow these instructions carefully.     Shower the NIGHT BEFORE SURGERY and the MORNING OF SURGERY with Antibacterial Soap.   Pat yourself dry with a CLEAN TOWEL.  Wear CLEAN PAJAMAS to bed the night before surgery  Place CLEAN SHEETS on your bed the night before your surgery  DO NOT SLEEP WITH PETS.  Day of Surgery:  Take a shower with Antibacterial soap. Wear Clean/Comfortable clothing the morning of surgery Do not apply any deodorants/lotions.   Remember to brush your teeth WITH YOUR REGULAR TOOTHPASTE.   If you test positive for Covid, or  been in contact with anyone that has tested positive in the last 10 days, please notify your surgeon.  SURGICAL WAITING ROOM VISITATION Patients having surgery or a procedure may have no more than 2 support people in the waiting area - these visitors may rotate.   Children under the age of 47 must have an adult with them who is not the patient. If the patient needs to stay at the hospital during part of their recovery, the visitor guidelines for inpatient rooms apply. Pre-op nurse will coordinate an appropriate time for 1 support person to accompany patient in pre-op.  This support person may not rotate.    Please refer to the [facility] website for the visitor guidelines for Inpatients (after your surgery is over and you are in a regular room).

## 2022-10-25 ENCOUNTER — Inpatient Hospital Stay (HOSPITAL_BASED_OUTPATIENT_CLINIC_OR_DEPARTMENT_OTHER): Payer: Medicare Other | Admitting: Anesthesiology

## 2022-10-25 ENCOUNTER — Observation Stay (HOSPITAL_COMMUNITY)
Admission: AD | Admit: 2022-10-25 | Discharge: 2022-10-26 | Disposition: A | Discharge status: Home / Self Care | Payer: Medicare Other | Source: Ambulatory Visit | Attending: Surgery | Admitting: Surgery

## 2022-10-25 ENCOUNTER — Encounter (HOSPITAL_COMMUNITY): Admission: AD | Disposition: A | Discharge status: Home / Self Care | Payer: Self-pay | Source: Ambulatory Visit

## 2022-10-25 ENCOUNTER — Other Ambulatory Visit: Payer: Self-pay

## 2022-10-25 ENCOUNTER — Encounter (HOSPITAL_COMMUNITY): Payer: Self-pay | Admitting: Surgery

## 2022-10-25 ENCOUNTER — Inpatient Hospital Stay (HOSPITAL_COMMUNITY): Payer: Medicare Other

## 2022-10-25 ENCOUNTER — Inpatient Hospital Stay (HOSPITAL_COMMUNITY): Payer: Medicare Other | Admitting: Anesthesiology

## 2022-10-25 DIAGNOSIS — E1122 Type 2 diabetes mellitus with diabetic chronic kidney disease: Secondary | ICD-10-CM | POA: Insufficient documentation

## 2022-10-25 DIAGNOSIS — N186 End stage renal disease: Secondary | ICD-10-CM

## 2022-10-25 DIAGNOSIS — I120 Hypertensive chronic kidney disease with stage 5 chronic kidney disease or end stage renal disease: Secondary | ICD-10-CM | POA: Insufficient documentation

## 2022-10-25 DIAGNOSIS — D631 Anemia in chronic kidney disease: Secondary | ICD-10-CM

## 2022-10-25 DIAGNOSIS — K8010 Calculus of gallbladder with chronic cholecystitis without obstruction: Secondary | ICD-10-CM | POA: Diagnosis not present

## 2022-10-25 DIAGNOSIS — K8012 Calculus of gallbladder with acute and chronic cholecystitis without obstruction: Secondary | ICD-10-CM | POA: Diagnosis not present

## 2022-10-25 DIAGNOSIS — Z992 Dependence on renal dialysis: Secondary | ICD-10-CM | POA: Diagnosis not present

## 2022-10-25 DIAGNOSIS — Z9049 Acquired absence of other specified parts of digestive tract: Principal | ICD-10-CM

## 2022-10-25 HISTORY — PX: CHOLECYSTECTOMY: SHX55

## 2022-10-25 LAB — GLUCOSE, CAPILLARY
Glucose-Capillary: 137 mg/dL — ABNORMAL HIGH (ref 70–99)
Glucose-Capillary: 125 mg/dL — ABNORMAL HIGH (ref 70–99)
Glucose-Capillary: 114 mg/dL — ABNORMAL HIGH (ref 70–99)
Glucose-Capillary: 137 mg/dL — ABNORMAL HIGH (ref 70–99)

## 2022-10-25 LAB — COMPREHENSIVE METABOLIC PANEL
ALT: 466 U/L — ABNORMAL HIGH (ref 0–44)
AST: 206 U/L — ABNORMAL HIGH (ref 15–41)
Albumin: 3.8 g/dL (ref 3.5–5.0)
Alkaline Phosphatase: 136 U/L — ABNORMAL HIGH (ref 38–126)
Anion gap: 18 — ABNORMAL HIGH (ref 5–15)
BUN: 75 mg/dL — ABNORMAL HIGH (ref 6–20)
CO2: 26 mmol/L (ref 22–32)
Calcium: 9.3 mg/dL (ref 8.9–10.3)
Chloride: 88 mmol/L — ABNORMAL LOW (ref 98–111)
Creatinine, Ser: 14.96 mg/dL — ABNORMAL HIGH (ref 0.61–1.24)
GFR, Estimated: 4 mL/min — ABNORMAL LOW (ref >60)
Glucose, Bld: 123 mg/dL — ABNORMAL HIGH (ref 70–99)
Potassium: 4.5 mmol/L (ref 3.5–5.1)
Sodium: 132 mmol/L — ABNORMAL LOW (ref 135–145)
Total Bilirubin: 2.6 mg/dL — ABNORMAL HIGH (ref 0.3–1.2)
Total Protein: 8 g/dL (ref 6.5–8.1)

## 2022-10-25 LAB — POCT I-STAT, CHEM 8
BUN: 68 mg/dL — ABNORMAL HIGH (ref 6–20)
Calcium, Ion: 1.06 mmol/L — ABNORMAL LOW (ref 1.15–1.40)
Chloride: 93 mmol/L — ABNORMAL LOW (ref 98–111)
Creatinine, Ser: 16.6 mg/dL — ABNORMAL HIGH (ref 0.61–1.24)
Glucose, Bld: 125 mg/dL — ABNORMAL HIGH (ref 70–99)
HCT: 35 % — ABNORMAL LOW (ref 39.0–52.0)
Hemoglobin: 11.9 g/dL — ABNORMAL LOW (ref 13.0–17.0)
Potassium: 4.1 mmol/L (ref 3.5–5.1)
Sodium: 135 mmol/L (ref 135–145)
TCO2: 28 mmol/L (ref 22–32)

## 2022-10-25 SURGERY — LAPAROSCOPIC CHOLECYSTECTOMY WITH INTRAOPERATIVE CHOLANGIOGRAM
Anesthesia: General | Site: Abdomen

## 2022-10-25 MED ORDER — 0.9 % SODIUM CHLORIDE (POUR BTL) OPTIME
TOPICAL | Status: DC | PRN
  Administered 2022-10-25: 1000 mL

## 2022-10-25 MED ORDER — BUPIVACAINE HCL (PF) 0.25 % IJ SOLN
INTRAMUSCULAR | Status: AC
  Filled 2022-10-25: qty 30

## 2022-10-25 MED ORDER — DOCUSATE SODIUM 100 MG PO CAPS
100.0000 mg | ORAL_CAPSULE | Freq: Two times a day (BID) | ORAL | Status: DC
  Administered 2022-10-25 – 2022-10-26 (×2): 100 mg via ORAL
  Filled 2022-10-25 (×2): qty 1

## 2022-10-25 MED ORDER — GLYCOPYRROLATE 0.2 MG/ML IJ SOLN
INTRAMUSCULAR | Status: DC | PRN
  Administered 2022-10-25: .2 mg via INTRAVENOUS

## 2022-10-25 MED ORDER — OXYCODONE HCL 5 MG PO TABS: 5.0000 mg | ORAL_TABLET | Freq: Once | ORAL | Status: DC | PRN

## 2022-10-25 MED ORDER — OXYCODONE HCL 5 MG/5ML PO SOLN: 5.0000 mg | Freq: Once | ORAL | Status: DC | PRN

## 2022-10-25 MED ORDER — ATROPINE SULFATE 0.4 MG/ML IV SOLN
INTRAVENOUS | Status: AC
  Filled 2022-10-25: qty 1

## 2022-10-25 MED ORDER — CEFAZOLIN SODIUM-DEXTROSE 2-4 GM/100ML-% IV SOLN
2.0000 g | INTRAVENOUS | Status: AC
  Administered 2022-10-25: 2 g via INTRAVENOUS
  Filled 2022-10-25: qty 100

## 2022-10-25 MED ORDER — BUPIVACAINE-EPINEPHRINE (PF) 0.5% -1:200000 IJ SOLN
INTRAMUSCULAR | Status: AC
  Filled 2022-10-25: qty 30

## 2022-10-25 MED ORDER — NEOSTIGMINE METHYLSULFATE 10 MG/10ML IV SOLN
INTRAVENOUS | Status: DC | PRN
  Administered 2022-10-25: 3 mg via INTRAVENOUS

## 2022-10-25 MED ORDER — ACETAMINOPHEN 10 MG/ML IV SOLN
INTRAVENOUS | Status: AC
  Filled 2022-10-25: qty 100

## 2022-10-25 MED ORDER — CISATRACURIUM BESYLATE (PF) 10 MG/5ML IV SOLN
INTRAVENOUS | Status: DC | PRN
  Administered 2022-10-25: 2 mg via INTRAVENOUS
  Administered 2022-10-25: 1 mg via INTRAVENOUS
  Administered 2022-10-25: 2 mg via INTRAVENOUS

## 2022-10-25 MED ORDER — DEXAMETHASONE SODIUM PHOSPHATE 10 MG/ML IJ SOLN
INTRAMUSCULAR | Status: AC
  Filled 2022-10-25: qty 1

## 2022-10-25 MED ORDER — METHOCARBAMOL 500 MG PO TABS
1000.0000 mg | ORAL_TABLET | Freq: Three times a day (TID) | ORAL | Status: DC
  Administered 2022-10-25 – 2022-10-26 (×2): 1000 mg via ORAL
  Filled 2022-10-25 (×2): qty 2

## 2022-10-25 MED ORDER — OXYCODONE HCL 5 MG PO TABS
5.0000 mg | ORAL_TABLET | ORAL | Status: DC | PRN
  Administered 2022-10-25: 5 mg via ORAL
  Administered 2022-10-26: 10 mg via ORAL
  Filled 2022-10-25: qty 1
  Filled 2022-10-25: qty 2

## 2022-10-25 MED ORDER — DEXAMETHASONE SODIUM PHOSPHATE 10 MG/ML IJ SOLN
INTRAMUSCULAR | Status: DC | PRN
  Administered 2022-10-25: 4 mg via INTRAVENOUS

## 2022-10-25 MED ORDER — EPHEDRINE SULFATE-NACL 50-0.9 MG/10ML-% IV SOSY
PREFILLED_SYRINGE | INTRAVENOUS | Status: DC | PRN
  Administered 2022-10-25: 15 mg via INTRAVENOUS
  Administered 2022-10-25: 5 mg via INTRAVENOUS

## 2022-10-25 MED ORDER — ONDANSETRON 4 MG PO TBDP
4.0000 mg | ORAL_TABLET | Freq: Four times a day (QID) | ORAL | Status: DC | PRN
  Administered 2022-10-25: 4 mg via ORAL
  Filled 2022-10-25: qty 1

## 2022-10-25 MED ORDER — CHLORHEXIDINE GLUCONATE CLOTH 2 % EX PADS: 6.0000 | MEDICATED_PAD | Freq: Once | CUTANEOUS | Status: DC

## 2022-10-25 MED ORDER — CHLORHEXIDINE GLUCONATE 0.12 % MT SOLN
15.0000 mL | Freq: Once | OROMUCOSAL | Status: AC
  Administered 2022-10-25: 15 mL via OROMUCOSAL
  Filled 2022-10-25: qty 15

## 2022-10-25 MED ORDER — HEPARIN SODIUM (PORCINE) 5000 UNIT/ML IJ SOLN
5000.0000 [IU] | Freq: Once | INTRAMUSCULAR | Status: AC
  Administered 2022-10-25: 5000 [IU] via SUBCUTANEOUS
  Filled 2022-10-25: qty 1

## 2022-10-25 MED ORDER — PROPOFOL 10 MG/ML IV BOLUS
INTRAVENOUS | Status: DC | PRN
  Administered 2022-10-25: 150 mg via INTRAVENOUS

## 2022-10-25 MED ORDER — ORAL CARE MOUTH RINSE: 15.0000 mL | Freq: Once | OROMUCOSAL | Status: AC

## 2022-10-25 MED ORDER — FENTANYL CITRATE (PF) 100 MCG/2ML IJ SOLN
INTRAMUSCULAR | Status: AC
  Filled 2022-10-25: qty 2

## 2022-10-25 MED ORDER — FENTANYL CITRATE (PF) 250 MCG/5ML IJ SOLN
INTRAMUSCULAR | Status: AC
  Filled 2022-10-25: qty 5

## 2022-10-25 MED ORDER — BUPIVACAINE LIPOSOME 1.3 % IJ SUSP
INTRAMUSCULAR | Status: DC | PRN
  Administered 2022-10-25: 30 mL via SURGICAL_CAVITY

## 2022-10-25 MED ORDER — MIDAZOLAM HCL 2 MG/2ML IJ SOLN
INTRAMUSCULAR | Status: DC | PRN
  Administered 2022-10-25: 2 mg via INTRAVENOUS

## 2022-10-25 MED ORDER — FENTANYL CITRATE (PF) 100 MCG/2ML IJ SOLN
25.0000 ug | INTRAMUSCULAR | Status: DC | PRN
  Administered 2022-10-25 (×4): 25 ug via INTRAVENOUS

## 2022-10-25 MED ORDER — ACETAMINOPHEN 500 MG PO TABS
1000.0000 mg | ORAL_TABLET | Freq: Four times a day (QID) | ORAL | Status: DC
  Administered 2022-10-25 – 2022-10-26 (×2): 1000 mg via ORAL
  Filled 2022-10-25 (×2): qty 2

## 2022-10-25 MED ORDER — ACETAMINOPHEN 500 MG PO TABS
1000.0000 mg | ORAL_TABLET | Freq: Once | ORAL | Status: AC
  Administered 2022-10-25: 1000 mg via ORAL
  Filled 2022-10-25: qty 2

## 2022-10-25 MED ORDER — PHENYLEPHRINE HCL-NACL 20-0.9 MG/250ML-% IV SOLN
INTRAVENOUS | Status: DC | PRN
  Administered 2022-10-25: 50 ug/min via INTRAVENOUS

## 2022-10-25 MED ORDER — BUPIVACAINE LIPOSOME 1.3 % IJ SUSP
INTRAMUSCULAR | Status: AC
  Filled 2022-10-25: qty 20

## 2022-10-25 MED ORDER — CEFAZOLIN SODIUM-DEXTROSE 2-4 GM/100ML-% IV SOLN: 2.0000 g | INTRAVENOUS | Status: DC

## 2022-10-25 MED ORDER — MIDAZOLAM HCL 2 MG/2ML IJ SOLN
INTRAMUSCULAR | Status: AC
  Filled 2022-10-25: qty 2

## 2022-10-25 MED ORDER — PHENYLEPHRINE 80 MCG/ML (10ML) SYRINGE FOR IV PUSH (FOR BLOOD PRESSURE SUPPORT)
PREFILLED_SYRINGE | INTRAVENOUS | Status: DC | PRN
  Administered 2022-10-25 (×4): 160 ug via INTRAVENOUS
  Administered 2022-10-25: 80 ug via INTRAVENOUS

## 2022-10-25 MED ORDER — ONDANSETRON HCL 4 MG/2ML IJ SOLN: 4.0000 mg | Freq: Four times a day (QID) | INTRAMUSCULAR | Status: DC | PRN

## 2022-10-25 MED ORDER — SUCCINYLCHOLINE CHLORIDE 200 MG/10ML IV SOSY
PREFILLED_SYRINGE | INTRAVENOUS | Status: AC
  Filled 2022-10-25: qty 10

## 2022-10-25 MED ORDER — SUCCINYLCHOLINE CHLORIDE 200 MG/10ML IV SOSY
PREFILLED_SYRINGE | INTRAVENOUS | Status: DC | PRN
  Administered 2022-10-25: 120 mg via INTRAVENOUS

## 2022-10-25 MED ORDER — ONDANSETRON HCL 4 MG/2ML IJ SOLN
INTRAMUSCULAR | Status: DC | PRN
  Administered 2022-10-25: 4 mg via INTRAVENOUS

## 2022-10-25 MED ORDER — LIDOCAINE 2% (20 MG/ML) 5 ML SYRINGE
INTRAMUSCULAR | Status: DC | PRN
  Administered 2022-10-25: 80 mg via INTRAVENOUS

## 2022-10-25 MED ORDER — HEPARIN SODIUM (PORCINE) 5000 UNIT/ML IJ SOLN
5000.0000 [IU] | Freq: Three times a day (TID) | INTRAMUSCULAR | Status: DC
  Filled 2022-10-25: qty 1

## 2022-10-25 MED ORDER — PHENYLEPHRINE 80 MCG/ML (10ML) SYRINGE FOR IV PUSH (FOR BLOOD PRESSURE SUPPORT)
PREFILLED_SYRINGE | INTRAVENOUS | Status: AC
  Filled 2022-10-25: qty 10

## 2022-10-25 MED ORDER — SODIUM CHLORIDE 0.9 % IV SOLN: INTRAVENOUS | Status: DC

## 2022-10-25 MED ORDER — INSULIN ASPART 100 UNIT/ML IJ SOLN: 0.0000 [IU] | INTRAMUSCULAR | Status: DC | PRN

## 2022-10-25 MED ORDER — ONDANSETRON HCL 4 MG/2ML IJ SOLN
INTRAMUSCULAR | Status: AC
  Filled 2022-10-25: qty 2

## 2022-10-25 MED ORDER — ACETAMINOPHEN 160 MG/5ML PO SOLN: 1000.0000 mg | Freq: Once | ORAL | Status: DC | PRN

## 2022-10-25 MED ORDER — BUPIVACAINE LIPOSOME 1.3 % IJ SUSP: 20.0000 mL | Freq: Once | INTRAMUSCULAR | Status: DC

## 2022-10-25 MED ORDER — ACETAMINOPHEN 500 MG PO TABS: 1000.0000 mg | ORAL_TABLET | Freq: Once | ORAL | Status: DC | PRN

## 2022-10-25 MED ORDER — LIDOCAINE 2% (20 MG/ML) 5 ML SYRINGE
INTRAMUSCULAR | Status: AC
  Filled 2022-10-25: qty 5

## 2022-10-25 MED ORDER — ACETAMINOPHEN 10 MG/ML IV SOLN
1000.0000 mg | Freq: Once | INTRAVENOUS | Status: DC | PRN
  Administered 2022-10-25: 1000 mg via INTRAVENOUS

## 2022-10-25 MED ORDER — FENTANYL CITRATE (PF) 250 MCG/5ML IJ SOLN
INTRAMUSCULAR | Status: DC | PRN
  Administered 2022-10-25: 100 ug via INTRAVENOUS

## 2022-10-25 SURGICAL SUPPLY — 44 items
ADH SKN CLS APL DERMABOND .7 (GAUZE/BANDAGES/DRESSINGS) ×1
APL PRP STRL LF DISP 70% ISPRP (MISCELLANEOUS) ×1
APPLIER CLIP 5 13 M/L LIGAMAX5 (MISCELLANEOUS) ×1
APR CLP MED LRG 5 ANG JAW (MISCELLANEOUS) ×1
BAG SPEC RTRVL 10 TROC 200 (ENDOMECHANICALS) ×1
CANISTER SUCT 3000ML PPV (MISCELLANEOUS) ×2 IMPLANT
CHLORAPREP W/TINT 26 (MISCELLANEOUS) ×2 IMPLANT
CLIP APPLIE 5 13 M/L LIGAMAX5 (MISCELLANEOUS) ×2 IMPLANT
COVER MAYO STAND STRL (DRAPES) IMPLANT
COVER SURGICAL LIGHT HANDLE (MISCELLANEOUS) ×2 IMPLANT
DERMABOND ADVANCED .7 DNX12 (GAUZE/BANDAGES/DRESSINGS) ×2 IMPLANT
DISSECTOR BLUNT TIP ENDO 5MM (MISCELLANEOUS) ×2 IMPLANT
DRAPE C-ARM 42X120 X-RAY (DRAPES) IMPLANT
ELECT REM PT RETURN 9FT ADLT (ELECTROSURGICAL) ×1
ELECTRODE REM PT RTRN 9FT ADLT (ELECTROSURGICAL) ×2 IMPLANT
ENDOLOOP SUT PDS II  0 18 (SUTURE) ×2
ENDOLOOP SUT PDS II 0 18 (SUTURE) IMPLANT
GLOVE BIO SURGEON STRL SZ 6.5 (GLOVE) ×2 IMPLANT
GLOVE BIOGEL PI IND STRL 6 (GLOVE) ×2 IMPLANT
GOWN STRL REUS W/ TWL LRG LVL3 (GOWN DISPOSABLE) ×6 IMPLANT
GOWN STRL REUS W/TWL LRG LVL3 (GOWN DISPOSABLE) ×3
KIT BASIN OR (CUSTOM PROCEDURE TRAY) ×2 IMPLANT
KIT TURNOVER KIT B (KITS) ×2 IMPLANT
NS IRRIG 1000ML POUR BTL (IV SOLUTION) ×2 IMPLANT
PAD ARMBOARD 7.5X6 YLW CONV (MISCELLANEOUS) ×2 IMPLANT
POUCH RETRIEVAL ECOSAC 10 (ENDOMECHANICALS) IMPLANT
POUCH RETRIEVAL ECOSAC 10MM (ENDOMECHANICALS) ×1
SCISSORS LAP 5X35 DISP (ENDOMECHANICALS) ×2 IMPLANT
SET CHOLANGIOGRAPH 5 50 .035 (SET/KITS/TRAYS/PACK) IMPLANT
SET IRRIG TUBING LAPAROSCOPIC (IRRIGATION / IRRIGATOR) IMPLANT
SET TUBE SMOKE EVAC HIGH FLOW (TUBING) ×2 IMPLANT
SLEEVE Z-THREAD 5X100MM (TROCAR) ×4 IMPLANT
SPECIMEN JAR SMALL (MISCELLANEOUS) ×2 IMPLANT
SUT MNCRL AB 4-0 PS2 18 (SUTURE) ×2 IMPLANT
SUT VICRYL 0 UR6 27IN ABS (SUTURE) IMPLANT
SYS BAG RETRIEVAL 10MM (BASKET)
SYSTEM BAG RETRIEVAL 10MM (BASKET) IMPLANT
TOWEL GREEN STERILE (TOWEL DISPOSABLE) ×2 IMPLANT
TOWEL GREEN STERILE FF (TOWEL DISPOSABLE) ×2 IMPLANT
TRAY LAPAROSCOPIC MC (CUSTOM PROCEDURE TRAY) ×2 IMPLANT
TROCAR BALLN 12MMX100 BLUNT (TROCAR) ×2 IMPLANT
TROCAR Z-THREAD OPTICAL 5X100M (TROCAR) ×2 IMPLANT
WARMER LAPAROSCOPE (MISCELLANEOUS) ×2 IMPLANT
WATER STERILE IRR 1000ML POUR (IV SOLUTION) ×2 IMPLANT

## 2022-10-25 NOTE — H&P (Signed)
Ray Bradley is an 52 y.o. male.   HPI: 40M with h/o ESRD on home HD (two days on and one day off), most recent complete session 2/4. Recent admission for abdominal pain, n/v and found to have transaminitis and hyperbilirubinemia. Normal CBD on [place] and normal MRCP. Patient left AMA that admission, but presents today for lap chole with IOC. No prior abdominal surgery.    Past Medical History:  Diagnosis Date   Chest pain    Chronic kidney disease    HTN (hypertension)    Hyperlipidemia    Type II or unspecified type diabetes mellitus without mention of complication, not stated as uncontrolled    Wears glasses     Past Surgical History:  Procedure Laterality Date   AV FISTULA PLACEMENT Left 08/24/2019   Procedure: CREATION OF ARTERIOVENOUS (AV) FISTULA  LEFT ARM;  Surgeon: [name], MD;  Location: [facility] OR;  Service: Vascular;  Laterality: Left;   CIRCUMCISION, NON-NEWBORN  2004   COLONOSCOPY W/ BIOPSIES AND POLYPECTOMY     EYE SURGERY     FOOT SURGERY     IR FLUORO GUIDE CV LINE RIGHT  07/23/2019   IR FLUORO GUIDE CV LINE RIGHT  09/19/2019   IR US GUIDE VASC ACCESS RIGHT  07/23/2019   MULTIPLE TOOTH EXTRACTIONS      Family History  Problem Relation Age of Onset   Colon cancer Father    Diabetes Other        family history   Hypertension Other        family history   Aneurysm Mother        died age 62, brain   Hypertension Mother    Hypertension Sister 16   Hypertension Sister 63   Hypertension Brother 31    Social History:  reports that he has never smoked. He has never used smokeless tobacco. He reports that he does not currently use alcohol. He reports that he does not use drugs.  Allergies: No Known Allergies  Medications: I have reviewed the patient's current medications.  Results for orders placed or performed during the hospital encounter of 10/25/22 (from the past 48 hour(s))  Glucose, capillary     Status: Abnormal   Collection Time: 10/25/22   5:53 AM  Result Value Ref Range   Glucose-Capillary 137 (H) 70 - 99 mg/dL    Comment: Glucose reference range applies only to samples taken after fasting for at least 8 hours.  I-STAT, chem 8     Status: Abnormal   Collection Time: 10/25/22  6:10 AM  Result Value Ref Range   Sodium 135 135 - 145 mmol/L   Potassium 4.1 3.5 - 5.1 mmol/L   Chloride 93 (L) 98 - 111 mmol/L   BUN 68 (H) 6 - 20 mg/dL   Creatinine, Ser 16.60 (H) 0.61 - 1.24 mg/dL   Glucose, Bld 125 (H) 70 - 99 mg/dL    Comment: Glucose reference range applies only to samples taken after fasting for at least 8 hours.   Calcium, Ion 1.06 (L) 1.15 - 1.40 mmol/L   TCO2 28 22 - 32 mmol/L   Hemoglobin 11.9 (L) 13.0 - 17.0 g/dL   HCT 35.0 (L) 39.0 - 52.0 %    No results found.  ROS 10 point review of systems is negative except as listed above in HPI.   Physical Exam Blood pressure 104/68, pulse 75, temperature 98.3 F (36.8 C), resp. rate 18, height 5\' 10"  (1.778 m),  weight 117.9 kg, SpO2 99 %. Constitutional: well-developed, well-nourished HEENT: pupils equal, round, reactive to light, 11mm b/l, moist conjunctiva, external inspection of ears and nose normal, hearing intact Oropharynx: normal oropharyngeal mucosa, normal dentition Neck: no thyromegaly, trachea midline, no midline cervical tenderness to palpation Chest: breath sounds equal bilaterally, normal respiratory effort, no midline or lateral chest wall tenderness to palpation/deformity Abdomen: soft, NT, no bruising, no hepatosplenomegaly Back: no wounds, no thoracic/lumbar spine tenderness to palpation, no thoracic/lumbar spine stepoffs Rectal: deferred Extremities: 2+ radial and pedal pulses bilaterally, intact motor and sensation bilateral UE and LE, no peripheral edema, L FA AVF (2020) MSK: normal gait/station, no clubbing/cyanosis of fingers/toes, normal ROM of all four extremities Skin: warm, dry, no rashes Psych: normal memory, normal mood/affect      Assessment/Plan: 54M with ESRD, gallstones, and suspected CBD obstruction. Plan for lap chole with IOC today. Will obtain pre-op CMP. Informed consent was obtained after detailed explanation of risks, including bleeding, infection, biloma, hematoma, injury to common bile duct, need for conversion to open procedure, and need for further procedures, including ERCP. All questions answered to the patient's satisfaction.   Jesusita Oka, MD General and [facility]

## 2022-10-25 NOTE — Anesthesia Postprocedure Evaluation (Signed)
Anesthesia Post Note  Patient: Ray Bradley  Procedure(s) Performed: LAPAROSCOPIC CHOLECYSTECTOMY (Abdomen)     Patient location during evaluation: PACU Anesthesia Type: General Level of consciousness: awake and alert Pain management: pain level controlled Vital Signs Assessment: post-procedure vital signs reviewed and stable Respiratory status: spontaneous breathing, nonlabored ventilation and respiratory function stable Cardiovascular status: blood pressure returned to baseline and stable Postop Assessment: no apparent nausea or vomiting Anesthetic complications: no   No notable events documented.  Last Vitals:  Vitals:   10/25/22 1803 10/25/22 1942  BP: (!) 164/73 (!) 144/74  Pulse: 65 75  Resp: 16 17  Temp: 36.7 C 36.5 C  SpO2: 97% 97%    Last Pain:  Vitals:   10/25/22 1942  TempSrc: Oral  PainSc:                  Chrystel Barefield

## 2022-10-25 NOTE — Op Note (Signed)
   Operative Note  Date: 10/25/2022  Procedure: laparoscopic cholecystectomy  Pre-op diagnosis:  gallstones, hyperbilirubinemia Post-op diagnosis: chronic calculous cholecystitis  Indication and clinical history: The patient is a 52 y.o. year old male with gallstones and hyperbilirubinemia.  Surgeon: Jesusita Oka, MD  Anesthesiologist: Ermalene Postin, MD Anesthesia: General  Findings: mildly nodular liver Specimen: gallbladder EBL: 10cc Drains/Implants: none  Disposition: PACU - hemodynamically stable.  Description of procedure: The patient was positioned supine on the operating room table. Time-out was performed verifying correct patient, procedure, signature of informed consent, and administration of pre-operative antibiotics, VTE prophylaxis with heparin. General anesthetic induction and intubation were uneventful. The abdomen was prepped and draped in the usual sterile fashion. An infra-umbilical incision was made using an open technique using zero vicryl stay sutures on either side of the fascia and a 71mm Hassan port inserted. After establishing pneumoperitoneum, which the patient tolerated well, the abdominal cavity was inspected and no injury of any intra-abdominal structures was identified. Additional ports were placed under direct visualization and using local anesthetic: two 29mm ports in the right subcostal region and a 27mm port in the epigastric region. The patient was re-positioned to reverse Trendelenburg and right side up. Adhesiolysis was performed to expose the gallbladder, which was then retracted cephalad. The infundibulum was identified and retracted toward the right lower quadrant. The peritoneum was incised over the infundibulum and the triangle of Calot dissected. The cystic artery was identified as a single structure entering directing into the gallbladder and was doubly clipped and ligated. The cystic duct was unable to be clearly differentiated from the infundibulum of the  gallbladder due to dense adhesions, so the decision was made to transect the gallbladder at the level of the distal infundibulum. The gallbladder was then dissected off the liver bed and the distal infundibulum encircled with an endoloop and transected. There was an injury to the liver at the falciform that was not bleeding, but was cauterized to prevent future bleeding. The gallbladder was placed in an endoscopic specimen retrieval bag, removed via the umbilical port site, and sent to pathology as a permanent specimen. The gallbladder fossa was inspected confirming hemostasis and the absence of bile leakage from the cystic duct stump. The abdomen was desufflated and the fascia of the umbilical port site was closed using the previously placed stay sutures. Additional local anesthetic was administered at the umbilical port site.  The skin of all incisions was closed with 4-0 monocryl. Sterile dressings were applied. All sponge and instrument counts were correct at the conclusion of the procedure. The patient was awakened from anesthesia, extubated uneventfully, and transported to the PACU - hemodynamically stable. There were no complications.    Jesusita Oka, MD General and [facility]

## 2022-10-25 NOTE — Care Plan (Signed)
Pt arrived to room, A&O, accompanied by PACU RN on bed.  He has 5 lap sites to abdomen that are CDI, closed with dermabond.  He denies pain at present,however, does has "gas pains".  He had large BM in PACU.  Wife is at bedside.  NADN, no additional needs are present. Sreece, RN

## 2022-10-25 NOTE — Anesthesia Procedure Notes (Signed)
Procedure Name: Intubation Date/Time: 10/25/2022 11:24 AM  Performed by: Lorie Phenix, CRNAPre-anesthesia Checklist: Patient identified, Emergency Drugs available, Suction available and Patient being monitored Patient Re-evaluated:Patient Re-evaluated prior to induction Oxygen Delivery Method: Circle system utilized Preoxygenation: Pre-oxygenation with 100% oxygen Induction Type: IV induction Ventilation: Mask ventilation without difficulty Laryngoscope Size: Mac and 4 Grade View: Grade I Tube type: Oral Number of attempts: 1 Airway Equipment and Method: Stylet Placement Confirmation: ETT inserted through vocal cords under direct vision, positive ETCO2 and breath sounds checked- equal and bilateral Secured at: 24 cm Tube secured with: Tape Dental Injury: Teeth and Oropharynx as per pre-operative assessment

## 2022-10-25 NOTE — Transfer of Care (Signed)
Immediate Anesthesia Transfer of Care Note  Patient: Ray Bradley  Procedure(s) Performed: LAPAROSCOPIC CHOLECYSTECTOMY (Abdomen)  Patient Location: PACU  Anesthesia Type:General  Level of Consciousness: awake and alert   Airway & Oxygen Therapy: Patient Spontanous Breathing  Post-op Assessment: Report given to RN and Post -op Vital signs reviewed and stable  Post vital signs: Reviewed and stable  Last Vitals:  Vitals Value Taken Time  BP 125/70 10/25/22 1245  Temp    Pulse 75 10/25/22 1248  Resp 18 10/25/22 1248  SpO2 97 % 10/25/22 1248  Vitals shown include unvalidated device data.  Last Pain:  Vitals:   10/25/22 0607  PainSc: 0-No pain      Patients Stated Pain Goal: 0 (37/10/62 6948)  Complications: No notable events documented.

## 2022-10-26 ENCOUNTER — Encounter (HOSPITAL_COMMUNITY): Payer: Self-pay | Admitting: Surgery

## 2022-10-26 DIAGNOSIS — K8012 Calculus of gallbladder with acute and chronic cholecystitis without obstruction: Secondary | ICD-10-CM | POA: Diagnosis not present

## 2022-10-26 LAB — COMPREHENSIVE METABOLIC PANEL
ALT: 505 U/L — ABNORMAL HIGH (ref 0–44)
AST: 261 U/L — ABNORMAL HIGH (ref 15–41)
Albumin: 3.3 g/dL — ABNORMAL LOW (ref 3.5–5.0)
Alkaline Phosphatase: 123 U/L (ref 38–126)
Anion gap: 19 — ABNORMAL HIGH (ref 5–15)
BUN: 89 mg/dL — ABNORMAL HIGH (ref 6–20)
CO2: 23 mmol/L (ref 22–32)
Calcium: 8.8 mg/dL — ABNORMAL LOW (ref 8.9–10.3)
Chloride: 90 mmol/L — ABNORMAL LOW (ref 98–111)
Creatinine, Ser: 15.63 mg/dL — ABNORMAL HIGH (ref 0.61–1.24)
GFR, Estimated: 3 mL/min — ABNORMAL LOW (ref >60)
Glucose, Bld: 151 mg/dL — ABNORMAL HIGH (ref 70–99)
Potassium: 4.6 mmol/L (ref 3.5–5.1)
Sodium: 132 mmol/L — ABNORMAL LOW (ref 135–145)
Total Bilirubin: 1.9 mg/dL — ABNORMAL HIGH (ref 0.3–1.2)
Total Protein: 7.3 g/dL (ref 6.5–8.1)

## 2022-10-26 LAB — SURGICAL PATHOLOGY

## 2022-10-26 LAB — GLUCOSE, CAPILLARY: Glucose-Capillary: 168 mg/dL — ABNORMAL HIGH (ref 70–99)

## 2022-10-26 MED ORDER — PHENOL 1.4 % MT LIQD
1.0000 | OROMUCOSAL | Status: DC | PRN
  Filled 2022-10-26: qty 177

## 2022-10-26 MED ORDER — DOCUSATE SODIUM 100 MG PO CAPS: 100.0000 mg | ORAL_CAPSULE | Freq: Two times a day (BID) | ORAL | 2 refills | Status: DC

## 2022-10-26 MED ORDER — ACETAMINOPHEN 500 MG PO TABS: 1000.0000 mg | ORAL_TABLET | Freq: Four times a day (QID) | ORAL | 3 refills | Status: DC

## 2022-10-26 MED ORDER — OXYCODONE HCL 5 MG PO TABS: 5.0000 mg | ORAL_TABLET | ORAL | 0 refills | Status: DC | PRN

## 2022-10-26 MED ORDER — METHOCARBAMOL 750 MG PO TABS: 750.0000 mg | ORAL_TABLET | Freq: Four times a day (QID) | ORAL | 1 refills | Status: DC

## 2022-10-26 NOTE — Discharge Instructions (Signed)
[facility]  LAPAROSCOPIC SURGERY: POST OP INSTRUCTIONS Always review your discharge instruction sheet given to you by the facility where your surgery was performed. IF YOU HAVE DISABILITY OR FAMILY LEAVE FORMS, YOU MUST BRING THEM TO THE OFFICE FOR PROCESSING.   DO NOT GIVE THEM TO YOUR DOCTOR.  PAIN CONTROL  Pain regimen: take over-the-counter tylenol (acetaminophen) 1000mg  every six hours and the robaxin (methocarbamol) 750mg  every six hours. You should be taking something every three hours. Example: tylenol ( acetaminophen) at 9am, robaxin (methocarbamol) at 12pm, tylenol (acetaminophen) again at 3pm, robaxin (methocarbamol) at 6pm. You also have a prescription for oxycodone, which should be taken if the tylenol (acetaminophen) and robaxin (methocarbamol) are not enough to control your pain. You may take the oxycodone as frequently as every four hours as needed, but if you are taking the other medications as above, you should not need the oxycodone this frequently. You have also been given a prescription for colace (docusate) which is a stool softener. Please take this as prescribed because the oxycodone can cause constipation and the colace (docusate) will minimize or prevent constipation. Do not drive while taking or under the influence of the oxycodone as it is a narcotic medication. Use ice packs to help control pain. If you need a refill on your pain medication, please contact your pharmacy.  They will contact our office to request authorization. Prescriptions will not be filled after 5pm or on week-ends.  HOME MEDICATIONS Take your usually prescribed medications unless otherwise directed.  DIET You should follow a light diet the first few days after arrival home.  Be sure to include lots of fluids daily.   CONSTIPATION It is common to experience some constipation after surgery and if you are taking pain medication.  Increasing fluid intake and taking a stool  softener (such as Colace) will usually help or prevent this problem from occurring.  A mild laxative (Milk of Magnesia or Miralax) should be taken according to package instructions if there are no bowel movements after 48 hours.  WOUND/INCISION CARE Most patients will experience some swelling and bruising in the area of the incisions.  Ice packs will help.  Swelling and bruising can take several days to resolve.  May shower beginning 10/26/2022.  Do not peel off or scrub skin glue. May allow warm soapy water to run over incision, then rinse and pat dry.  Do not soak in any water (tubs, hot tubs, pools, lakes, oceans) for one week.   ACTIVITIES You may resume regular (light) daily activities beginning the next day--such as daily self-care, walking, climbing stairs--gradually increasing activities as tolerated.  You may have sexual intercourse when it is comfortable.   No lifting greater than 5 pounds for six weeks.  You may drive when you are no longer taking narcotic pain medication, you can comfortably wear a seatbelt, and you can safely maneuver your car and apply brakes.  FOLLOW-UP You should see your doctor in the office for a follow-up appointment approximately 2-3 weeks after your surgery.  You should have been given your post-op/follow-up appointment when your surgery was scheduled.  If you did not receive a post-op/follow-up appointment, make sure that you call for this appointment within a day or two after you arrive home to insure a convenient appointment time.  WHEN TO CALL YOUR DOCTOR: Fever over 101.5 Inability to urinate Continued bleeding from incision. Increased pain, redness, or drainage from the incision. Increasing abdominal pain  The clinic staff is  available to answer your questions during regular business hours.  Please don't hesitate to call and ask to speak to one of the nurses for clinical concerns.  If you have a medical emergency, go to the nearest emergency room or  call 911.  A surgeon from [facility] is always on call at the hospital. [address], [place], [place], [place]  [address] ? [address], [place], [place]   [address] [phone] ? [phone] ? [phone] Web site: [email or web address]

## 2022-10-26 NOTE — Progress Notes (Signed)
Per order, discharged patient.  Discussed s/s of infection, medications, follow up appts.  INT to right hand removed.  NADN. Patient and family verbalized understanding, denies additional needs.  Pt chooses ambulatory rather than wheelchair.  Sreece, RN

## 2022-10-26 NOTE — Progress Notes (Signed)
   10/26/22 1200  Mobility  Activity Ambulated independently in hallway  Level of Assistance Independent  Assistive Device None  Distance Ambulated (ft) 1100 ft  Activity Response Tolerated well  Mobility Referral Yes  $Mobility charge 1 Mobility   Mobility Specialist Progress Note  Received pt in bed having no complaints and agreeable to mobility. Pt was asymptomatic throughout ambulation and returned to room w/o fault. Left in bed w/ call bell in reach and all needs met.  Lucious Groves Mobility Specialist  Please contact via SecureChat or Rehab office at [phone]

## 2022-10-26 NOTE — Discharge Summary (Addendum)
Patient ID: Ray Bradley [id number] 1971-09-09 52 y.o.  Admit date: 10/25/2022 Discharge date: 10/26/2022  Admitting Diagnosis: Acute cholecystitis with elevated LFTs  Discharge Diagnosis Patient Active Problem List   Diagnosis Date Noted   History of laparoscopic cholecystectomy 10/25/2022   End-stage renal disease on hemodialysis ([place]) 10/22/2022   Essential hypertension 10/22/2022   Dyslipidemia 10/22/2022   Type 2 diabetes mellitus with chronic kidney disease, without long-term current use of insulin ([place]) 10/22/2022   Elevated LFTs 10/22/2022   Symptomatic cholelithiasis 10/21/2022   Dysthymia 08/11/2021   Hyperglycemia due to type 2 diabetes mellitus ([place]) 08/11/2021   Hypertensive retinopathy 08/11/2021   Personal history of colonic polyps 08/11/2021   Allergy, unspecified, initial encounter 04/24/2021   Mixed dyslipidemia 04/06/2021   Mild protein-calorie malnutrition ([place]) 03/31/2021   Hypercalcemia 07/29/2020   Hyperkalemia 05/28/2020   Anaphylactic shock, unspecified, initial encounter 03/31/2020   Class 2 obesity in adult 03/13/2020   Retinopathy due to secondary diabetes mellitus ([place]) 02/27/2020   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, subsequent encounter 01/04/2020   Long term (current) use of anticoagulants 12/14/2019   Other abnormal findings in urine 12/13/2019   Other hyperlipidemia 12/13/2019   Other specified diseases of liver 12/13/2019   Other disorders of phosphorus metabolism 12/05/2019   Fluid overload, unspecified 12/05/2019   ESRD on dialysis ([place]) 10/09/2019   Underimmunization status 08/07/2019   Anemia in chronic kidney disease 07/25/2019   Coagulation defect, unspecified ([place]) 88/91/6945   Complication of vascular dialysis catheter 07/25/2019   Diarrhea, unspecified 07/25/2019   Type 2 diabetes mellitus with diabetic neuropathy, unspecified ([place]) 07/25/2019   Dependence on renal dialysis ([place])  07/25/2019   Iron deficiency anemia, unspecified 07/25/2019   Pain, unspecified 07/25/2019   Encounter for immunization 07/25/2019   Pruritus, unspecified 07/25/2019   Secondary hyperparathyroidism of renal origin ([place]) 07/25/2019   Shortness of breath 07/25/2019   Type 2 diabetes mellitus with unspecified diabetic retinopathy without macular edema ([place]) 07/25/2019   CKD (chronic kidney disease) stage 5, GFR less than 15 ml/min ([place]) 10/17/2018   HTN (hypertension)    Diabetes ([place])    Chest pain     Consultants none  Reason for Admission: 31M with h/o ESRD on home HD (two days on and one day off), most recent complete session 2/4. Recent admission for abdominal pain, n/v and found to have transaminitis and hyperbilirubinemia. Normal CBD on [place] and normal MRCP. Patient left AMA that admission, but presents today for lap chole with IOC. No prior abdominal surgery.    Procedures Lap chole, Dr. Bobbye Morton 2/5  [facility] Course:  The patient was admitted and underwent a laparoscopic cholecystectomy but unable to get a cholangiogram.  The patient tolerated the procedure well.  On POD 1, the patient was tolerating a regular diet, voiding well, mobilizing, and pain was controlled with oral pain medications. His LFTs were downtrending and no further needs for work up noted. The patient was stable for DC home at this time with appropriate follow up made.   Physical Exam: Abd: soft, minimally tender, +BS, ND, incisions c/d/i  Allergies as of 10/26/2022   No Known Allergies      Medication List     TAKE these medications    acetaminophen 500 MG tablet Commonly known as: TYLENOL Take 2 tablets (1,000 mg total) by mouth 4 (four) times daily.   amLODipine 10 MG tablet Commonly known as: NORVASC Take 10 mg by mouth daily.   aspirin EC  81 MG tablet Take 81 mg by mouth daily.   calcitRIOL 0.5 MCG capsule Commonly known as: ROCALTROL Take 0.5 mcg by mouth daily.   carvedilol 12.5 MG  tablet Commonly known as: COREG Take 12.5-25 mg by mouth See admin instructions. 25 mg twice daily on non treatment days. 12.5-25 mg in the evening after treatment as needed for SBP > 120.   docusate sodium 100 MG capsule Commonly known as: Colace Take 1 capsule (100 mg total) by mouth 2 (two) times daily.   ferric citrate 1 GM 210 MG(Fe) tablet Commonly known as: AURYXIA Take 210 mg by mouth 3 (three) times daily with meals.   heparin sodium (porcine) 1000 UNIT/ML injection Inject 2,000 Units into the vein See admin instructions. 2000 units pre treatment (2 days on, 1 day off)   losartan 50 MG tablet Commonly known as: COZAAR Take 50 mg by mouth daily.   methocarbamol 750 MG tablet Commonly known as: Robaxin-750 Take 1 tablet (750 mg total) by mouth 4 (four) times daily.   MIRCERA IJ Inject 1 Dose into the skin See admin instructions. Inject dose after every treatment (2 days on, 1 day off)   oxyCODONE 5 MG immediate release tablet Commonly known as: Roxicodone Take 1 tablet (5 mg total) by mouth every 4 (four) hours as needed.   OZEMPIC (0.25 OR 0.5 MG/DOSE) [place] Inject 0.5 mg into the skin every Monday.   rosuvastatin 5 MG tablet Commonly known as: CRESTOR Take 5 mg by mouth daily.          Follow-up Information     Jesusita Oka, MD Follow up in 2 week(s).   Specialty: Surgery Why: For wound re-check Contact information: [place] [address] [phone]                 Signed: Saverio Danker, [facility] Surgery 10/26/2022, 10:53 AM Please see Amion for pager number during day hours 7:00am-4:30pm, 7-11:30am on Weekends

## 2022-10-26 NOTE — Progress Notes (Signed)
D/C order noted. Contacted [facility] home therapy dept to advise staff of pt's d/c today and pt should resume home therapies at d/c.   Melven Sartorius Renal Navigator [phone]

## 2022-11-11 ENCOUNTER — Other Ambulatory Visit: Payer: Self-pay | Admitting: Orthopedic Surgery

## 2022-12-28 ENCOUNTER — Encounter (HOSPITAL_COMMUNITY): Payer: Self-pay | Admitting: Orthopedic Surgery

## 2022-12-28 ENCOUNTER — Other Ambulatory Visit: Payer: Self-pay

## 2022-12-28 NOTE — Progress Notes (Signed)
Spoke with pt for pre-op call. Pt denies cardiac history. Pt has hx of HTN and Diabetes. Pt only takes Ozempic. Last dose was 12/24/22. Last A1C was 7.0 on 10/22/22. Pt states he does not check his blood sugar at home. Pt does home dialysis. He states he does 2 days in a row and 1 day off.   Shower instructions given to pt and he voiced understanding.

## 2022-12-29 ENCOUNTER — Encounter (HOSPITAL_COMMUNITY): Payer: Self-pay | Admitting: Orthopedic Surgery

## 2022-12-29 NOTE — Anesthesia Preprocedure Evaluation (Addendum)
Anesthesia Evaluation  Patient identified by MRN, date of birth, ID band Patient awake    Reviewed: Allergy & Precautions, NPO status , Patient's Chart, lab work & pertinent test results, reviewed documented beta blocker date and time   Airway Mallampati: II       Dental no notable dental hx. (+) Teeth Intact, Dental Advisory Given   Pulmonary shortness of breath, with exertion and at rest, asthma , pneumonia, resolved   Pulmonary exam normal breath sounds clear to auscultation       Cardiovascular hypertension, Pt. on medications and Pt. on home beta blockers Normal cardiovascular exam Rhythm:Regular Rate:Normal  EKG 10/25/22 NSR, Normal   Neuro/Psych  PSYCHIATRIC DISORDERS  Depression    Left Carpal tunnel syndrome Diabetic neuropathy negative neurological ROS     GI/Hepatic negative GI ROS, Neg liver ROS,,,  Endo/Other  diabetes, Well Controlled, Type 2  Hyperlipidemia GLP-1 RA therapy- last dose Secondary hyperparathyroidism Obesity  Renal/GU ESRF and DialysisRenal diseaseHx/o renal calculi Last dialysis yesterday pm  negative genitourinary   Musculoskeletal negative musculoskeletal ROS (+)    Abdominal  (+) + obese  Peds  Hematology  (+) Blood dyscrasia, anemia Heparin therapy   Anesthesia Other Findings   Reproductive/Obstetrics                              Anesthesia Physical Anesthesia Plan  ASA: 4  Anesthesia Plan: General   Post-op Pain Management: Minimal or no pain anticipated   Induction: Intravenous  PONV Risk Score and Plan: 2 and Treatment may vary due to age or medical condition, Propofol infusion and Ondansetron  Airway Management Planned: LMA  Additional Equipment: None  Intra-op Plan:   Post-operative Plan: Extubation in OR  Informed Consent: I have reviewed the patients History and Physical, chart, labs and discussed the procedure including the risks,  benefits and alternatives for the proposed anesthesia with the patient or authorized representative who has indicated his/her understanding and acceptance.     Dental advisory given  Plan Discussed with: CRNA and Anesthesiologist  Anesthesia Plan Comments:          Anesthesia Quick Evaluation

## 2022-12-30 ENCOUNTER — Ambulatory Visit (HOSPITAL_COMMUNITY)
Admission: RE | Admit: 2022-12-30 | Discharge: 2022-12-30 | Disposition: A | Discharge status: Home / Self Care | Payer: Medicare Other | Attending: Orthopedic Surgery | Admitting: Orthopedic Surgery

## 2022-12-30 ENCOUNTER — Other Ambulatory Visit: Payer: Self-pay

## 2022-12-30 ENCOUNTER — Encounter (HOSPITAL_COMMUNITY): Payer: Self-pay | Admitting: Orthopedic Surgery

## 2022-12-30 ENCOUNTER — Ambulatory Visit (HOSPITAL_BASED_OUTPATIENT_CLINIC_OR_DEPARTMENT_OTHER): Payer: Medicare Other | Admitting: Anesthesiology

## 2022-12-30 ENCOUNTER — Encounter (HOSPITAL_COMMUNITY)
Admission: RE | Disposition: A | Discharge status: Home / Self Care | Payer: Self-pay | Source: Home / Self Care | Attending: Orthopedic Surgery

## 2022-12-30 ENCOUNTER — Ambulatory Visit (HOSPITAL_COMMUNITY): Payer: Medicare Other | Admitting: Anesthesiology

## 2022-12-30 DIAGNOSIS — N186 End stage renal disease: Secondary | ICD-10-CM | POA: Insufficient documentation

## 2022-12-30 DIAGNOSIS — N2581 Secondary hyperparathyroidism of renal origin: Secondary | ICD-10-CM | POA: Insufficient documentation

## 2022-12-30 DIAGNOSIS — I120 Hypertensive chronic kidney disease with stage 5 chronic kidney disease or end stage renal disease: Secondary | ICD-10-CM

## 2022-12-30 DIAGNOSIS — D631 Anemia in chronic kidney disease: Secondary | ICD-10-CM | POA: Insufficient documentation

## 2022-12-30 DIAGNOSIS — Z87442 Personal history of urinary calculi: Secondary | ICD-10-CM | POA: Insufficient documentation

## 2022-12-30 DIAGNOSIS — E1122 Type 2 diabetes mellitus with diabetic chronic kidney disease: Secondary | ICD-10-CM | POA: Insufficient documentation

## 2022-12-30 DIAGNOSIS — J45909 Unspecified asthma, uncomplicated: Secondary | ICD-10-CM | POA: Diagnosis not present

## 2022-12-30 DIAGNOSIS — G5602 Carpal tunnel syndrome, left upper limb: Secondary | ICD-10-CM | POA: Insufficient documentation

## 2022-12-30 DIAGNOSIS — F32A Depression, unspecified: Secondary | ICD-10-CM | POA: Diagnosis not present

## 2022-12-30 DIAGNOSIS — E1140 Type 2 diabetes mellitus with diabetic neuropathy, unspecified: Secondary | ICD-10-CM | POA: Diagnosis not present

## 2022-12-30 DIAGNOSIS — R0602 Shortness of breath: Secondary | ICD-10-CM | POA: Diagnosis not present

## 2022-12-30 DIAGNOSIS — E669 Obesity, unspecified: Secondary | ICD-10-CM | POA: Insufficient documentation

## 2022-12-30 DIAGNOSIS — Z6837 Body mass index (BMI) 37.0-37.9, adult: Secondary | ICD-10-CM | POA: Insufficient documentation

## 2022-12-30 DIAGNOSIS — E785 Hyperlipidemia, unspecified: Secondary | ICD-10-CM | POA: Diagnosis not present

## 2022-12-30 DIAGNOSIS — Z992 Dependence on renal dialysis: Secondary | ICD-10-CM | POA: Insufficient documentation

## 2022-12-30 DIAGNOSIS — Z833 Family history of diabetes mellitus: Secondary | ICD-10-CM | POA: Diagnosis not present

## 2022-12-30 DIAGNOSIS — I10 Essential (primary) hypertension: Secondary | ICD-10-CM | POA: Insufficient documentation

## 2022-12-30 HISTORY — DX: Pneumonia, unspecified organism: J18.9

## 2022-12-30 HISTORY — DX: COVID-19: U07.1

## 2022-12-30 HISTORY — DX: Personal history of urinary calculi: Z87.442

## 2022-12-30 HISTORY — DX: Unspecified asthma, uncomplicated: J45.909

## 2022-12-30 HISTORY — DX: Anemia, unspecified: D64.9

## 2022-12-30 HISTORY — PX: CARPAL TUNNEL RELEASE: SHX101

## 2022-12-30 LAB — COMPREHENSIVE METABOLIC PANEL
ALT: 62 U/L — ABNORMAL HIGH (ref 0–44)
AST: 35 U/L (ref 15–41)
Albumin: 3.6 g/dL (ref 3.5–5.0)
Alkaline Phosphatase: 75 U/L (ref 38–126)
Anion gap: 14 (ref 5–15)
BUN: 52 mg/dL — ABNORMAL HIGH (ref 6–20)
CO2: 27 mmol/L (ref 22–32)
Calcium: 9.1 mg/dL (ref 8.9–10.3)
Chloride: 96 mmol/L — ABNORMAL LOW (ref 98–111)
Creatinine, Ser: 10.26 mg/dL — ABNORMAL HIGH (ref 0.61–1.24)
GFR, Estimated: 6 mL/min — ABNORMAL LOW (ref >60)
Glucose, Bld: 134 mg/dL — ABNORMAL HIGH (ref 70–99)
Potassium: 3.8 mmol/L (ref 3.5–5.1)
Sodium: 137 mmol/L (ref 135–145)
Total Bilirubin: 0.7 mg/dL (ref 0.3–1.2)
Total Protein: 7.6 g/dL (ref 6.5–8.1)

## 2022-12-30 LAB — CBC
HCT: 31.7 % — ABNORMAL LOW (ref 39.0–52.0)
Hemoglobin: 10.7 g/dL — ABNORMAL LOW (ref 13.0–17.0)
MCH: 29 pg (ref 26.0–34.0)
MCHC: 33.8 g/dL (ref 30.0–36.0)
MCV: 85.9 fL (ref 80.0–100.0)
Platelets: 195 10*3/uL (ref 150–400)
RBC: 3.69 MIL/uL — ABNORMAL LOW (ref 4.22–5.81)
RDW: 14.7 % (ref 11.5–15.5)
WBC: 5.2 10*3/uL (ref 4.0–10.5)
nRBC: 0 % (ref 0.0–0.2)

## 2022-12-30 LAB — GLUCOSE, CAPILLARY
Glucose-Capillary: 144 mg/dL — ABNORMAL HIGH (ref 70–99)
Glucose-Capillary: 127 mg/dL — ABNORMAL HIGH (ref 70–99)
Glucose-Capillary: 119 mg/dL — ABNORMAL HIGH (ref 70–99)

## 2022-12-30 SURGERY — CARPAL TUNNEL RELEASE
Anesthesia: General | Laterality: Left

## 2022-12-30 SURGERY — CARPAL TUNNEL RELEASE
Anesthesia: Choice | Laterality: Left

## 2022-12-30 MED ORDER — PHENYLEPHRINE 80 MCG/ML (10ML) SYRINGE FOR IV PUSH (FOR BLOOD PRESSURE SUPPORT)
PREFILLED_SYRINGE | INTRAVENOUS | Status: AC
  Filled 2022-12-30: qty 10

## 2022-12-30 MED ORDER — LACTATED RINGERS IV SOLN: INTRAVENOUS | Status: DC

## 2022-12-30 MED ORDER — EPHEDRINE 5 MG/ML INJ
INTRAVENOUS | Status: AC
  Filled 2022-12-30: qty 10

## 2022-12-30 MED ORDER — HYDROCODONE-ACETAMINOPHEN 5-325 MG PO TABS: ORAL_TABLET | ORAL | 0 refills | Status: DC

## 2022-12-30 MED ORDER — ONDANSETRON HCL 4 MG/2ML IJ SOLN
INTRAMUSCULAR | Status: DC | PRN
  Administered 2022-12-30: 4 mg via INTRAVENOUS

## 2022-12-30 MED ORDER — GLYCOPYRROLATE PF 0.2 MG/ML IJ SOSY
PREFILLED_SYRINGE | INTRAMUSCULAR | Status: DC | PRN
  Administered 2022-12-30: .2 mg via INTRAVENOUS

## 2022-12-30 MED ORDER — KETAMINE HCL 10 MG/ML IJ SOLN
INTRAMUSCULAR | Status: DC | PRN
  Administered 2022-12-30: 20 mg via INTRAVENOUS

## 2022-12-30 MED ORDER — ORAL CARE MOUTH RINSE: 15.0000 mL | Freq: Once | OROMUCOSAL | Status: AC

## 2022-12-30 MED ORDER — FENTANYL CITRATE (PF) 250 MCG/5ML IJ SOLN
INTRAMUSCULAR | Status: DC | PRN
  Administered 2022-12-30 (×4): 25 ug via INTRAVENOUS

## 2022-12-30 MED ORDER — PHENYLEPHRINE HCL-NACL 20-0.9 MG/250ML-% IV SOLN
INTRAVENOUS | Status: DC | PRN
  Administered 2022-12-30 (×2): 80 ug via INTRAVENOUS

## 2022-12-30 MED ORDER — BUPIVACAINE HCL (PF) 0.25 % IJ SOLN
INTRAMUSCULAR | Status: AC
  Filled 2022-12-30: qty 30

## 2022-12-30 MED ORDER — EPHEDRINE SULFATE-NACL 50-0.9 MG/10ML-% IV SOSY
PREFILLED_SYRINGE | INTRAVENOUS | Status: DC | PRN
  Administered 2022-12-30 (×3): 10 mg via INTRAVENOUS

## 2022-12-30 MED ORDER — BUPIVACAINE HCL (PF) 0.25 % IJ SOLN
INTRAMUSCULAR | Status: DC | PRN
  Administered 2022-12-30: 10 mL

## 2022-12-30 MED ORDER — MIDAZOLAM HCL 2 MG/2ML IJ SOLN
INTRAMUSCULAR | Status: AC
  Filled 2022-12-30: qty 2

## 2022-12-30 MED ORDER — OXYCODONE HCL 5 MG/5ML PO SOLN: 5.0000 mg | Freq: Once | ORAL | Status: DC | PRN

## 2022-12-30 MED ORDER — KETAMINE HCL 50 MG/5ML IJ SOSY
PREFILLED_SYRINGE | INTRAMUSCULAR | Status: AC
  Filled 2022-12-30: qty 5

## 2022-12-30 MED ORDER — MIDAZOLAM HCL 2 MG/2ML IJ SOLN
INTRAMUSCULAR | Status: DC | PRN
  Administered 2022-12-30: 2 mg via INTRAVENOUS

## 2022-12-30 MED ORDER — FENTANYL CITRATE (PF) 250 MCG/5ML IJ SOLN
INTRAMUSCULAR | Status: AC
  Filled 2022-12-30: qty 5

## 2022-12-30 MED ORDER — LIDOCAINE 2% (20 MG/ML) 5 ML SYRINGE
INTRAMUSCULAR | Status: AC
  Filled 2022-12-30: qty 5

## 2022-12-30 MED ORDER — ONDANSETRON HCL 4 MG/2ML IJ SOLN
INTRAMUSCULAR | Status: AC
  Filled 2022-12-30: qty 2

## 2022-12-30 MED ORDER — INSULIN ASPART 100 UNIT/ML IJ SOLN: 0.0000 [IU] | INTRAMUSCULAR | Status: DC | PRN

## 2022-12-30 MED ORDER — SODIUM CHLORIDE 0.9 % IV SOLN: INTRAVENOUS | Status: DC | PRN

## 2022-12-30 MED ORDER — CHLORHEXIDINE GLUCONATE 0.12 % MT SOLN
15.0000 mL | Freq: Once | OROMUCOSAL | Status: AC
  Administered 2022-12-30: 15 mL via OROMUCOSAL
  Filled 2022-12-30: qty 15

## 2022-12-30 MED ORDER — FENTANYL CITRATE (PF) 100 MCG/2ML IJ SOLN
25.0000 ug | INTRAMUSCULAR | Status: DC | PRN
  Administered 2022-12-30: 50 ug via INTRAVENOUS

## 2022-12-30 MED ORDER — SODIUM CHLORIDE 0.9 % IV SOLN: Freq: Once | INTRAVENOUS | Status: AC

## 2022-12-30 MED ORDER — FENTANYL CITRATE (PF) 100 MCG/2ML IJ SOLN
INTRAMUSCULAR | Status: AC
  Filled 2022-12-30: qty 2

## 2022-12-30 MED ORDER — PROPOFOL 10 MG/ML IV BOLUS
INTRAVENOUS | Status: DC | PRN
  Administered 2022-12-30: 300 mg via INTRAVENOUS

## 2022-12-30 MED ORDER — LIDOCAINE 2% (20 MG/ML) 5 ML SYRINGE
INTRAMUSCULAR | Status: DC | PRN
  Administered 2022-12-30: 100 mg via INTRAVENOUS

## 2022-12-30 MED ORDER — ONDANSETRON HCL 4 MG/2ML IJ SOLN: 4.0000 mg | Freq: Once | INTRAMUSCULAR | Status: DC | PRN

## 2022-12-30 MED ORDER — GLYCOPYRROLATE PF 0.2 MG/ML IJ SOSY
PREFILLED_SYRINGE | INTRAMUSCULAR | Status: AC
  Filled 2022-12-30: qty 1

## 2022-12-30 MED ORDER — CEFAZOLIN IN SODIUM CHLORIDE 3-0.9 GM/100ML-% IV SOLN
3.0000 g | INTRAVENOUS | Status: AC
  Administered 2022-12-30: 3 g via INTRAVENOUS
  Filled 2022-12-30: qty 100

## 2022-12-30 MED ORDER — OXYCODONE HCL 5 MG PO TABS: 5.0000 mg | ORAL_TABLET | Freq: Once | ORAL | Status: DC | PRN

## 2022-12-30 SURGICAL SUPPLY — 49 items
APL PRP STRL LF DISP 70% ISPRP (MISCELLANEOUS) ×1
BAG COUNTER SPONGE SURGICOUNT (BAG) ×2 IMPLANT
BAG SPNG CNTER NS LX DISP (BAG) ×1
BNDG CMPR 5X3 KNIT ELC UNQ LF (GAUZE/BANDAGES/DRESSINGS) ×1
BNDG CMPR 9X4 STRL LF SNTH (GAUZE/BANDAGES/DRESSINGS) ×1
BNDG ELASTIC 3INX 5YD STR LF (GAUZE/BANDAGES/DRESSINGS) ×2 IMPLANT
BNDG ELASTIC 4X5.8 VLCR STR LF (GAUZE/BANDAGES/DRESSINGS) ×2 IMPLANT
BNDG ESMARK 4X9 LF (GAUZE/BANDAGES/DRESSINGS) ×2 IMPLANT
BNDG GAUZE DERMACEA FLUFF 4 (GAUZE/BANDAGES/DRESSINGS) ×2 IMPLANT
BNDG GZE DERMACEA 4 6PLY (GAUZE/BANDAGES/DRESSINGS) ×1
CHLORAPREP W/TINT 26 (MISCELLANEOUS) ×2 IMPLANT
CORD BIPOLAR FORCEPS 12FT (ELECTRODE) ×2 IMPLANT
COVER SURGICAL LIGHT HANDLE (MISCELLANEOUS) ×2 IMPLANT
CUFF TOURN SGL QUICK 18X4 (TOURNIQUET CUFF) ×2 IMPLANT
CUFF TOURN SGL QUICK 24 (TOURNIQUET CUFF)
CUFF TRNQT CYL 24X4X16.5-23 (TOURNIQUET CUFF) IMPLANT
DRAPE SURG 17X23 STRL (DRAPES) ×2 IMPLANT
DRSG XEROFORM 1X8 (GAUZE/BANDAGES/DRESSINGS) IMPLANT
GAUZE PAD ABD 7.5X8 STRL (GAUZE/BANDAGES/DRESSINGS) IMPLANT
GAUZE PAD ABD 8X10 STRL (GAUZE/BANDAGES/DRESSINGS) ×4 IMPLANT
GAUZE SPONGE 4X4 12PLY STRL (GAUZE/BANDAGES/DRESSINGS) ×2 IMPLANT
GAUZE XEROFORM 1X8 LF (GAUZE/BANDAGES/DRESSINGS) ×2 IMPLANT
GLOVE BIO SURGEON STRL SZ7.5 (GLOVE) ×4 IMPLANT
GLOVE BIOGEL PI IND STRL 8 (GLOVE) ×2 IMPLANT
GOWN STRL REUS W/ TWL LRG LVL3 (GOWN DISPOSABLE) ×4 IMPLANT
GOWN STRL REUS W/TWL LRG LVL3 (GOWN DISPOSABLE) ×2
KIT BASIN OR (CUSTOM PROCEDURE TRAY) ×2 IMPLANT
KIT TURNOVER KIT B (KITS) ×2 IMPLANT
NDL HYPO 22X1.5 SAFETY MO (MISCELLANEOUS) IMPLANT
NDL HYPO 25GX1X1/2 BEV (NEEDLE) IMPLANT
NEEDLE HYPO 22X1.5 SAFETY MO (MISCELLANEOUS) ×1 IMPLANT
NEEDLE HYPO 25GX1X1/2 BEV (NEEDLE) IMPLANT
NS IRRIG 1000ML POUR BTL (IV SOLUTION) ×2 IMPLANT
PACK ORTHO EXTREMITY (CUSTOM PROCEDURE TRAY) ×2 IMPLANT
PAD ARMBOARD 7.5X6 YLW CONV (MISCELLANEOUS) ×4 IMPLANT
PADDING CAST ABS COTTON 4X4 ST (CAST SUPPLIES) ×2 IMPLANT
SPONGE T-LAP 4X18 ~~LOC~~+RFID (SPONGE) ×2 IMPLANT
STOCKINETTE IMPERVIOUS 9X36 MD (GAUZE/BANDAGES/DRESSINGS) IMPLANT
SUCTION FRAZIER HANDLE 10FR (MISCELLANEOUS)
SUCTION TUBE FRAZIER 10FR DISP (MISCELLANEOUS) IMPLANT
SUT ETHILON 3 0 PS 1 (SUTURE) ×2 IMPLANT
SUT ETHILON 4 0 PS 2 18 (SUTURE) IMPLANT
SUT VIC AB 3-0 SH 18 (SUTURE) ×2 IMPLANT
SYR CONTROL 10ML LL (SYRINGE) IMPLANT
TOWEL GREEN STERILE (TOWEL DISPOSABLE) ×2 IMPLANT
TOWEL GREEN STERILE FF (TOWEL DISPOSABLE) ×2 IMPLANT
TUBE CONNECTING 12X1/4 (SUCTIONS) IMPLANT
UNDERPAD 30X36 HEAVY ABSORB (UNDERPADS AND DIAPERS) ×2 IMPLANT
WATER STERILE IRR 1000ML POUR (IV SOLUTION) ×2 IMPLANT

## 2022-12-30 NOTE — Anesthesia Postprocedure Evaluation (Signed)
Anesthesia Post Note  Patient: Glass blower/designer  Procedure(s) Performed: LEFT CARPAL TUNNEL RELEASE (Left)     Patient location during evaluation: PACU Anesthesia Type: General Level of consciousness: awake and alert and oriented Pain management: pain level controlled Vital Signs Assessment: post-procedure vital signs reviewed and stable Respiratory status: spontaneous breathing, nonlabored ventilation and respiratory function stable Cardiovascular status: blood pressure returned to baseline and stable Postop Assessment: no apparent nausea or vomiting Anesthetic complications: no   No notable events documented.  Last Vitals:  Vitals:   12/30/22 1025 12/30/22 1030  BP:  137/69  Pulse: 74 75  Resp: 15 14  Temp:  (!) 36.2 C  SpO2: 94% 93%    Last Pain:  Vitals:   12/30/22 1000  TempSrc:   PainSc: 0-No pain                 Tayanna Talford A.

## 2022-12-30 NOTE — H&P (Signed)
Ray Bradley is an 52 y.o. male.   Chief Complaint: carpal tunnel syndrome HPI: 52 y.o. yo male with numbness and tingling left hand.  Positive nerve conduction studies. Has had injection with good relief of symptoms.  He wishes to have left carpal tunnel release.   Allergies: No Known Allergies  Past Medical History:  Diagnosis Date   Anemia    Asthma    as a child   Chest pain    Chronic kidney disease    dialysis - at home hemodialysis -does 2 days in a row with one day off   COVID    2022 - mild case   History of kidney stones    HTN (hypertension)    Hyperlipidemia    Pneumonia    as a child   Type II or unspecified type diabetes mellitus without mention of complication, not stated as uncontrolled    Wears glasses     Past Surgical History:  Procedure Laterality Date   AV FISTULA PLACEMENT Left 08/24/2019   Procedure: CREATION OF ARTERIOVENOUS (AV) FISTULA  LEFT ARM;  Surgeon: Cephus Shelling, MD;  Location: [facility] OR;  Service: Vascular;  Laterality: Left;   CHOLECYSTECTOMY N/A 10/25/2022   Procedure: LAPAROSCOPIC CHOLECYSTECTOMY;  Surgeon: Diamantina Monks, MD;  Location: [facility] OR;  Service: General;  Laterality: N/A;   CIRCUMCISION, NON-NEWBORN  2004   COLONOSCOPY W/ BIOPSIES AND POLYPECTOMY     EYE SURGERY     FOOT SURGERY     IR FLUORO GUIDE CV LINE RIGHT  07/23/2019   IR FLUORO GUIDE CV LINE RIGHT  09/19/2019   IR US GUIDE VASC ACCESS RIGHT  07/23/2019   MULTIPLE TOOTH EXTRACTIONS      Family History: Family History  Problem Relation Age of Onset   Colon cancer Father    Diabetes Other        family history   Hypertension Other        family history   Aneurysm Mother        died age 60, brain   Hypertension Mother    Hypertension Sister 55   Hypertension Sister 68   Hypertension Brother 13    Social History:   reports that he has never smoked. He has been exposed to tobacco smoke. He has never used smokeless tobacco. He reports that he does not  currently use alcohol. He reports that he does not use drugs.  Medications: Medications Prior to Admission  Medication Sig Dispense Refill   amLODipine (NORVASC) 10 MG tablet Take 10 mg by mouth daily.     aspirin EC 81 MG tablet Take 81 mg by mouth daily.      calcitRIOL (ROCALTROL) 0.5 MCG capsule Take 0.5 mcg by mouth 2 (two) times a week.     carvedilol (COREG) 12.5 MG tablet Take 12.5-25 mg by mouth See admin instructions. 25 mg twice daily on non treatment days. 12.5-25 mg in the evening after treatment as needed for SBP > 120.     ferric citrate (AURYXIA) 1 GM 210 MG(Fe) tablet Take 420 mg by mouth 3 (three) times daily with meals.     heparin sodium, porcine, 1000 UNIT/ML injection Inject 2,000 Units into the vein See admin instructions. 2000 units pre treatment (2 days on, 1 day off)     losartan (COZAAR) 50 MG tablet Take 50 mg by mouth daily.     Methoxy PEG-Epoetin Beta (MIRCERA IJ) Inject 1 Dose into the skin See admin instructions. Inject  dose after every treatment (2 days on, 1 day off)     rosuvastatin (CRESTOR) 5 MG tablet Take 5 mg by mouth daily.      Semaglutide (OZEMPIC, 0.25 OR 0.5 MG/DOSE, [place]) Inject 0.5 mg into the skin every Friday.      Results for orders placed or performed during the hospital encounter of 12/30/22 (from the past 48 hour(s))  Glucose, capillary     Status: Abnormal   Collection Time: 12/30/22  6:35 AM  Result Value Ref Range   Glucose-Capillary 144 (H) 70 - 99 mg/dL    Comment: Glucose reference range applies only to samples taken after fasting for at least 8 hours.  CBC per protocol     Status: Abnormal   Collection Time: 12/30/22  6:50 AM  Result Value Ref Range   WBC 5.2 4.0 - 10.5 K/uL   RBC 3.69 (L) 4.22 - 5.81 MIL/uL   Hemoglobin 10.7 (L) 13.0 - 17.0 g/dL   HCT 51.7 (L) 00.1 - 74.9 %   MCV 85.9 80.0 - 100.0 fL   MCH 29.0 26.0 - 34.0 pg   MCHC 33.8 30.0 - 36.0 g/dL   RDW 44.9 67.5 - 91.6 %   Platelets 195 150 - 400 K/uL   nRBC 0.0  0.0 - 0.2 %    Comment: Performed at [facility], [address], [place], [place] [address]  Comprehensive metabolic panel per protocol     Status: Abnormal   Collection Time: 12/30/22  6:50 AM  Result Value Ref Range   Sodium 137 135 - 145 mmol/L   Potassium 3.8 3.5 - 5.1 mmol/L   Chloride 96 (L) 98 - 111 mmol/L   CO2 27 22 - 32 mmol/L   Glucose, Bld 134 (H) 70 - 99 mg/dL    Comment: Glucose reference range applies only to samples taken after fasting for at least 8 hours.   BUN 52 (H) 6 - 20 mg/dL   Creatinine, Ser 59.93 (H) 0.61 - 1.24 mg/dL   Calcium 9.1 8.9 - 57.0 mg/dL   Total Protein 7.6 6.5 - 8.1 g/dL   Albumin 3.6 3.5 - 5.0 g/dL   AST 35 15 - 41 U/L   ALT 62 (H) 0 - 44 U/L   Alkaline Phosphatase 75 38 - 126 U/L   Total Bilirubin 0.7 0.3 - 1.2 mg/dL   GFR, Estimated 6 (L) >60 mL/min    Comment: (NOTE) Calculated using the CKD-EPI Creatinine Equation (2021)    Anion gap 14 5 - 15    Comment: Performed at [facility], [address] [place], [place] [address]  Glucose, capillary     Status: Abnormal   Collection Time: 12/30/22  8:16 AM  Result Value Ref Range   Glucose-Capillary 127 (H) 70 - 99 mg/dL    Comment: Glucose reference range applies only to samples taken after fasting for at least 8 hours.    No results found.    Blood pressure (!) 160/81, pulse 74, temperature 98.6 F (37 C), temperature source Oral, resp. rate 16, height 5\' 10"  (1.778 m), weight 117.9 kg, SpO2 99 %.  General appearance: alert, cooperative, and appears stated age Head: Normocephalic, without obvious abnormality, atraumatic Neck: supple, symmetrical, trachea midline Extremities: Intact sensation and capillary refill all digits.  +epl/fpl/io.  No wounds.  Pulses: 2+ and symmetric Skin: Skin color, texture, turgor normal. No rashes or lesions Neurologic: Grossly normal Incision/Wound: none  Assessment/Plan Left carpal tunnel syndrome.  Non operative and operative  treatment options have been discussed with the patient and patient wishes to proceed with operative treatment. Risks, benefits and alternatives of surgery were discussed including risks of blood loss, infection, damage to nerves/vessels/tendons/ligament/bone, failure of surgery, need for additional surgery, complication with wound healing, stiffness, recurrence.  He voiced understanding of these risks and elected to proceed.    Betha LoaKevin Debra Calabretta 12/30/2022, 8:26 AM

## 2022-12-30 NOTE — Addendum Note (Signed)
Addendum  created 12/30/22 1043 by Loleta Victor Langenbach, CRNA   Intraprocedure Meds edited

## 2022-12-30 NOTE — Transfer of Care (Signed)
Immediate Anesthesia Transfer of Care Note  Patient: Ray Bradley  Procedure(s) Performed: LEFT CARPAL TUNNEL RELEASE (Left)  Patient Location: PACU  Anesthesia Type:General  Level of Consciousness: sedated  Airway & Oxygen Therapy: Patient Spontanous Breathing and Patient connected to face mask oxygen  Post-op Assessment: Report given to RN and Post -op Vital signs reviewed and stable  Post vital signs: Reviewed and stable  Last Vitals:  Vitals Value Taken Time  BP 126/63 12/30/22 0939  Temp    Pulse 75 12/30/22 0943  Resp 14 12/30/22 0943  SpO2 100 % 12/30/22 0943  Vitals shown include unvalidated device data.  Last Pain:  Vitals:   12/30/22 0710  TempSrc:   PainSc: 0-No pain         Complications: No notable events documented.

## 2022-12-30 NOTE — Progress Notes (Signed)
Orthopedic Tech Progress Note Patient Details:  Ray Bradley 30-May-1971 [id number]  Aneta Mins, RN called from PACU with an order for a sling.  Ortho Devices Type of Ortho Device: Arm sling Ortho Device/Splint Location: LUE Ortho Device/Splint Interventions: Ordered, Application, Adjustment   Post Interventions Patient Tolerated: Well Instructions Provided: Care of device, Adjustment of device  Ray Bradley [name] 12/30/2022, 11:20 AM

## 2022-12-30 NOTE — Op Note (Signed)
I assisted Surgeon(s) and Role:    * Betha Loa, MD - Primary    Cindee Salt, MD - Assisting on the Procedure(s): LEFT CARPAL TUNNEL RELEASE on 12/30/2022.  I provided assistance on this case as follows: Set up, approach, retraction for visualization and hemostasis location of the nerve with release and closure of the wound and application of the dressing.  Electronically signed by: Cindee Salt, MD Date: 12/30/2022 Time: 9:32 AM

## 2022-12-30 NOTE — Discharge Instructions (Signed)

## 2022-12-30 NOTE — Op Note (Signed)
12/30/2022 [facility] OR                              OPERATIVE REPORT   PREOPERATIVE DIAGNOSIS:  Left carpal tunnel syndrome.  POSTOPERATIVE DIAGNOSIS:  Left carpal tunnel syndrome.  PROCEDURE:  Left carpal tunnel release.  SURGEON:  Betha Loa, MD  ASSISTANT: Cindee Salt, MD  ANESTHESIA: General  IV FLUIDS:  Per anesthesia flow sheet.  ESTIMATED BLOOD LOSS:  Minimal.  COMPLICATIONS:  None.  SPECIMENS:  None.  TOURNIQUET TIME:   * No tourniquets in log *  DISPOSITION:  Stable to PACU.  LOCATION: [facility] OR  INDICATIONS:  52 y.o. yo male with numbness and tingling left hand.  Positive nerve conduction studies. He wishes to have left carpal tunnel release.  He wishes to have a carpal tunnel release for management of his symptoms.  Risks, benefits and alternatives of surgery were discussed including the risk of blood loss; infection; damage to nerves, vessels, tendons, ligaments, bone; failure of surgery; need for additional surgery; complications with wound healing; continued pain; recurrence of carpal tunnel syndrome; and damage to motor branch. He voiced understanding of these risks and elected to proceed.   OPERATIVE COURSE:  After being identified preoperatively by myself, the patient and I agreed upon the procedure and site of procedure.  The surgical site was marked.  Surgical consent had been signed.  He was given IV Ancef as preoperative antibiotic prophylaxis.  He was transferred to the operating room and placed on the operating room table in supine position with the Left upper extremity on an armboard.  General anesthesia was induced by the anesthesiologist.  Left upper extremity was prepped and draped in normal sterile orthopaedic fashion.  A surgical pause was performed between the surgeons, anesthesia, and operating room staff, and all were in agreement as to the patient, procedure, and site of procedure.  No tourniquet was used due to his fistula.  Incision was made over the  transverse carpal ligament and carried into the subcutaneous tissues by spreading technique.  Bipolar electrocautery was used to obtain hemostasis.  The palmar fascia was sharply incised.  The transverse carpal ligament was identified.  The fascia distal to the ligament was opened.  Retractor was placed and the flexor tendons were identified.  The flexor tendon to the little finger was identified and retracted radially.  The transverse carpal ligament was then incised from distal to proximal under direct visualization.  The incision was extended proximally to the wrist crease.  Bipolar electrocautery was again used to obtain hemostasis.  The volar antebrachial fascia was sharply incised under direct visualization while protecting the nerve.  The remainder of the transverse carpal ligament was incised.  The nerve was examined.  It was flattened and hyperemic.  The motor branch was identified and was intact.  The wound was copiously irrigated with sterile saline.  It was then closed with 4-0 nylon in a horizontal mattress fashion.  It was injected with 0.25% plain Marcaine to aid in postoperative analgesia.  It was dressed with sterile Xeroform, 4x4s, an ABD, and wrapped very lightly with Kerlix and an Ace bandage.  Fingertips were pink with brisk capillary refill after deflation of the tourniquet.  Operative drapes were broken down.  The patient was awoken from anesthesia safely.  He was transferred back to stretcher and taken to the PACU in stable condition.  I will see him back in the office in 1  week for postoperative followup.  I will give him a prescription for Norco 5/325 1-2 tabs PO q6 hours prn pain, dispense # 20.    Betha Loa, MD Electronically signed, 12/30/22

## 2023-03-14 ENCOUNTER — Encounter: Payer: Self-pay | Admitting: Podiatry

## 2023-03-14 ENCOUNTER — Ambulatory Visit (INDEPENDENT_AMBULATORY_CARE_PROVIDER_SITE_OTHER): Payer: Medicare Other | Admitting: Podiatry

## 2023-03-14 DIAGNOSIS — L603 Nail dystrophy: Secondary | ICD-10-CM | POA: Diagnosis not present

## 2023-03-14 DIAGNOSIS — E1149 Type 2 diabetes mellitus with other diabetic neurological complication: Secondary | ICD-10-CM

## 2023-03-14 NOTE — Patient Instructions (Addendum)

## 2023-03-14 NOTE — Progress Notes (Unsigned)
Subjective: Chief Complaint  Patient presents with   Nail Problem    2nd toenail right - concerned about darkness of nail x 2 months, tried using kersal on the nail, seemed to lighten a bit, last A1c was 73.64   52 year old male presents the office with above concerns.  Concerned that the right second digit toenail.  Not sure if it split that he was using Kerasal on the toenail and hypercoagulable little bit.  No injuries that he reports.  No swelling redness or drainage.  No open lesions.  Objective: AAO x3, NAD DP/PT pulses palpable bilaterally, CRT less than 3 seconds Right second digit nail is dystrophic with brown discoloration.  No hyperpigmentation.  Upon debridement the nail split distally.  No edema, erythema or signs of infection. No pain with calf compression, swelling, warmth, erythema  Assessment: Right hallux onychodystrophy, onychomycosis  Plan: -All treatment options discussed with the patient including all alternatives, risks, complications.  -Sharply debrided the nail with any complications or bleeding. The nail to continue throughout the monitoring signs or symptoms of infection.  Continue the topical antifungal. -Daily foot inspection. -Patient encouraged to call the office with any questions, concerns, change in symptoms.   Vivi Barrack DPM

## 2023-04-26 ENCOUNTER — Ambulatory Visit (INDEPENDENT_AMBULATORY_CARE_PROVIDER_SITE_OTHER): Payer: Medicare Other

## 2023-04-26 ENCOUNTER — Ambulatory Visit
Admission: RE | Admit: 2023-04-26 | Discharge: 2023-04-26 | Disposition: A | Discharge status: Home / Self Care | Payer: Medicare Other | Source: Ambulatory Visit | Attending: Internal Medicine | Admitting: Internal Medicine

## 2023-04-26 ENCOUNTER — Other Ambulatory Visit: Payer: Self-pay

## 2023-04-26 VITALS — BP 171/91 | HR 76 | Temp 98.0°F | Resp 18

## 2023-04-26 DIAGNOSIS — M25512 Pain in left shoulder: Secondary | ICD-10-CM

## 2023-04-26 DIAGNOSIS — W19XXXA Unspecified fall, initial encounter: Secondary | ICD-10-CM | POA: Diagnosis not present

## 2023-04-26 NOTE — ED Provider Notes (Signed)
[name]    [name]: [id number] Arrival date & time: 04/26/23  1508      History   Chief Complaint Chief Complaint  Patient presents with   Shoulder Pain    Entered by patient    HPI Ray Bradley is a 52 y.o. male.   Patient presents for left shoulder pain after a fall that occurred yesterday.  Patient reports that he was moving heavy boxes on his flatbed trailer when he lost grip due to the handles breaking on the boxes causing him to fall backwards off the trailer.  He thinks that he stuck his left arm out to catch himself but is not sure.  He reports that he thinks he hit his head on the rubber bumper but is not sure.  Denies loss of consciousness.  He denies any persistent headache, nausea, vomiting, dizziness, blurred vision.   Patient states the pain is much better today but he wants it evaluated.  Patient's blood pressure is elevated.  Reports he has not taken his blood pressure medication today.  Patient not reporting any chest pain, shortness of breath, dizziness, blurry vision, nausea, vomiting, headache.   Shoulder Pain   Past Medical History:  Diagnosis Date   Anemia    Asthma    as a child   Chest pain    Chronic kidney disease    dialysis - at home hemodialysis -does 2 days in a row with one day off   COVID    2022 - mild case   History of kidney stones    HTN (hypertension)    Hyperlipidemia    Pneumonia    as a child   Type II or unspecified type diabetes mellitus without mention of complication, not stated as uncontrolled    Wears glasses     Patient Active Problem List   Diagnosis Date Noted   History of laparoscopic cholecystectomy 10/25/2022   End-stage renal disease on hemodialysis (HCC) 10/22/2022   Essential hypertension 10/22/2022   Dyslipidemia 10/22/2022   Type 2 diabetes mellitus with chronic kidney disease, without long-term current use of insulin (HCC) 10/22/2022   Elevated LFTs 10/22/2022   Symptomatic cholelithiasis  10/21/2022   Dysthymia 08/11/2021   Hyperglycemia due to type 2 diabetes mellitus (HCC) 08/11/2021   Hypertensive retinopathy 08/11/2021   Personal history of colonic polyps 08/11/2021   Allergy, unspecified, initial encounter 04/24/2021   Mixed dyslipidemia 04/06/2021   Mild protein-calorie malnutrition (HCC) 03/31/2021   Hypercalcemia 07/29/2020   Hyperkalemia 05/28/2020   Anaphylactic shock, unspecified, initial encounter 03/31/2020   Class 2 obesity in adult 03/13/2020   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) 02/27/2020   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, subsequent encounter 01/04/2020   Long term (current) use of anticoagulants 12/14/2019   Other abnormal findings in urine 12/13/2019   Other hyperlipidemia 12/13/2019   Other specified diseases of liver 12/13/2019   Other disorders of phosphorus metabolism 12/05/2019   Fluid overload, unspecified 12/05/2019   ESRD on dialysis (HCC) 10/09/2019   Underimmunization status 08/07/2019   Anemia in chronic kidney disease 07/25/2019   Coagulation defect, unspecified (HCC) 07/25/2019   Complication of vascular dialysis catheter 07/25/2019   Diarrhea, unspecified 07/25/2019   Type 2 diabetes mellitus with diabetic neuropathy, unspecified (HCC) 07/25/2019   Dependence on renal dialysis (HCC) 07/25/2019   Iron deficiency anemia, unspecified 07/25/2019   Pain, unspecified 07/25/2019   Encounter for immunization 07/25/2019   Pruritus, unspecified 07/25/2019   Secondary hyperparathyroidism  of renal origin (HCC) 07/25/2019   Shortness of breath 07/25/2019   Type 2 diabetes mellitus with unspecified diabetic retinopathy without macular edema (HCC) 07/25/2019   CKD (chronic kidney disease) stage 5, GFR less than 15 ml/min (HCC) 10/17/2018   HTN (hypertension)    Diabetes (HCC)    Chest pain     Past Surgical History:  Procedure Laterality Date   AV FISTULA PLACEMENT Left 08/24/2019    Procedure: CREATION OF ARTERIOVENOUS (AV) FISTULA  LEFT ARM;  Surgeon: Cephus Shelling, MD;  Location: [facility] OR;  Service: Vascular;  Laterality: Left;   CARPAL TUNNEL RELEASE Left 12/30/2022   Procedure: LEFT CARPAL TUNNEL RELEASE;  Surgeon: Betha Loa, MD;  Location: [facility] OR;  Service: Orthopedics;  Laterality: Left;  30 MIN   CHOLECYSTECTOMY N/A 10/25/2022   Procedure: LAPAROSCOPIC CHOLECYSTECTOMY;  Surgeon: Diamantina Monks, MD;  Location: [facility] OR;  Service: General;  Laterality: N/A;   CIRCUMCISION, NON-NEWBORN  2004   COLONOSCOPY W/ BIOPSIES AND POLYPECTOMY     EYE SURGERY     FOOT SURGERY     IR FLUORO GUIDE CV LINE RIGHT  07/23/2019   IR FLUORO GUIDE CV LINE RIGHT  09/19/2019   IR US GUIDE VASC ACCESS RIGHT  07/23/2019   MULTIPLE TOOTH EXTRACTIONS         Home Medications    Prior to Admission medications   Medication Sig Start Date End Date Taking? Authorizing Provider  amitriptyline (ELAVIL) 25 MG tablet Take by mouth. 02/16/23   [provider]  amLODipine (NORVASC) 10 MG tablet Take 10 mg by mouth daily.    [provider]  aspirin EC 81 MG tablet Take 81 mg by mouth daily.     [provider]  calcitRIOL (ROCALTROL) 0.5 MCG capsule Take 0.5 mcg by mouth 2 (two) times a week.    [provider]  carvedilol (COREG) 12.5 MG tablet Take 12.5-25 mg by mouth See admin instructions. 25 mg twice daily on non treatment days. 12.5-25 mg in the evening after treatment as needed for SBP > 120.    [provider]  ferric citrate (AURYXIA) 1 GM 210 MG(Fe) tablet Take 420 mg by mouth 3 (three) times daily with meals.    [provider]  heparin sodium, porcine, 1000 UNIT/ML injection Inject 2,000 Units into the vein See admin instructions. 2000 units pre treatment (2 days on, 1 day off) 02/04/20   [provider]  HYDROcodone-acetaminophen (NORCO) 5-325 MG tablet 1-2 tabs po q6 hours prn pain 12/30/22   Betha Loa, MD  losartan  (COZAAR) 50 MG tablet Take 50 mg by mouth daily. 10/02/19   [provider]  Methoxy PEG-Epoetin Beta (MIRCERA IJ) Inject 1 Dose into the skin See admin instructions. Inject dose after every treatment (2 days on, 1 day off) 07/27/21   [provider]  rosuvastatin (CRESTOR) 5 MG tablet Take 5 mg by mouth daily.  08/20/18   [provider]  Semaglutide (OZEMPIC, 0.25 OR 0.5 MG/DOSE, [place]) Inject 0.5 mg into the skin every Friday.    [provider]    Family History Family History  Problem Relation Age of Onset   Colon cancer Father    Diabetes Other        family history   Hypertension Other        family history   Aneurysm Mother        died age 11, brain   Hypertension Mother  Hypertension Sister 66   Hypertension Sister 5   Hypertension Brother 22    Social History Social History   Tobacco Use   Smoking status: Never    Passive exposure: Past   Smokeless tobacco: Never  Vaping Use   Vaping status: Never Used  Substance Use Topics   Alcohol use: Not Currently   Drug use: No     Allergies   Patient has no known allergies.   Review of Systems Review of Systems Per HPI  Physical Exam Triage Vital Signs ED Triage Vitals  Encounter Vitals Group     BP 04/26/23 1551 (!) 171/91     Systolic BP Percentile --      Diastolic BP Percentile --      Pulse Rate 04/26/23 1551 76     Resp 04/26/23 1551 18     Temp 04/26/23 1551 98 F (36.7 C)     Temp Source 04/26/23 1551 Oral     SpO2 04/26/23 1551 96 %     Weight --      Height --      Head Circumference --      Peak Flow --      Pain Score 04/26/23 1553 4     Pain Loc --      Pain Education --      Exclude from Growth Chart --    No data found.  Updated Vital Signs BP (!) 171/91 (BP Location: Right Arm)   Pulse 76   Temp 98 F (36.7 C) (Oral)   Resp 18   SpO2 96%   Visual Acuity Right Eye Distance:   Left Eye Distance:   Bilateral Distance:    Right Eye Near:    Left Eye Near:    Bilateral Near:     Physical Exam Constitutional:      General: He is not in acute distress.    Appearance: Normal appearance. He is not toxic-appearing or diaphoretic.  HENT:     Head: Normocephalic and atraumatic.  Eyes:     Extraocular Movements: Extraocular movements intact.     Conjunctiva/sclera: Conjunctivae normal.  Pulmonary:     Effort: Pulmonary effort is normal.  Musculoskeletal:     Comments: No obvious tenderness to palpation to left shoulder.  Patient reports pain occurs with movement at the lateral portion of the shoulder.  No obvious abrasions, lacerations, discoloration, swelling noted.  Full range of motion present.  Grip strength 5/5.  Appears neurovascularly intact.  Neurological:     General: No focal deficit present.     Mental Status: He is alert and oriented to person, place, and time. Mental status is at baseline.     Cranial Nerves: Cranial nerves 2-12 are intact.     Sensory: Sensation is intact.     Motor: Motor function is intact.     Coordination: Coordination is intact.     Gait: Gait is intact.  Psychiatric:        Mood and Affect: Mood normal.        Behavior: Behavior normal.        Thought Content: Thought content normal.        Judgment: Judgment normal.      UC Treatments / Results  Labs (all labs ordered are listed, but only abnormal results are displayed) Labs Reviewed - No data to display  EKG   Radiology DG Shoulder Left  Result Date: 04/26/2023 CLINICAL DATA:  fall, shoulder pain EXAM: LEFT SHOULDER -  2+ VIEW COMPARISON:  None Available. FINDINGS: There is no evidence of fracture or dislocation. There is no evidence of severe arthropathy or other focal bone abnormality. Soft tissues are unremarkable. IMPRESSION: Negative. Electronically Signed   By: Tish Frederickson M.D.   On: 04/26/2023 16:46    Procedures Procedures (including critical care time)  Medications Ordered in UC Medications - No data to  display  Initial Impression / Assessment and Plan / UC Course  I have reviewed the triage vital signs and the nursing notes.  Pertinent labs & imaging results that were available during my care of the patient were reviewed by me and considered in my medical decision making (see chart for details).     Patient reports that he may or may not have hit his head but he is denying any persistent headache, dizziness, blurred vision, nausea, vomiting and neuroexam is normal.  Therefore, do not think imaging of the head is necessary.  X-ray of the left shoulder was negative for any acute bony abnormality.  Suspect possible muscle strain/injury versus contusion given mechanism of injury.  No obvious indication for muscular tear on exam.  Advised patient of ice application.  Advised following up with orthopedist at provided contact information if symptoms persist or worsen.  Patient's blood pressure is elevated but he reports that he has not had not his blood pressure medication today.  Encouraged him to take his blood pressure medication and follow-up if it remains elevated.  Patient verbalized understanding and was agreeable with plan. Final Clinical Impressions(s) / UC Diagnoses   Final diagnoses:  Acute pain of left shoulder  Fall, initial encounter     Discharge Instructions      X-ray was normal.  Apply ice.  Follow-up with orthopedist if symptoms persist or worsen.     ED Prescriptions   None    PDMP not reviewed this encounter.   Ray Bradley, [place] 04/26/23 [phone]

## 2023-04-26 NOTE — Discharge Instructions (Signed)
X-ray was normal.  Apply ice.  Follow-up with orthopedist if symptoms persist or worsen.

## 2023-04-26 NOTE — ED Triage Notes (Signed)
Pt here for left shoulder pain after fall off his trailer yesterday; pt sts arm was bent back; pt sts improved pain today but wanted to be checked

## 2023-05-23 ENCOUNTER — Inpatient Hospital Stay (HOSPITAL_COMMUNITY)
Admission: EM | Admit: 2023-05-23 | Discharge: 2023-05-28 | DRG: 871 | Disposition: A | Discharge status: Home / Self Care | Payer: Medicare Other | Attending: Internal Medicine | Admitting: Internal Medicine

## 2023-05-23 ENCOUNTER — Emergency Department (HOSPITAL_COMMUNITY): Payer: Medicare Other

## 2023-05-23 ENCOUNTER — Other Ambulatory Visit: Payer: Self-pay

## 2023-05-23 ENCOUNTER — Encounter (HOSPITAL_COMMUNITY): Payer: Self-pay

## 2023-05-23 DIAGNOSIS — J45909 Unspecified asthma, uncomplicated: Secondary | ICD-10-CM | POA: Diagnosis present

## 2023-05-23 DIAGNOSIS — E1140 Type 2 diabetes mellitus with diabetic neuropathy, unspecified: Secondary | ICD-10-CM | POA: Diagnosis present

## 2023-05-23 DIAGNOSIS — Z9049 Acquired absence of other specified parts of digestive tract: Secondary | ICD-10-CM

## 2023-05-23 DIAGNOSIS — R197 Diarrhea, unspecified: Secondary | ICD-10-CM | POA: Diagnosis present

## 2023-05-23 DIAGNOSIS — Z800 Family history of malignant neoplasm of digestive organs: Secondary | ICD-10-CM

## 2023-05-23 DIAGNOSIS — E1122 Type 2 diabetes mellitus with diabetic chronic kidney disease: Secondary | ICD-10-CM | POA: Diagnosis present

## 2023-05-23 DIAGNOSIS — E1165 Type 2 diabetes mellitus with hyperglycemia: Secondary | ICD-10-CM | POA: Diagnosis present

## 2023-05-23 DIAGNOSIS — Z1152 Encounter for screening for COVID-19: Secondary | ICD-10-CM

## 2023-05-23 DIAGNOSIS — R7989 Other specified abnormal findings of blood chemistry: Secondary | ICD-10-CM | POA: Diagnosis present

## 2023-05-23 DIAGNOSIS — E876 Hypokalemia: Secondary | ICD-10-CM | POA: Diagnosis present

## 2023-05-23 DIAGNOSIS — N186 End stage renal disease: Secondary | ICD-10-CM | POA: Diagnosis not present

## 2023-05-23 DIAGNOSIS — D72810 Lymphocytopenia: Secondary | ICD-10-CM

## 2023-05-23 DIAGNOSIS — M25512 Pain in left shoulder: Secondary | ICD-10-CM | POA: Diagnosis present

## 2023-05-23 DIAGNOSIS — I10 Essential (primary) hypertension: Secondary | ICD-10-CM

## 2023-05-23 DIAGNOSIS — I441 Atrioventricular block, second degree: Secondary | ICD-10-CM | POA: Diagnosis present

## 2023-05-23 DIAGNOSIS — A4153 Sepsis due to Serratia: Secondary | ICD-10-CM | POA: Diagnosis not present

## 2023-05-23 DIAGNOSIS — I120 Hypertensive chronic kidney disease with stage 5 chronic kidney disease or end stage renal disease: Secondary | ICD-10-CM | POA: Diagnosis present

## 2023-05-23 DIAGNOSIS — Z6837 Body mass index (BMI) 37.0-37.9, adult: Secondary | ICD-10-CM

## 2023-05-23 DIAGNOSIS — M47816 Spondylosis without myelopathy or radiculopathy, lumbar region: Secondary | ICD-10-CM | POA: Diagnosis present

## 2023-05-23 DIAGNOSIS — Z8249 Family history of ischemic heart disease and other diseases of the circulatory system: Secondary | ICD-10-CM

## 2023-05-23 DIAGNOSIS — Z833 Family history of diabetes mellitus: Secondary | ICD-10-CM

## 2023-05-23 DIAGNOSIS — D709 Neutropenia, unspecified: Secondary | ICD-10-CM | POA: Diagnosis present

## 2023-05-23 DIAGNOSIS — Z7985 Long-term (current) use of injectable non-insulin antidiabetic drugs: Secondary | ICD-10-CM

## 2023-05-23 DIAGNOSIS — I4892 Unspecified atrial flutter: Secondary | ICD-10-CM | POA: Diagnosis present

## 2023-05-23 DIAGNOSIS — A419 Sepsis, unspecified organism: Secondary | ICD-10-CM

## 2023-05-23 DIAGNOSIS — Z992 Dependence on renal dialysis: Secondary | ICD-10-CM

## 2023-05-23 DIAGNOSIS — Z7982 Long term (current) use of aspirin: Secondary | ICD-10-CM

## 2023-05-23 DIAGNOSIS — G4700 Insomnia, unspecified: Secondary | ICD-10-CM | POA: Diagnosis present

## 2023-05-23 DIAGNOSIS — E782 Mixed hyperlipidemia: Secondary | ICD-10-CM | POA: Diagnosis present

## 2023-05-23 DIAGNOSIS — R571 Hypovolemic shock: Secondary | ICD-10-CM | POA: Diagnosis present

## 2023-05-23 DIAGNOSIS — E785 Hyperlipidemia, unspecified: Secondary | ICD-10-CM | POA: Diagnosis present

## 2023-05-23 DIAGNOSIS — I480 Paroxysmal atrial fibrillation: Secondary | ICD-10-CM

## 2023-05-23 DIAGNOSIS — E872 Acidosis, unspecified: Secondary | ICD-10-CM | POA: Diagnosis present

## 2023-05-23 DIAGNOSIS — D6959 Other secondary thrombocytopenia: Secondary | ICD-10-CM | POA: Diagnosis present

## 2023-05-23 DIAGNOSIS — R911 Solitary pulmonary nodule: Secondary | ICD-10-CM | POA: Diagnosis present

## 2023-05-23 DIAGNOSIS — N2581 Secondary hyperparathyroidism of renal origin: Secondary | ICD-10-CM | POA: Diagnosis present

## 2023-05-23 DIAGNOSIS — E861 Hypovolemia: Secondary | ICD-10-CM | POA: Diagnosis present

## 2023-05-23 DIAGNOSIS — R509 Fever, unspecified: Principal | ICD-10-CM

## 2023-05-23 DIAGNOSIS — I9589 Other hypotension: Secondary | ICD-10-CM | POA: Diagnosis not present

## 2023-05-23 DIAGNOSIS — Z79899 Other long term (current) drug therapy: Secondary | ICD-10-CM

## 2023-05-23 DIAGNOSIS — J690 Pneumonitis due to inhalation of food and vomit: Secondary | ICD-10-CM | POA: Diagnosis present

## 2023-05-23 DIAGNOSIS — E669 Obesity, unspecified: Secondary | ICD-10-CM | POA: Diagnosis present

## 2023-05-23 DIAGNOSIS — D631 Anemia in chronic kidney disease: Secondary | ICD-10-CM | POA: Diagnosis present

## 2023-05-23 DIAGNOSIS — D72819 Decreased white blood cell count, unspecified: Secondary | ICD-10-CM | POA: Insufficient documentation

## 2023-05-23 DIAGNOSIS — R6521 Severe sepsis with septic shock: Secondary | ICD-10-CM

## 2023-05-23 LAB — CBC WITH DIFFERENTIAL/PLATELET
Abs Immature Granulocytes: 0.02 10*3/uL (ref 0.00–0.07)
Basophils Absolute: 0 10*3/uL (ref 0.0–0.1)
Basophils Relative: 1 %
Eosinophils Absolute: 0 10*3/uL (ref 0.0–0.5)
Eosinophils Relative: 0 %
HCT: 29.4 % — ABNORMAL LOW (ref 39.0–52.0)
Hemoglobin: 9.6 g/dL — ABNORMAL LOW (ref 13.0–17.0)
Immature Granulocytes: 1 %
Lymphocytes Relative: 16 %
Lymphs Abs: 0.3 10*3/uL — ABNORMAL LOW (ref 0.7–4.0)
MCH: 27.5 pg (ref 26.0–34.0)
MCHC: 32.7 g/dL (ref 30.0–36.0)
MCV: 84.2 fL (ref 80.0–100.0)
Monocytes Absolute: 0 10*3/uL — ABNORMAL LOW (ref 0.1–1.0)
Monocytes Relative: 1 %
Neutro Abs: 1.3 10*3/uL — ABNORMAL LOW (ref 1.7–7.7)
Neutrophils Relative %: 81 %
Platelets: 182 10*3/uL (ref 150–400)
RBC: 3.49 MIL/uL — ABNORMAL LOW (ref 4.22–5.81)
RDW: 15.3 % (ref 11.5–15.5)
WBC: 1.6 10*3/uL — ABNORMAL LOW (ref 4.0–10.5)
nRBC: 1.9 % — ABNORMAL HIGH (ref 0.0–0.2)

## 2023-05-23 LAB — PROTIME-INR
INR: 1.2 (ref 0.8–1.2)
Prothrombin Time: 15.3 seconds — ABNORMAL HIGH (ref 11.4–15.2)

## 2023-05-23 LAB — CBC
HCT: 33.2 % — ABNORMAL LOW (ref 39.0–52.0)
Hemoglobin: 10.8 g/dL — ABNORMAL LOW (ref 13.0–17.0)
MCH: 27.3 pg (ref 26.0–34.0)
MCHC: 32.5 g/dL (ref 30.0–36.0)
MCV: 84.1 fL (ref 80.0–100.0)
Platelets: 212 10*3/uL (ref 150–400)
RBC: 3.95 MIL/uL — ABNORMAL LOW (ref 4.22–5.81)
RDW: 15.1 % (ref 11.5–15.5)
WBC: 0.6 10*3/uL — CL (ref 4.0–10.5)
nRBC: 3.1 % — ABNORMAL HIGH (ref 0.0–0.2)

## 2023-05-23 LAB — BASIC METABOLIC PANEL
Anion gap: 20 — ABNORMAL HIGH (ref 5–15)
BUN: 54 mg/dL — ABNORMAL HIGH (ref 6–20)
CO2: 24 mmol/L (ref 22–32)
Calcium: 9.4 mg/dL (ref 8.9–10.3)
Chloride: 95 mmol/L — ABNORMAL LOW (ref 98–111)
Creatinine, Ser: 10.4 mg/dL — ABNORMAL HIGH (ref 0.61–1.24)
GFR, Estimated: 6 mL/min — ABNORMAL LOW (ref >60)
Glucose, Bld: 142 mg/dL — ABNORMAL HIGH (ref 70–99)
Potassium: 4.8 mmol/L (ref 3.5–5.1)
Sodium: 139 mmol/L (ref 135–145)

## 2023-05-23 LAB — APTT: aPTT: 27 s (ref 24–36)

## 2023-05-23 LAB — RESP PANEL BY RT-PCR (RSV, FLU A&B, COVID)  RVPGX2
Influenza A by PCR: NEGATIVE
Influenza B by PCR: NEGATIVE
Resp Syncytial Virus by PCR: NEGATIVE
SARS Coronavirus 2 by RT PCR: NEGATIVE

## 2023-05-23 LAB — BRAIN NATRIURETIC PEPTIDE: B Natriuretic Peptide: 25.9 pg/mL (ref 0.0–100.0)

## 2023-05-23 LAB — CBG MONITORING, ED: Glucose-Capillary: 141 mg/dL — ABNORMAL HIGH (ref 70–99)

## 2023-05-23 LAB — TROPONIN I (HIGH SENSITIVITY): Troponin I (High Sensitivity): 10 ng/L (ref <18)

## 2023-05-23 MED ORDER — VANCOMYCIN HCL IN DEXTROSE 1-5 GM/200ML-% IV SOLN
1000.0000 mg | Freq: Once | INTRAVENOUS | Status: AC
  Administered 2023-05-23: 1000 mg via INTRAVENOUS
  Filled 2023-05-23: qty 200

## 2023-05-23 MED ORDER — LACTATED RINGERS IV BOLUS
1000.0000 mL | Freq: Once | INTRAVENOUS | Status: AC
  Administered 2023-05-23: 1000 mL via INTRAVENOUS

## 2023-05-23 MED ORDER — LACTATED RINGERS IV SOLN: INTRAVENOUS | Status: DC

## 2023-05-23 MED ORDER — METRONIDAZOLE 500 MG/100ML IV SOLN
500.0000 mg | Freq: Once | INTRAVENOUS | Status: AC
  Administered 2023-05-23: 500 mg via INTRAVENOUS
  Filled 2023-05-23: qty 100

## 2023-05-23 MED ORDER — ACETAMINOPHEN 500 MG PO TABS
1000.0000 mg | ORAL_TABLET | Freq: Once | ORAL | Status: AC
  Administered 2023-05-23: 1000 mg via ORAL
  Filled 2023-05-23: qty 2

## 2023-05-23 MED ORDER — SODIUM CHLORIDE 0.9 % IV SOLN
2.0000 g | Freq: Once | INTRAVENOUS | Status: AC
  Administered 2023-05-23: 2 g via INTRAVENOUS
  Filled 2023-05-23: qty 12.5

## 2023-05-23 MED ORDER — VANCOMYCIN HCL IN DEXTROSE 1-5 GM/200ML-% IV SOLN: 1000.0000 mg | Freq: Once | INTRAVENOUS | Status: DC

## 2023-05-23 NOTE — H&P (Incomplete)
History and Physical    Horace Robards ZOX:[id number] DOB: [birth year] DOA: 05/23/2023  PCP: Tally Joe, MD   Patient coming from: Home   Chief Complaint:  Chief Complaint  Patient presents with  . Chest Pain    HPI:  Jahmal Mcclarnon is a 52 y.o. male with medical history significant of ESRD on HD, essential hypertension, hyperlipidemia and DM type II Presented to emergency department for evaluation for complaint of chest pain and jaw pain.  Patient reported symptoms started when he was doing dialysis at home.   ED Course:  At presentation to ED patient found tachycardic 140, hypotensive 98/48, and O2 sat 98% on 5 L.    BMP grossly unremarkable except evidence of ESRD. CBC showing low WBC count 0.6 and repeat is 1.6, RBC 3.49, hemoglobin 9.6, hematocrit 29, platelet count 182.  Absolute neutrophil count 1.3.  High-sensitivity troponin 10. BNP 25. Respiratory panel negative. Blood cultures x 2 in process. Pending lactic acid level.  Chest x-ray showing low lung volumes with mild perihilar and bibasilar interstitial prominence. CT abdomen pelvis: .1. Right L4-L5 facet joint arthritis with mild erosive change. Correlation with site of pain is recommended. If there is concern for septic arthritis, MRI is recommended. 2. Multiple pulmonary nodules are present bilaterally measuring up to 7 mm with surrounding ground-glass opacity. This is nonspecific and can be seen in a variety of infectious, neoplastic, and inflammatory conditions including septic emboli. Consider dedicated CT chest for further evaluation. 3. Nondistended thick-walled bladder. Correlate with urinalysis.   Review of Systems:  ROS  Past Medical History:  Diagnosis Date  . Anemia   . Asthma    as a child  . Chest pain   . Chronic kidney disease    dialysis - at home hemodialysis -does 2 days in a row with one day off  . COVID    2022 - mild case  . History of kidney stones   . HTN (hypertension)    . Hyperlipidemia   . Pneumonia    as a child  . Type II or unspecified type diabetes mellitus without mention of complication, not stated as uncontrolled   . Wears glasses     Past Surgical History:  Procedure Laterality Date  . AV FISTULA PLACEMENT Left 08/24/2019   Procedure: CREATION OF ARTERIOVENOUS (AV) FISTULA  LEFT ARM;  Surgeon: Cephus Shelling, MD;  Location: [facility] OR;  Service: Vascular;  Laterality: Left;  . CARPAL TUNNEL RELEASE Left 12/30/2022   Procedure: LEFT CARPAL TUNNEL RELEASE;  Surgeon: Betha Loa, MD;  Location: [facility] OR;  Service: Orthopedics;  Laterality: Left;  30 MIN  . CHOLECYSTECTOMY N/A 10/25/2022   Procedure: LAPAROSCOPIC CHOLECYSTECTOMY;  Surgeon: Diamantina Monks, MD;  Location: [facility] OR;  Service: General;  Laterality: N/A;  . CIRCUMCISION, NON-NEWBORN  2004  . COLONOSCOPY W/ BIOPSIES AND POLYPECTOMY    . EYE SURGERY    . FOOT SURGERY    . IR FLUORO GUIDE CV LINE RIGHT  07/23/2019  . IR FLUORO GUIDE CV LINE RIGHT  09/19/2019  . IR US GUIDE VASC ACCESS RIGHT  07/23/2019  . MULTIPLE TOOTH EXTRACTIONS       reports that he has never smoked. He has been exposed to tobacco smoke. He has never used smokeless tobacco. He reports that he does not currently use alcohol. He reports that he does not use drugs.  No Known Allergies  Family History  Problem Relation Age of Onset  . Colon cancer Father   .  Diabetes Other        family history  . Hypertension Other        family history  . Aneurysm Mother        died age 5, brain  . Hypertension Mother   . Hypertension Sister 27  . Hypertension Sister 28  . Hypertension Brother 49    Prior to Admission medications   Medication Sig Start Date End Date Taking? Authorizing Provider  amitriptyline (ELAVIL) 25 MG tablet Take by mouth. 02/16/23   [provider]  amLODipine (NORVASC) 10 MG tablet Take 10 mg by mouth daily.    [provider]  aspirin EC 81 MG tablet Take 81 mg by mouth daily.      [provider]  calcitRIOL (ROCALTROL) 0.5 MCG capsule Take 0.5 mcg by mouth 2 (two) times a week.    [provider]  carvedilol (COREG) 12.5 MG tablet Take 12.5-25 mg by mouth See admin instructions. 25 mg twice daily on non treatment days. 12.5-25 mg in the evening after treatment as needed for SBP > 120.    [provider]  ferric citrate (AURYXIA) 1 GM 210 MG(Fe) tablet Take 420 mg by mouth 3 (three) times daily with meals.    [provider]  heparin sodium, porcine, 1000 UNIT/ML injection Inject 2,000 Units into the vein See admin instructions. 2000 units pre treatment (2 days on, 1 day off) 02/04/20   [provider]  HYDROcodone-acetaminophen (NORCO) 5-325 MG tablet 1-2 tabs po q6 hours prn pain 12/30/22   Betha Loa, MD  losartan (COZAAR) 50 MG tablet Take 50 mg by mouth daily. 10/02/19   [provider]  Methoxy PEG-Epoetin Beta (MIRCERA IJ) Inject 1 Dose into the skin See admin instructions. Inject dose after every treatment (2 days on, 1 day off) 07/27/21   [provider]  rosuvastatin (CRESTOR) 5 MG tablet Take 5 mg by mouth daily.  08/20/18   [provider]  Semaglutide (OZEMPIC, 0.25 OR 0.5 MG/DOSE, [place]) Inject 0.5 mg into the skin every Friday.    [provider]     Physical Exam: Vitals:   05/23/23 2130 05/23/23 2145 05/23/23 2200 05/23/23 2230  BP: (!) 104/59 (!) 121/48 (!) 131/44 (!) 102/55  Pulse: (!) 143 (!) 146 (!) 144   SpO2: 96% 93% 98%   Weight:      Height:        Physical Exam   Labs on Admission: I have personally reviewed following labs and imaging studies  CBC: Recent Labs  Lab 05/23/23 2044 05/23/23 2155  WBC 0.6* 1.6*  NEUTROABS  --  1.3*  HGB 10.8* 9.6*  HCT 33.2* 29.4*  MCV 84.1 84.2  PLT 212 182   Basic Metabolic Panel: Recent Labs  Lab 05/23/23 2044  NA 139  K 4.8  CL 95*  CO2 24  GLUCOSE 142*  BUN 54*  CREATININE 10.40*  CALCIUM 9.4    GFR: Estimated Creatinine Clearance: 11 mL/min (A) (by C-G formula based on SCr of 10.4 mg/dL (H)). Liver Function Tests: No results for input(s): "AST", "ALT", "ALKPHOS", "BILITOT", "PROT", "ALBUMIN" in the last 168 hours. No results for input(s): "LIPASE", "AMYLASE" in the last 168 hours. No results for input(s): "AMMONIA" in the last 168 hours. Coagulation Profile: Recent Labs  Lab 05/23/23 2052  INR 1.2   Cardiac Enzymes: Recent Labs  Lab 05/23/23 2044  TROPONINIHS 10   BNP (last 3 results) Recent Labs    05/23/23  2044  BNP 25.9   HbA1C: No results for input(s): "HGBA1C" in the last 72 hours. CBG: Recent Labs  Lab 05/23/23 2037  GLUCAP 141*   Lipid Profile: No results for input(s): "CHOL", "HDL", "LDLCALC", "TRIG", "CHOLHDL", "LDLDIRECT" in the last 72 hours. Thyroid Function Tests: No results for input(s): "TSH", "T4TOTAL", "FREET4", "T3FREE", "THYROIDAB" in the last 72 hours. Anemia Panel: No results for input(s): "VITAMINB12", "FOLATE", "FERRITIN", "TIBC", "IRON", "RETICCTPCT" in the last 72 hours. Urine analysis:    Component Value Date/Time   COLORURINE YELLOW 05/25/2021 1106   APPEARANCEUR HAZY (A) 05/25/2021 1106   LABSPEC 1.005 05/25/2021 1106   PHURINE 6.0 05/25/2021 1106   GLUCOSEU 50 (A) 05/25/2021 1106   HGBUR MODERATE (A) 05/25/2021 1106   BILIRUBINUR NEGATIVE 05/25/2021 1106   BILIRUBINUR negative 05/25/2021 1001   KETONESUR NEGATIVE 05/25/2021 1106   KETONESUR negative 05/25/2021 1001   PROTEINUR 100 (A) 05/25/2021 1106   PROTEINUR =100 (A) 05/25/2021 1001   UROBILINOGEN 0.2 05/25/2021 1001   NITRITE NEGATIVE 05/25/2021 1106   NITRITE Negative 05/25/2021 1001   LEUKOCYTESUR LARGE (A) 05/25/2021 1106   LEUKOCYTESUR Trace (A) 05/25/2021 1001    Radiological Exams on Admission: I have personally reviewed images CT ABDOMEN PELVIS WO CONTRAST  Result Date: 05/23/2023 CLINICAL DATA:  Sepsis.  Lower back pain. EXAM: CT ABDOMEN AND PELVIS  WITHOUT CONTRAST TECHNIQUE: Multidetector CT imaging of the abdomen and pelvis was performed following the standard protocol without IV contrast. RADIATION DOSE REDUCTION: This exam was performed according to the departmental dose-optimization program which includes automated exposure control, adjustment of the mA and/or kV according to patient size and/or use of iterative reconstruction technique. COMPARISON:  MRI abdomen 10/22/2022 and CT abdomen and pelvis 10/21/2022 FINDINGS: Lower chest: Multiple new pulmonary nodules are present in the lung bases. For example in the left lower lobe posteriorly on series 3/image 17 measuring 7 mm. Additional nodules are present in the right lower lobe on 3/10 and 3/7 and right middle lobe on 3/5. There is mild ground-glass opacity about each nodule. Hepatobiliary: Cholecystectomy. Unremarkable noncontrast appearance of the liver. No biliary dilation. Pancreas: Unremarkable. Spleen: Unremarkable. Adrenals/Urinary Tract: Stable adrenal glands. No urinary calculi or hydronephrosis. Nondistended thick-walled bladder. Stomach/Bowel: Normal caliber large and small bowel. No bowel wall thickening. The appendix is normal.Stomach is within normal limits. Vascular/Lymphatic: Aortic atherosclerosis. No enlarged abdominal or pelvic lymph nodes. Reproductive: Unremarkable. Other: No free intraperitoneal fluid or air. Musculoskeletal: No acute fracture. Right L4-L5 facet arthritis with mild erosive change. IMPRESSION: 1. Right L4-L5 facet joint arthritis with mild erosive change. Correlation with site of pain is recommended. If there is concern for septic arthritis, MRI is recommended. 2. Multiple pulmonary nodules are present bilaterally measuring up to 7 mm with surrounding ground-glass opacity. This is nonspecific and can be seen in a variety of infectious, neoplastic, and inflammatory conditions including septic emboli. Consider dedicated CT chest for further evaluation. 3. Nondistended  thick-walled bladder. Correlate with urinalysis. Aortic Atherosclerosis (ICD10-I70.0). Electronically Signed   By: Minerva Fester M.D.   On: 05/23/2023 23:20   DG Chest Port 1 View  Result Date: 05/23/2023 CLINICAL DATA:  Questionable sepsis - evaluate for abnormality EXAM: PORTABLE CHEST - 1 VIEW COMPARISON:  11/19/2013 FINDINGS: Low lung volumes. Mild perihilar and bibasilar interstitial prominence. No confluent airspace disease. Heart size and mediastinal contours are within normal limits. No effusion. Visualized bones unremarkable. IMPRESSION: Low lung volumes with mild perihilar and bibasilar interstitial prominence. Electronically Signed   By: Algis Downs  Deanne Coffer M.D.   On: 05/23/2023 21:32    EKG: My personal interpretation of EKG shows: ***    Assessment/Plan: Active Problems:   * No active hospital problems. *    Assessment and Plan: No notes have been filed under this hospital service. Service: Hospitalist      DVT prophylaxis:  {Blank single:19197::"Lovenox","SQ Heparin","IV heparin gtts","Xarelto","Eliquis","Coumadin","SCDs","***"} Code Status:  {Blank single:19197::"Full Code","DNR with Intubation","DNR/DNI(Do NOT Intubate)","Comfort Care","***"} Diet:  Family Communication:  ***  Disposition Plan:  ***  Consults:  ***  Admission status:   {Blank single:19197::"Observation","Inpatient"}, {Blank single:19197::"Med-Surg","Telemetry bed","Step Down Unit"}  Severity of Illness: {Observation/Inpatient:21159}    Tereasa Coop, MD [facility]  How to contact the [facility] Attending or Consulting provider 7A - 7P or covering provider during after hours 7P -7A, for this patient.  Check the care team in [facility] and look for a) attending/consulting [facility] provider listed and b) the [facility] team listed Log into [email or web address] and use [place]'s universal password to access. If you do not have the password, please contact the hospital operator. Locate the [facility] provider you are looking for  under [facility] and page to a number that you can be directly reached. If you still have difficulty reaching the provider, please page the [facility] (Director on Call) for the Hospitalists listed on amion for assistance.  05/23/2023, 11:59 PM

## 2023-05-23 NOTE — H&P (Addendum)
History and Physical    Jahmiere Hearn ZOX:[id number] DOB: 05/08/1971 DOA: 05/23/2023  PCP: Tally Joe, MD   Patient coming from: Home   Chief Complaint:  Chief Complaint  Patient presents with   Chest Pain    HPI:  Ray Bradley is a 52 y.o. male with medical history significant of ESRD on HD, essential hypertension, hyperlipidemia and DM type II Presented to emergency department for evaluation for complaint of fever, chill and back pain.  Patient reported symptoms started when he was doing dialysis at home.  Patient reported he has noticed some back pain, fever and chills at home.  Denies any chest pain, shortness of breath, palpitation, nausea, vomiting and diarrhea.  Denies any known sick contact.  Denies any cough, shortness of breath and sputum production.   ED Course:  At presentation to ED patient found tachycardic 140, hypotensive 98/48, and O2 sat 98% on 5 L.    BMP grossly unremarkable except evidence of ESRD. CBC showing low WBC count 0.6 and repeat is 1.6, RBC 3.49, hemoglobin 9.6, hematocrit 29, platelet count 182.  Absolute neutrophil count 1.3.  EKG showed atrial fibrillation with 2 is to 1 AV block.  High-sensitivity troponin 10. BNP 25. Respiratory panel negative. Blood cultures x 2 in process. Pending lactic acid level.  Chest x-ray showing low lung volumes with mild perihilar and bibasilar interstitial prominence. CT abdomen pelvis: .1. Right L4-L5 facet joint arthritis with mild erosive change. Correlation with site of pain is recommended. If there is concern for septic arthritis, MRI is recommended. 2. Multiple pulmonary nodules are present bilaterally measuring up to 7 mm with surrounding ground-glass opacity. This is nonspecific and can be seen in a variety of infectious, neoplastic, and inflammatory conditions including septic emboli. Consider dedicated CT chest for further evaluation. 3. Nondistended thick-walled bladder. Correlate with  urinalysis.  Due to soft blood pressure and MAP 64 in the ED patient has been resuscitated with LR 1 L bolus.  Also received 1 dose of cefepime, vancomycin and metronidazole.  Hospitalist has been contacted for the management of sepsis and leukopenia.  Given patient has soft blood pressure and persistently tachycardic  Review of Systems:  Review of Systems  Constitutional:  Negative for chills and fever.  Respiratory:  Positive for shortness of breath. Negative for cough and sputum production.   Cardiovascular:  Positive for palpitations. Negative for chest pain.  Gastrointestinal:  Negative for abdominal pain, heartburn, nausea and vomiting.  Musculoskeletal:  Negative for myalgias and neck pain.  Skin:  Negative for rash.  Neurological:  Negative for dizziness and headaches.  Psychiatric/Behavioral:  The patient is not nervous/anxious.     Past Medical History:  Diagnosis Date   Anemia    Asthma    as a child   Chest pain    Chronic kidney disease    dialysis - at home hemodialysis -does 2 days in a row with one day off   COVID    2022 - mild case   History of kidney stones    HTN (hypertension)    Hyperlipidemia    Pneumonia    as a child   Type II or unspecified type diabetes mellitus without mention of complication, not stated as uncontrolled    Wears glasses     Past Surgical History:  Procedure Laterality Date   AV FISTULA PLACEMENT Left 08/24/2019   Procedure: CREATION OF ARTERIOVENOUS (AV) FISTULA  LEFT ARM;  Surgeon: Cephus Shelling, MD;  Location: [facility] OR;  Service: Vascular;  Laterality: Left;   CARPAL TUNNEL RELEASE Left 12/30/2022   Procedure: LEFT CARPAL TUNNEL RELEASE;  Surgeon: Betha Loa, MD;  Location: [facility] OR;  Service: Orthopedics;  Laterality: Left;  30 MIN   CHOLECYSTECTOMY N/A 10/25/2022   Procedure: LAPAROSCOPIC CHOLECYSTECTOMY;  Surgeon: Diamantina Monks, MD;  Location: [facility] OR;  Service: General;  Laterality: N/A;   CIRCUMCISION, NON-NEWBORN   2004   COLONOSCOPY W/ BIOPSIES AND POLYPECTOMY     EYE SURGERY     FOOT SURGERY     IR FLUORO GUIDE CV LINE RIGHT  07/23/2019   IR FLUORO GUIDE CV LINE RIGHT  09/19/2019   IR US GUIDE VASC ACCESS RIGHT  07/23/2019   MULTIPLE TOOTH EXTRACTIONS       reports that he has never smoked. He has been exposed to tobacco smoke. He has never used smokeless tobacco. He reports that he does not currently use alcohol. He reports that he does not use drugs.  No Known Allergies  Family History  Problem Relation Age of Onset   Colon cancer Father    Diabetes Other        family history   Hypertension Other        family history   Aneurysm Mother        died age 71, brain   Hypertension Mother    Hypertension Sister 41   Hypertension Sister 39   Hypertension Brother 49    Prior to Admission medications   Medication Sig Start Date End Date Taking? Authorizing Provider  amitriptyline (ELAVIL) 25 MG tablet Take by mouth. 02/16/23   [provider]  amLODipine (NORVASC) 10 MG tablet Take 10 mg by mouth daily.    [provider]  aspirin EC 81 MG tablet Take 81 mg by mouth daily.     [provider]  calcitRIOL (ROCALTROL) 0.5 MCG capsule Take 0.5 mcg by mouth 2 (two) times a week.    [provider]  carvedilol (COREG) 12.5 MG tablet Take 12.5-25 mg by mouth See admin instructions. 25 mg twice daily on non treatment days. 12.5-25 mg in the evening after treatment as needed for SBP > 120.    [provider]  ferric citrate (AURYXIA) 1 GM 210 MG(Fe) tablet Take 420 mg by mouth 3 (three) times daily with meals.    [provider]  heparin sodium, porcine, 1000 UNIT/ML injection Inject 2,000 Units into the vein See admin instructions. 2000 units pre treatment (2 days on, 1 day off) 02/04/20   [provider]  HYDROcodone-acetaminophen (NORCO) 5-325 MG tablet 1-2 tabs po q6 hours prn pain 12/30/22   Betha Loa, MD  losartan (COZAAR) 50 MG  tablet Take 50 mg by mouth daily. 10/02/19   [provider]  Methoxy PEG-Epoetin Beta (MIRCERA IJ) Inject 1 Dose into the skin See admin instructions. Inject dose after every treatment (2 days on, 1 day off) 07/27/21   [provider]  rosuvastatin (CRESTOR) 5 MG tablet Take 5 mg by mouth daily.  08/20/18   [provider]  Semaglutide (OZEMPIC, 0.25 OR 0.5 MG/DOSE, [place]) Inject 0.5 mg into the skin every Friday.    [provider]     Physical Exam: Vitals:   05/23/23 2345 05/24/23 0000 05/24/23 0015 05/24/23 0030  BP: (!) 95/42 (!) 103/58 (!) 108/56 (!) 96/51  Pulse: (!) 126 (!) 121 (!) 119 (!) 117  Resp: (!) 31 (!) 29 (!) 30 (!) 23  SpO2: 97%  96% 94% 95%  Weight:      Height:        Physical Exam Constitutional:      Appearance: He is ill-appearing.  Cardiovascular:     Rate and Rhythm: Tachycardia present. Rhythm irregular.     Heart sounds: Normal heart sounds. No murmur heard. Pulmonary:     Effort: Pulmonary effort is normal. No tachypnea.     Breath sounds: No decreased breath sounds, wheezing, rhonchi or rales.  Abdominal:     General: Bowel sounds are normal.     Palpations: Abdomen is soft.  Musculoskeletal:        General: Normal range of motion.     Cervical back: Neck supple.  Skin:    Capillary Refill: Capillary refill takes less than 2 seconds.  Neurological:     Mental Status: He is oriented to person, place, and time.  Psychiatric:        Mood and Affect: Mood normal.      Labs on Admission: I have personally reviewed following labs and imaging studies  CBC: Recent Labs  Lab 05/23/23 2044 05/23/23 2155  WBC 0.6* 1.6*  NEUTROABS  --  1.3*  HGB 10.8* 9.6*  HCT 33.2* 29.4*  MCV 84.1 84.2  PLT 212 182   Basic Metabolic Panel: Recent Labs  Lab 05/23/23 2044  NA 139  K 4.8  CL 95*  CO2 24  GLUCOSE 142*  BUN 54*  CREATININE 10.40*  CALCIUM 9.4   GFR: Estimated Creatinine Clearance: 11 mL/min (A) (by C-G  formula based on SCr of 10.4 mg/dL (H)). Liver Function Tests: No results for input(s): "AST", "ALT", "ALKPHOS", "BILITOT", "PROT", "ALBUMIN" in the last 168 hours. No results for input(s): "LIPASE", "AMYLASE" in the last 168 hours. No results for input(s): "AMMONIA" in the last 168 hours. Coagulation Profile: Recent Labs  Lab 05/23/23 2052  INR 1.2   Cardiac Enzymes: Recent Labs  Lab 05/23/23 2044  TROPONINIHS 10   BNP (last 3 results) Recent Labs    05/23/23 2044  BNP 25.9   HbA1C: No results for input(s): "HGBA1C" in the last 72 hours. CBG: Recent Labs  Lab 05/23/23 2037  GLUCAP 141*   Lipid Profile: No results for input(s): "CHOL", "HDL", "LDLCALC", "TRIG", "CHOLHDL", "LDLDIRECT" in the last 72 hours. Thyroid Function Tests: No results for input(s): "TSH", "T4TOTAL", "FREET4", "T3FREE", "THYROIDAB" in the last 72 hours. Anemia Panel: No results for input(s): "VITAMINB12", "FOLATE", "FERRITIN", "TIBC", "IRON", "RETICCTPCT" in the last 72 hours. Urine analysis:    Component Value Date/Time   COLORURINE YELLOW 05/25/2021 1106   APPEARANCEUR HAZY (A) 05/25/2021 1106   LABSPEC 1.005 05/25/2021 1106   PHURINE 6.0 05/25/2021 1106   GLUCOSEU 50 (A) 05/25/2021 1106   HGBUR MODERATE (A) 05/25/2021 1106   BILIRUBINUR NEGATIVE 05/25/2021 1106   BILIRUBINUR negative 05/25/2021 1001   KETONESUR NEGATIVE 05/25/2021 1106   KETONESUR negative 05/25/2021 1001   PROTEINUR 100 (A) 05/25/2021 1106   PROTEINUR =100 (A) 05/25/2021 1001   UROBILINOGEN 0.2 05/25/2021 1001   NITRITE NEGATIVE 05/25/2021 1106   NITRITE Negative 05/25/2021 1001   LEUKOCYTESUR LARGE (A) 05/25/2021 1106   LEUKOCYTESUR Trace (A) 05/25/2021 1001    Radiological Exams on Admission: I have personally reviewed images CT L-SPINE NO CHARGE  Result Date: 05/24/2023 CLINICAL DATA:  Insert Osteomyelitis, lumbar Lumbar 4 to lumbar 5 vertebra septic arthritis EXAM: CT LUMBAR SPINE WITHOUT CONTRAST TECHNIQUE:  Multidetector CT imaging of the lumbar spine was performed without intravenous contrast  administration. Multiplanar CT image reconstructions were also generated. RADIATION DOSE REDUCTION: This exam was performed according to the departmental dose-optimization program which includes automated exposure control, adjustment of the mA and/or kV according to patient size and/or use of iterative reconstruction technique. COMPARISON:  CT abdomen and pelvis today and 10/21/2022. FINDINGS: Segmentation: 5 lumbar type vertebrae. Alignment: Normal Vertebrae: No acute fracture or focal pathologic process. Paraspinal and other soft tissues: Normal Disc levels: Disc spaces maintained. Mild right facet arthropathy with erosions on the right at L4-5 as seen on earlier study. This is stable when compared to prior CT from February of this year. This is most compatible with facet arthritis. No evidence of infection. IMPRESSION: No acute bony abnormality. No evidence of discitis or osteomyelitis by CT. Degenerative right facet arthropathy at L4-5 unchanged since prior study. Electronically Signed   By: Charlett Nose M.D.   On: 05/24/2023 00:44   CT ABDOMEN PELVIS WO CONTRAST  Result Date: 05/23/2023 CLINICAL DATA:  Sepsis.  Lower back pain. EXAM: CT ABDOMEN AND PELVIS WITHOUT CONTRAST TECHNIQUE: Multidetector CT imaging of the abdomen and pelvis was performed following the standard protocol without IV contrast. RADIATION DOSE REDUCTION: This exam was performed according to the departmental dose-optimization program which includes automated exposure control, adjustment of the mA and/or kV according to patient size and/or use of iterative reconstruction technique. COMPARISON:  MRI abdomen 10/22/2022 and CT abdomen and pelvis 10/21/2022 FINDINGS: Lower chest: Multiple new pulmonary nodules are present in the lung bases. For example in the left lower lobe posteriorly on series 3/image 17 measuring 7 mm. Additional nodules are present in  the right lower lobe on 3/10 and 3/7 and right middle lobe on 3/5. There is mild ground-glass opacity about each nodule. Hepatobiliary: Cholecystectomy. Unremarkable noncontrast appearance of the liver. No biliary dilation. Pancreas: Unremarkable. Spleen: Unremarkable. Adrenals/Urinary Tract: Stable adrenal glands. No urinary calculi or hydronephrosis. Nondistended thick-walled bladder. Stomach/Bowel: Normal caliber large and small bowel. No bowel wall thickening. The appendix is normal.Stomach is within normal limits. Vascular/Lymphatic: Aortic atherosclerosis. No enlarged abdominal or pelvic lymph nodes. Reproductive: Unremarkable. Other: No free intraperitoneal fluid or air. Musculoskeletal: No acute fracture. Right L4-L5 facet arthritis with mild erosive change. IMPRESSION: 1. Right L4-L5 facet joint arthritis with mild erosive change. Correlation with site of pain is recommended. If there is concern for septic arthritis, MRI is recommended. 2. Multiple pulmonary nodules are present bilaterally measuring up to 7 mm with surrounding ground-glass opacity. This is nonspecific and can be seen in a variety of infectious, neoplastic, and inflammatory conditions including septic emboli. Consider dedicated CT chest for further evaluation. 3. Nondistended thick-walled bladder. Correlate with urinalysis. Aortic Atherosclerosis (ICD10-I70.0). Electronically Signed   By: Minerva Fester M.D.   On: 05/23/2023 23:20   DG Chest Port 1 View  Result Date: 05/23/2023 CLINICAL DATA:  Questionable sepsis - evaluate for abnormality EXAM: PORTABLE CHEST - 1 VIEW COMPARISON:  11/19/2013 FINDINGS: Low lung volumes. Mild perihilar and bibasilar interstitial prominence. No confluent airspace disease. Heart size and mediastinal contours are within normal limits. No effusion. Visualized bones unremarkable. IMPRESSION: Low lung volumes with mild perihilar and bibasilar interstitial prominence. Electronically Signed   By: Corlis Leak M.D.    On: 05/23/2023 21:32    EKG: My personal interpretation of EKG shows: Atrial fibrillation RVR with 1 blockage.  Heart rate 142.    Assessment/Plan: Principal Problem:   Septic shock (HCC) Active Problems:   ESRD (end stage renal disease) (HCC)   Essential hypertension  Leukopenia   Paroxysmal atrial fibrillation (HCC)    Assessment and Plan: Septic shock-unknown source of infection at this time -Patient presenting with complaining of back pain, fever and chill at home today.  Denies any chest pain, shortness of breath and palpitation.  Denies any cough and no sputum production. - At initial presentation to ED patient found to be hypotensive 98/41, heart rate 140, respiratory rate 30 and O2 sat 98% on 5 L. -EKG showed atrial fibrillation RVR. - CBC showed low WBC count 0.06 and repeat check showed improvement of WBC count to 1.6, hemoglobin 9.6, hematocrit 29 and platelet count 182. -Elevated lactic acid level 6.6. -Chest x-ray showed low lung volumes with mild perihilar and basilar interstitial prominence - CT abdomen pelvis showed L4-L5 facet joint arthritis with mild erosive change.  Concern for septic arthritis. Also multiple pulmonary nodules are present bilaterally measuring up to 7 mm of surrounding groundglass opacity.  This finding could be infectious/neoplastic/inflammatory changes including septic emboli. -In the ED patient has been resuscitated with LR 1 L of bolus, vancomycin, and metronidazole IV once. - Patient continues to remain tachycardic and hypotensive.  Concern for septic shock unclear source of infection at this time. - Checking CT scan of the chest and CT lumbar spine. - Spoke with on-call nephrology Dr. Ronalee Belts who recommended to avoid any further IV fluid as he is a dialysis patient.  Will see patient in the morning for need for dialysis. -Plan to continue broad-spectrum antibiotic coverage with Vanco and cefepime. - Pending blood culture and sputum  culture. - Given patient is continues to deteriorating and and most recent MAP is 63 consulted to ICU provider Dr. Revonda Standard will evaluate patient soon. Addendum: -ICU has been evaluated patient.  ICU will give patient 1 L of fluid and plan to continue maintenance fluid as well given patient has elevated lactic acid 6.  ICU provider ordered LR 500 bolus and currently continuing LR 50 cc/h after speaking with nephrology. Addendum: -CT lumbar spine IMPRESSION: No acute bony abnormality. No evidence of discitis or osteomyelitis by CT. Degenerative right facet arthropathy at L4-5 unchanged since prior study.  Leukopenia Neutropenia -Initial CBC showed low WBC 0.06 and repeat WBC 1.6.  Absolute neutrophil count 1.3. -Relative leukopenia in the setting of septic shock. -Continue to treat patient empirically with broad-spectrum antibiotic and monitor CBC.   ESRD on HD -ESRD on home dialysis 4 times weekly at home.  Patient reported doing dialysis 2 days in a row and 1 day break. -Consulted on-call nephrology Dr. Ronalee Belts.  Possible dialysis in the morning.  Paroxysmal atrial fibrillation -Initial EKG showed atrial fibrillation with RVR with 2:1 AV block.  Heart rate up to 142.  At bedside cardiac monitoring showing heart rate has been improved to 122 after patient received 1 L of bolus in the ED.  Currently on sinus tachycardia - Concern for paroxysmal atrial fibrillation in the setting of septic shock.  Cannot give further fluid given patient is ESRD patient as well. -CT abdomen showed mild pulmonary nodule which could be septic emboli as well. -Obtaining echocardiogram -Continue cardiac monitoring   Essential hypertension -At home patient is on amlodipine 10 mg, Coreg 12.5 mg and losartan 50 mg.  Holding blood pressure regimen in the setting of septic shock.  Hyperlipidemia - Continue Crestor 5 mg daily  DVT prophylaxis:  SQ Heparin Code Status:  Full Code Diet: Renal diet with fluid  restriction 1200 cc Family Communication: Discussed treatment plan with patient wife at  the bedside. Disposition Plan:   Consults: Nephrology and ICU Admission status:   Inpatient, progressive unit  Severity of Illness: The appropriate patient status for this patient is INPATIENT. Inpatient status is judged to be reasonable and necessary in order to provide the required intensity of service to ensure the patient's safety. The patient's presenting symptoms, physical exam findings, and initial radiographic and laboratory data in the context of their chronic comorbidities is felt to place them at high risk for further clinical deterioration. Furthermore, it is not anticipated that the patient will be medically stable for discharge from the hospital within 2 midnights of admission.   * I certify that at the point of admission it is my clinical judgment that the patient will require inpatient hospital care spanning beyond 2 midnights from the point of admission due to high intensity of service, high risk for further deterioration and high frequency of surveillance required.Marland Kitchen    Tereasa Coop, MD [facility]  How to contact the [facility] Attending or Consulting provider 7A - 7P or covering provider during after hours 7P -7A, for this patient.  Check the care team in [facility] and look for a) attending/consulting [facility] provider listed and b) the [facility] team listed Log into [email or web address] and use [place]'s universal password to access. If you do not have the password, please contact the hospital operator. Locate the [facility] provider you are looking for under [facility] and page to a number that you can be directly reached. If you still have difficulty reaching the provider, please page the [facility] (Director on Call) for the Hospitalists listed on amion for assistance.  05/24/2023, 12:47 AM

## 2023-05-23 NOTE — ED Provider Notes (Signed)
[place] [facility]ERGENCY DEPARTMENT AT [facility] Provider Note   [name]: [id number] Arrival date & time: 05/23/23  2014     History {Add pertinent medical, surgical, social history, OB history to HPI:1} Chief Complaint  Patient presents with   Chest Pain    Ray Bradley is a 52 y.o. male.   Chest Pain  Patient reports that shortly prior to ED presentation he was doing his home dialysis when he had acute onset of chest pain with radiation into his jaw.  He reports that while he was trying to run his dialysis he was getting continuous error messages and found that it was not running through the diastole.  He reports he has had ongoing nausea, vomiting.  His chest pain has begun to improve.  Denies significant shortness of breath.  Denies recent sick contacts, fever.    Home Medications Prior to Admission medications   Medication Sig Start Date End Date Taking? Authorizing Provider  amitriptyline (ELAVIL) 25 MG tablet Take by mouth. 02/16/23   [provider]  amLODipine (NORVASC) 10 MG tablet Take 10 mg by mouth daily.    [provider]  aspirin EC 81 MG tablet Take 81 mg by mouth daily.     [provider]  calcitRIOL (ROCALTROL) 0.5 MCG capsule Take 0.5 mcg by mouth 2 (two) times a week.    [provider]  carvedilol (COREG) 12.5 MG tablet Take 12.5-25 mg by mouth See admin instructions. 25 mg twice daily on non treatment days. 12.5-25 mg in the evening after treatment as needed for SBP > 120.    [provider]  ferric citrate (AURYXIA) 1 GM 210 MG(Fe) tablet Take 420 mg by mouth 3 (three) times daily with meals.    [provider]  heparin sodium, porcine, 1000 UNIT/ML injection Inject 2,000 Units into the vein See admin instructions. 2000 units pre treatment (2 days on, 1 day off) 02/04/20   [provider]  HYDROcodone-acetaminophen (NORCO) 5-325 MG tablet 1-2 tabs po q6 hours prn pain 12/30/22   Betha Loa,  MD  losartan (COZAAR) 50 MG tablet Take 50 mg by mouth daily. 10/02/19   [provider]  Methoxy PEG-Epoetin Beta (MIRCERA IJ) Inject 1 Dose into the skin See admin instructions. Inject dose after every treatment (2 days on, 1 day off) 07/27/21   [provider]  rosuvastatin (CRESTOR) 5 MG tablet Take 5 mg by mouth daily.  08/20/18   [provider]  Semaglutide (OZEMPIC, 0.25 OR 0.5 MG/DOSE, [place]) Inject 0.5 mg into the skin every Friday.    [provider]      Allergies    Patient has no known allergies.    Review of Systems   Review of Systems  Cardiovascular:  Positive for chest pain.    Physical Exam Updated Vital Signs Ht 5\' 10"  (1.778 m)   Wt 119.5 kg   BMI 37.80 kg/m  Physical Exam Vitals and nursing note reviewed.  Constitutional:      General: He is in acute distress.     Appearance: He is ill-appearing.  HENT:     Head: Normocephalic and atraumatic.     Mouth/Throat:     Mouth: Mucous membranes are dry.  Eyes:     Conjunctiva/sclera: Conjunctivae normal.  Cardiovascular:     Rate and Rhythm: Regular rhythm. Tachycardia present.     Heart sounds: No murmur heard. Pulmonary:     Effort: Pulmonary effort is normal. No respiratory  distress.     Breath sounds: Normal breath sounds. No decreased breath sounds, wheezing, rhonchi or rales.  Abdominal:     Palpations: Abdomen is soft.     Tenderness: There is no abdominal tenderness.  Musculoskeletal:        General: No swelling.     Cervical back: Neck supple.  Skin:    General: Skin is warm and dry.     Capillary Refill: Capillary refill takes less than 2 seconds.  Neurological:     Mental Status: He is alert.     ED Results / Procedures / Treatments   Labs (all labs ordered are listed, but only abnormal results are displayed) Labs Reviewed  BASIC METABOLIC PANEL - Abnormal; Notable for the following components:      Result Value   Chloride 95 (*)    Glucose, Bld 142 (*)     BUN 54 (*)    Creatinine, Ser 10.40 (*)    GFR, Estimated 6 (*)    Anion gap 20 (*)    All other components within normal limits  CBC - Abnormal; Notable for the following components:   WBC 0.6 (*)    RBC 3.95 (*)    Hemoglobin 10.8 (*)    HCT 33.2 (*)    nRBC 3.1 (*)    All other components within normal limits  PROTIME-INR - Abnormal; Notable for the following components:   Prothrombin Time 15.3 (*)    All other components within normal limits  CBC WITH DIFFERENTIAL/PLATELET - Abnormal; Notable for the following components:   WBC 1.6 (*)    RBC 3.49 (*)    Hemoglobin 9.6 (*)    HCT 29.4 (*)    nRBC 1.9 (*)    Neutro Abs 1.3 (*)    Lymphs Abs 0.3 (*)    Monocytes Absolute 0.0 (*)    All other components within normal limits  CBG MONITORING, ED - Abnormal; Notable for the following components:   Glucose-Capillary 141 (*)    All other components within normal limits  RESP PANEL BY RT-PCR (RSV, FLU A&B, COVID)  RVPGX2  CULTURE, BLOOD (ROUTINE X 2)  CULTURE, BLOOD (ROUTINE X 2)  APTT  BRAIN NATRIURETIC PEPTIDE  CBC WITH DIFFERENTIAL/PLATELET  URINALYSIS, W/ REFLEX TO CULTURE (INFECTION SUSPECTED)  CBC WITH DIFFERENTIAL/PLATELET  COMPREHENSIVE METABOLIC PANEL  I-STAT CG4 LACTIC ACID, ED  I-STAT CG4 LACTIC ACID, ED  TROPONIN I (HIGH SENSITIVITY)  TROPONIN I (HIGH SENSITIVITY)  TROPONIN I (HIGH SENSITIVITY)  TROPONIN I (HIGH SENSITIVITY)    EKG EKG Interpretation Date/Time:  Monday May 23 2023 20:26:47 EDT Ventricular Rate:  142 PR Interval:    QRS Duration:  95 QT Interval:  349 QTC Calculation: 537 R Axis:   49  Text Interpretation: Atrial flutter with predominant 2:1 AV block Nonspecific repol abnormality, diffuse leads Prolonged QT interval Lateral ST Depressions, possibly rate related Confirmed by Anders Simmonds [phone]) on 05/23/2023 8:35:23 PM  Radiology DG Chest Port 1 View  Result Date: 05/23/2023 CLINICAL DATA:  Questionable sepsis - evaluate for  abnormality EXAM: PORTABLE CHEST - 1 VIEW COMPARISON:  11/19/2013 FINDINGS: Low lung volumes. Mild perihilar and bibasilar interstitial prominence. No confluent airspace disease. Heart size and mediastinal contours are within normal limits. No effusion. Visualized bones unremarkable. IMPRESSION: Low lung volumes with mild perihilar and bibasilar interstitial prominence. Electronically Signed   By: Corlis Leak M.D.   On: 05/23/2023 21:32    Procedures Procedures  {Document cardiac monitor, telemetry assessment procedure  when appropriate:1}  Medications Ordered in ED Medications  lactated ringers infusion (has no administration in time range)  vancomycin (VANCOCIN) IVPB 1000 mg/200 mL premix (1,000 mg Intravenous New Bag/Given 05/23/23 2155)    Followed by  vancomycin (VANCOCIN) IVPB 1000 mg/200 mL premix (has no administration in time range)  ceFEPIme (MAXIPIME) 2 g in sodium chloride 0.9 % 100 mL IVPB (0 g Intravenous Stopped 05/23/23 2249)  metroNIDAZOLE (FLAGYL) IVPB 500 mg (0 mg Intravenous Stopped 05/23/23 2249)  acetaminophen (TYLENOL) tablet 1,000 mg (1,000 mg Oral Given 05/23/23 2217)  lactated ringers bolus 1,000 mL (1,000 mLs Intravenous New Bag/Given 05/23/23 2234)    ED Course/ Medical Decision Making/ A&P   {   Click here for ABCD2, HEART and other calculatorsREFRESH Note before signing :1}                              Medical Decision Making Amount and/or Complexity of Data Reviewed Labs: ordered. Radiology: ordered. ECG/medicine tests: ordered.  Risk OTC drugs. Prescription drug management.   Patient is a 52 year old male with a history of hypertension, diabetes, ESRD on HD, hyperlipidemia presenting for chest pain.  On my initial evaluation, he is tachycardic, maintaining stable blood pressure, febrile.  Reports acute onset of symptoms following malfunction of his home dialysis machine.  On exam, mucous membranes are mildly dry, breath sounds are clear to auscultation, he does  not have evidence of increased work of breathing.  He has 2+ pulses in all 4 distal extremities.  Given fever and tachycardia, concern for sepsis and full sepsis workup sent.  Additional concern for potential arrhythmia, EKG obtained.  Will obtain troponins to evaluate for ACS or secondary heart strain.  Given his known dialysis, concerned that septic source may be bacteremia.  Will additionally obtain CT to evaluate for intra-abdominal source.  He does produce a small amount of urine and therefore will send UA as well.  Given concern for sepsis, initiated broad-spectrum antibiotic coverage with vancomycin, cefepime, Flagyl.  Point-of-care ultrasound demonstrates mildly collapsible IVC.  As he is a dialysis patient, will not pursue full 30 cc/kg fluid resuscitation given risk of volume overload.  Initiated 1 L IV fluid for initial resuscitation.  Results reviewed.  CBC with leukocytopenia to 0.6, confirmatory sample sent.  Anemia present with hemoglobin 10.8, similar compared to prior.  BMP with overall normal electrolytes, creatinine elevated however similar when compared to baseline.  Anion gap present to 20.  Chest x-ray demonstrates low lung volumes but no other acute process.  Given concern for sepsis with abnormal vital signs, feel patient would benefit from admission for further management.  Handed off to oncoming team with plan for admission following completion of workup.  {Document critical care time when appropriate:1} {Document review of labs and clinical decision tools ie heart score, Chads2Vasc2 etc:1}  {Document your independent review of radiology images, and any outside records:1} {Document your discussion with family members, caretakers, and with consultants:1} {Document social determinants of health affecting pt's care:1} {Document your decision making why or why not admission, treatments were needed:1} Final Clinical Impression(s) / ED Diagnoses Final diagnoses:  Fever,  unspecified fever cause    Rx / DC Orders ED Discharge Orders     None

## 2023-05-23 NOTE — Sepsis Progress Note (Signed)
[facility] following for Sepsis Protocol 

## 2023-05-23 NOTE — Progress Notes (Signed)
ED Pharmacy Antibiotic Sign Off An antibiotic consult was received from an ED provider for cefepime and vancomycin per pharmacy dosing for sepsis of unknown source. A chart review was completed to assess appropriateness.   The following one time order(s) were placed:  Cefepime 2g Vancomycin 2g  Further antibiotic and/or antibiotic pharmacy consults should be ordered by the admitting provider if indicated.   Thank you for allowing pharmacy to be a part of this patient's care.   Marja Kays, [facility]  Clinical Pharmacist 05/23/23 9:02 PM

## 2023-05-23 NOTE — ED Triage Notes (Signed)
Pt BIBEMS from home w/ c/o chest pain lower back & jaw pain. As per report sx started during dialysis at home. N/V & coughing on arrival in ER.

## 2023-05-23 NOTE — ED Provider Notes (Signed)
  Physical Exam  BP (!) 102/55   Pulse (!) 144   Ht 5\' 10"  (1.778 m)   Wt 119.5 kg   SpO2 98%   BMI 37.80 kg/m   Physical Exam  Procedures  Procedures  ED Course / MDM    Medical Decision Making Amount and/or Complexity of Data Reviewed Labs: ordered. Radiology: ordered. ECG/medicine tests: ordered.  Risk OTC drugs. Prescription drug management. Decision regarding hospitalization.   Patient care assumed from [facility] at shift handoff.  In short, 52 year old male patient with history of home dialysis was performing home dialysis earlier today, with some complications, began to have acute onset chest pain with radiation into the jaw with coinciding nausea and vomiting.  He denies shortness of breath, recent sick contacts, fever.  His chest pain improved during ED visit.  At time of my assumption of care patient had been started on broad-spectrum antibiotics and given a judicial fluid bolus as patient met SIRS criteria.  Unclear etiology of patient's sepsis at this time.  Hospitalist service has been paged.  At time of my assumption of care awaiting return call from hospitalist for likely admission.  Discussed case with Dr.Sundil who agrees to see the patient for admission       Pamala Duffel 05/23/23 2354    Dione Booze, MD 05/24/23 [phone]

## 2023-05-24 ENCOUNTER — Encounter (HOSPITAL_COMMUNITY): Payer: Self-pay | Admitting: Internal Medicine

## 2023-05-24 ENCOUNTER — Inpatient Hospital Stay (HOSPITAL_COMMUNITY): Payer: Medicare Other

## 2023-05-24 DIAGNOSIS — R571 Hypovolemic shock: Secondary | ICD-10-CM | POA: Diagnosis present

## 2023-05-24 DIAGNOSIS — I4891 Unspecified atrial fibrillation: Secondary | ICD-10-CM

## 2023-05-24 DIAGNOSIS — E785 Hyperlipidemia, unspecified: Secondary | ICD-10-CM | POA: Diagnosis not present

## 2023-05-24 DIAGNOSIS — Z992 Dependence on renal dialysis: Secondary | ICD-10-CM | POA: Diagnosis not present

## 2023-05-24 DIAGNOSIS — A4153 Sepsis due to Serratia: Secondary | ICD-10-CM | POA: Diagnosis present

## 2023-05-24 DIAGNOSIS — E782 Mixed hyperlipidemia: Secondary | ICD-10-CM | POA: Diagnosis present

## 2023-05-24 DIAGNOSIS — E1140 Type 2 diabetes mellitus with diabetic neuropathy, unspecified: Secondary | ICD-10-CM | POA: Diagnosis present

## 2023-05-24 DIAGNOSIS — I120 Hypertensive chronic kidney disease with stage 5 chronic kidney disease or end stage renal disease: Secondary | ICD-10-CM | POA: Diagnosis present

## 2023-05-24 DIAGNOSIS — D72819 Decreased white blood cell count, unspecified: Secondary | ICD-10-CM

## 2023-05-24 DIAGNOSIS — E1122 Type 2 diabetes mellitus with diabetic chronic kidney disease: Secondary | ICD-10-CM | POA: Diagnosis present

## 2023-05-24 DIAGNOSIS — I480 Paroxysmal atrial fibrillation: Secondary | ICD-10-CM | POA: Diagnosis present

## 2023-05-24 DIAGNOSIS — I4892 Unspecified atrial flutter: Secondary | ICD-10-CM | POA: Diagnosis present

## 2023-05-24 DIAGNOSIS — D6959 Other secondary thrombocytopenia: Secondary | ICD-10-CM | POA: Diagnosis present

## 2023-05-24 DIAGNOSIS — A419 Sepsis, unspecified organism: Secondary | ICD-10-CM | POA: Diagnosis present

## 2023-05-24 DIAGNOSIS — D709 Neutropenia, unspecified: Secondary | ICD-10-CM | POA: Diagnosis present

## 2023-05-24 DIAGNOSIS — N186 End stage renal disease: Secondary | ICD-10-CM | POA: Diagnosis present

## 2023-05-24 DIAGNOSIS — E1165 Type 2 diabetes mellitus with hyperglycemia: Secondary | ICD-10-CM | POA: Diagnosis present

## 2023-05-24 DIAGNOSIS — I441 Atrioventricular block, second degree: Secondary | ICD-10-CM | POA: Diagnosis present

## 2023-05-24 DIAGNOSIS — D631 Anemia in chronic kidney disease: Secondary | ICD-10-CM | POA: Diagnosis present

## 2023-05-24 DIAGNOSIS — E872 Acidosis, unspecified: Secondary | ICD-10-CM | POA: Diagnosis present

## 2023-05-24 DIAGNOSIS — E669 Obesity, unspecified: Secondary | ICD-10-CM | POA: Diagnosis present

## 2023-05-24 DIAGNOSIS — I10 Essential (primary) hypertension: Secondary | ICD-10-CM | POA: Diagnosis not present

## 2023-05-24 DIAGNOSIS — E861 Hypovolemia: Secondary | ICD-10-CM | POA: Diagnosis present

## 2023-05-24 DIAGNOSIS — N2581 Secondary hyperparathyroidism of renal origin: Secondary | ICD-10-CM | POA: Diagnosis present

## 2023-05-24 DIAGNOSIS — J690 Pneumonitis due to inhalation of food and vomit: Secondary | ICD-10-CM | POA: Diagnosis present

## 2023-05-24 DIAGNOSIS — J45909 Unspecified asthma, uncomplicated: Secondary | ICD-10-CM | POA: Diagnosis present

## 2023-05-24 DIAGNOSIS — R6521 Severe sepsis with septic shock: Secondary | ICD-10-CM | POA: Diagnosis present

## 2023-05-24 DIAGNOSIS — Z1152 Encounter for screening for COVID-19: Secondary | ICD-10-CM | POA: Diagnosis not present

## 2023-05-24 LAB — COMPREHENSIVE METABOLIC PANEL
ALT: 147 U/L — ABNORMAL HIGH (ref 0–44)
ALT: 152 U/L — ABNORMAL HIGH (ref 0–44)
AST: 158 U/L — ABNORMAL HIGH (ref 15–41)
AST: 121 U/L — ABNORMAL HIGH (ref 15–41)
Albumin: 2.9 g/dL — ABNORMAL LOW (ref 3.5–5.0)
Albumin: 2.9 g/dL — ABNORMAL LOW (ref 3.5–5.0)
Alkaline Phosphatase: 124 U/L (ref 38–126)
Alkaline Phosphatase: 121 U/L (ref 38–126)
Anion gap: 19 — ABNORMAL HIGH (ref 5–15)
Anion gap: 20 — ABNORMAL HIGH (ref 5–15)
BUN: 58 mg/dL — ABNORMAL HIGH (ref 6–20)
BUN: 71 mg/dL — ABNORMAL HIGH (ref 6–20)
CO2: 20 mmol/L — ABNORMAL LOW (ref 22–32)
CO2: 19 mmol/L — ABNORMAL LOW (ref 22–32)
Calcium: 8.4 mg/dL — ABNORMAL LOW (ref 8.9–10.3)
Calcium: 8 mg/dL — ABNORMAL LOW (ref 8.9–10.3)
Chloride: 97 mmol/L — ABNORMAL LOW (ref 98–111)
Chloride: 96 mmol/L — ABNORMAL LOW (ref 98–111)
Creatinine, Ser: 11.74 mg/dL — ABNORMAL HIGH (ref 0.61–1.24)
Creatinine, Ser: 12.66 mg/dL — ABNORMAL HIGH (ref 0.61–1.24)
GFR, Estimated: 5 mL/min — ABNORMAL LOW (ref >60)
GFR, Estimated: 4 mL/min — ABNORMAL LOW (ref >60)
Glucose, Bld: 226 mg/dL — ABNORMAL HIGH (ref 70–99)
Glucose, Bld: 146 mg/dL — ABNORMAL HIGH (ref 70–99)
Potassium: 3.4 mmol/L — ABNORMAL LOW (ref 3.5–5.1)
Potassium: 3.9 mmol/L (ref 3.5–5.1)
Sodium: 136 mmol/L (ref 135–145)
Sodium: 135 mmol/L (ref 135–145)
Total Bilirubin: 1.8 mg/dL — ABNORMAL HIGH (ref 0.3–1.2)
Total Bilirubin: 2.6 mg/dL — ABNORMAL HIGH (ref 0.3–1.2)
Total Protein: 6.2 g/dL — ABNORMAL LOW (ref 6.5–8.1)
Total Protein: 6.5 g/dL (ref 6.5–8.1)

## 2023-05-24 LAB — BLOOD CULTURE ID PANEL (REFLEXED) - BCID2

## 2023-05-24 LAB — C DIFFICILE QUICK SCREEN W PCR REFLEX
C Diff antigen: NEGATIVE
C Diff interpretation: NOT DETECTED
C Diff toxin: NEGATIVE

## 2023-05-24 LAB — ECHOCARDIOGRAM COMPLETE
Area-P 1/2: 6.32 cm2
Height: 70 in
S' Lateral: 3.6 cm
Weight: 4215.2 [oz_av]

## 2023-05-24 LAB — HEPATITIS B SURFACE ANTIGEN: Hepatitis B Surface Ag: NONREACTIVE

## 2023-05-24 LAB — CBC
HCT: 29 % — ABNORMAL LOW (ref 39.0–52.0)
Hemoglobin: 9.4 g/dL — ABNORMAL LOW (ref 13.0–17.0)
MCH: 27.7 pg (ref 26.0–34.0)
MCHC: 32.4 g/dL (ref 30.0–36.0)
MCV: 85.5 fL (ref 80.0–100.0)
Platelets: 171 10*3/uL (ref 150–400)
RBC: 3.39 MIL/uL — ABNORMAL LOW (ref 4.22–5.81)
RDW: 15.3 % (ref 11.5–15.5)
WBC: 3.4 10*3/uL — ABNORMAL LOW (ref 4.0–10.5)
nRBC: 1.2 % — ABNORMAL HIGH (ref 0.0–0.2)

## 2023-05-24 LAB — GLUCOSE, CAPILLARY
Glucose-Capillary: 102 mg/dL — ABNORMAL HIGH (ref 70–99)
Glucose-Capillary: 107 mg/dL — ABNORMAL HIGH (ref 70–99)
Glucose-Capillary: 122 mg/dL — ABNORMAL HIGH (ref 70–99)

## 2023-05-24 LAB — I-STAT CG4 LACTIC ACID, ED: Lactic Acid, Venous: 6.6 mmol/L (ref 0.5–1.9)

## 2023-05-24 LAB — LACTIC ACID, PLASMA
Lactic Acid, Venous: 7.6 mmol/L (ref 0.5–1.9)
Lactic Acid, Venous: 5.8 mmol/L (ref 0.5–1.9)
Lactic Acid, Venous: 4.2 mmol/L (ref 0.5–1.9)

## 2023-05-24 LAB — TROPONIN I (HIGH SENSITIVITY)
Troponin I (High Sensitivity): 23 ng/L — ABNORMAL HIGH (ref <18)
Troponin I (High Sensitivity): 42 ng/L — ABNORMAL HIGH (ref <18)

## 2023-05-24 LAB — MRSA NEXT GEN BY PCR, NASAL: MRSA by PCR Next Gen: NOT DETECTED

## 2023-05-24 MED ORDER — ACETAMINOPHEN 650 MG RE SUPP: 650.0000 mg | Freq: Four times a day (QID) | RECTAL | Status: DC | PRN

## 2023-05-24 MED ORDER — VANCOMYCIN HCL 1250 MG/250ML IV SOLN
1250.0000 mg | INTRAVENOUS | Status: DC
  Filled 2023-05-24: qty 250

## 2023-05-24 MED ORDER — LACTATED RINGERS IV SOLN: INTRAVENOUS | Status: DC

## 2023-05-24 MED ORDER — NOREPINEPHRINE 4 MG/250ML-% IV SOLN
2.0000 ug/min | INTRAVENOUS | Status: DC
  Administered 2023-05-25: 4 ug/min via INTRAVENOUS
  Administered 2023-05-25: 2 ug/min via INTRAVENOUS
  Filled 2023-05-24 (×2): qty 250

## 2023-05-24 MED ORDER — METOPROLOL TARTRATE 5 MG/5ML IV SOLN: 2.5000 mg | Freq: Once | INTRAVENOUS | Status: DC

## 2023-05-24 MED ORDER — LACTATED RINGERS IV BOLUS
500.0000 mL | Freq: Once | INTRAVENOUS | Status: AC
  Administered 2023-05-24: 500 mL via INTRAVENOUS

## 2023-05-24 MED ORDER — SODIUM CHLORIDE 0.9 % IV SOLN
1.0000 g | INTRAVENOUS | Status: DC
  Filled 2023-05-24: qty 10

## 2023-05-24 MED ORDER — SENNOSIDES-DOCUSATE SODIUM 8.6-50 MG PO TABS: 1.0000 | ORAL_TABLET | Freq: Every evening | ORAL | Status: DC | PRN

## 2023-05-24 MED ORDER — OXYCODONE-ACETAMINOPHEN 5-325 MG PO TABS
1.0000 | ORAL_TABLET | Freq: Four times a day (QID) | ORAL | Status: DC | PRN
  Administered 2023-05-24 – 2023-05-25 (×2): 1 via ORAL
  Filled 2023-05-24 (×2): qty 1

## 2023-05-24 MED ORDER — PIPERACILLIN-TAZOBACTAM IN DEX 2-0.25 GM/50ML IV SOLN
2.2500 g | Freq: Three times a day (TID) | INTRAVENOUS | Status: DC
  Administered 2023-05-24 – 2023-05-25 (×2): 2.25 g via INTRAVENOUS
  Filled 2023-05-24 (×3): qty 50

## 2023-05-24 MED ORDER — HEPARIN (PORCINE) 25000 UT/250ML-% IV SOLN: 1300.0000 [IU]/h | INTRAVENOUS | Status: DC

## 2023-05-24 MED ORDER — HEPARIN SODIUM (PORCINE) 5000 UNIT/ML IJ SOLN: 5000.0000 [IU] | Freq: Three times a day (TID) | INTRAMUSCULAR | Status: DC

## 2023-05-24 MED ORDER — SODIUM CHLORIDE 0.9 % IV SOLN
250.0000 mL | INTRAVENOUS | Status: DC
  Administered 2023-05-24 – 2023-05-26 (×3): 250 mL via INTRAVENOUS

## 2023-05-24 MED ORDER — ORAL CARE MOUTH RINSE: 15.0000 mL | OROMUCOSAL | Status: DC | PRN

## 2023-05-24 MED ORDER — HEPARIN SODIUM (PORCINE) 5000 UNIT/ML IJ SOLN
5000.0000 [IU] | Freq: Three times a day (TID) | INTRAMUSCULAR | Status: DC
  Administered 2023-05-24 – 2023-05-28 (×8): 5000 [IU] via SUBCUTANEOUS
  Filled 2023-05-24 (×9): qty 1

## 2023-05-24 MED ORDER — ROSUVASTATIN CALCIUM 5 MG PO TABS
5.0000 mg | ORAL_TABLET | Freq: Every day | ORAL | Status: DC
  Administered 2023-05-24: 5 mg via ORAL
  Filled 2023-05-24: qty 1

## 2023-05-24 MED ORDER — HEPARIN BOLUS VIA INFUSION
3000.0000 [IU] | Freq: Once | INTRAVENOUS | Status: DC
  Filled 2023-05-24: qty 3000

## 2023-05-24 MED ORDER — LACTATED RINGERS IV BOLUS: 1000.0000 mL | Freq: Once | INTRAVENOUS | Status: DC

## 2023-05-24 MED ORDER — VANCOMYCIN VARIABLE DOSE PER UNSTABLE RENAL FUNCTION (PHARMACIST DOSING): Status: DC

## 2023-05-24 MED ORDER — INSULIN ASPART 100 UNIT/ML IJ SOLN
1.0000 [IU] | INTRAMUSCULAR | Status: DC
  Administered 2023-05-24: 1 [IU] via SUBCUTANEOUS

## 2023-05-24 MED ORDER — CHLORHEXIDINE GLUCONATE CLOTH 2 % EX PADS
6.0000 | MEDICATED_PAD | Freq: Every day | CUTANEOUS | Status: DC
  Administered 2023-05-24 – 2023-05-28 (×4): 6 via TOPICAL

## 2023-05-24 MED ORDER — ONDANSETRON HCL 4 MG PO TABS: 4.0000 mg | ORAL_TABLET | Freq: Four times a day (QID) | ORAL | Status: DC | PRN

## 2023-05-24 MED ORDER — ONDANSETRON HCL 4 MG/2ML IJ SOLN: 4.0000 mg | Freq: Four times a day (QID) | INTRAMUSCULAR | Status: DC | PRN

## 2023-05-24 MED ORDER — FERRIC CITRATE 1 GM 210 MG(FE) PO TABS
420.0000 mg | ORAL_TABLET | Freq: Three times a day (TID) | ORAL | Status: DC
  Administered 2023-05-24 – 2023-05-28 (×11): 420 mg via ORAL
  Filled 2023-05-24 (×12): qty 2

## 2023-05-24 MED ORDER — SODIUM BICARBONATE 650 MG PO TABS
650.0000 mg | ORAL_TABLET | Freq: Three times a day (TID) | ORAL | Status: AC
  Administered 2023-05-24 – 2023-05-25 (×3): 650 mg via ORAL
  Filled 2023-05-24 (×3): qty 1

## 2023-05-24 MED ORDER — ACETAMINOPHEN 325 MG PO TABS
650.0000 mg | ORAL_TABLET | Freq: Four times a day (QID) | ORAL | Status: DC | PRN
  Administered 2023-05-24 – 2023-05-26 (×3): 650 mg via ORAL
  Filled 2023-05-24 (×3): qty 2

## 2023-05-24 MED ORDER — PERFLUTREN LIPID MICROSPHERE
1.0000 mL | INTRAVENOUS | Status: AC | PRN
  Administered 2023-05-24: 2 mL via INTRAVENOUS

## 2023-05-24 NOTE — Consult Note (Signed)
NAME:  Kile Juett, MRN:  [id number], DOB:  [birth year], LOS: 0 ADMISSION DATE:  05/23/2023, CONSULTATION DATE:  05/24/23 REFERRING MD:  Janalyn Shy CHIEF COMPLAINT:  Chest Pain, chills   History of Present Illness:  Ray Bradley is a 52 y.o. male who has a PMH as below including ESRD on HD at home four times per week. He presented to [facility] ED 9/2 with chest pain. He had hooked himself up to dialysis and apparently there was an issue with the machine with the dialysate not running but his blood was circulating through circuit and he began to have shaking chills followed by chest pain and occasional back pain. Dialysis staff were called and trouble shot the machine and got it working correctly; however, chills persisted so wife stopped the session and called EMS. His last session was two days prior where he completed a normal full session. He had felt fine prior to HD start and in the days preceding this.  In ED, he had nausea and vomited at least once. No feelings of aspiration or coughing. He was also hypotensive with SBP in 90s and he apparently normally has SBP in 120-130 range after dialysis and 140-160 range when not getting dialysis.  He had CT abd/pelv that showed right L4-L5 facet joint arthritis, multiple pulmonary nodules with surrounding GGO, thick walled GB. CT L spine was negative for acute processes.  Given hypotension, PCCM called to assess. He received 1L IVF and BP responded with MAP's in the 85 range.  He currently has no chest pain and nausea is also resolved. He is asking for food and drink. He has dry mucous membranes and it appears he could tolerate more volume; however, nephrology concerned about volume overload and have requested we limit additional fluid bolus to 500cc overnight with reassessment in AM.  He denies any fever though temp in ED was 101. He state "I'm always warm". With exception of chills during HD start, denies recent chills/sweats/myalgias.   Initial lactate  elevated at 6.6 and WBC low at 0.6.  Pertinent  Medical History:  has HTN (hypertension); Diabetes (HCC); Chest pain; CKD (chronic kidney disease) stage 5, GFR less than 15 ml/min (HCC); ESRD (end stage renal disease) (HCC); Allergy, unspecified, initial encounter; Anaphylactic shock, unspecified, initial encounter; Anemia in chronic kidney disease; Coagulation defect, unspecified (HCC); Complication of vascular dialysis catheter; Diarrhea, unspecified; Type 2 diabetes mellitus with diabetic neuropathy, unspecified (HCC); Dependence on renal dialysis (HCC); Hypercalcemia; Hyperkalemia; Iron deficiency anemia, unspecified; Long term (current) use of anticoagulants; Mild protein-calorie malnutrition (HCC); Mixed dyslipidemia; Class 2 obesity in adult; Other abnormal findings in urine; Other disorders of phosphorus metabolism; Fluid overload, unspecified; Hyperlipidemia; 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, subsequent encounter; Other specified diseases of liver; Pain, unspecified; Encounter for immunization; Pruritus, unspecified; 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); Secondary hyperparathyroidism of renal origin (HCC); Shortness of breath; Type 2 diabetes mellitus with unspecified diabetic retinopathy without macular edema (HCC); Underimmunization status; Dysthymia; Hyperglycemia due to type 2 diabetes mellitus (HCC); Hypertensive retinopathy; Personal history of colonic polyps; Symptomatic cholelithiasis; End-stage renal disease on hemodialysis (HCC); Essential hypertension; Dyslipidemia; Type 2 diabetes mellitus with chronic kidney disease, without long-term current use of insulin (HCC); Elevated LFTs; History of laparoscopic cholecystectomy; Septic shock (HCC); Leukopenia; and Paroxysmal atrial fibrillation (HCC) on their problem list.  Significant Hospital Events: Including procedures, antibiotic start and stop dates in addition to other pertinent events    9/3 admit  Interim History / Subjective:  Responded to  fluids, MAP 85 currently. Asking for food and drink.  Objective:  Blood pressure 118/70, pulse (!) 115, resp. rate (!) 29, height 5\' 10"  (1.778 m), weight 119.5 kg, SpO2 99%.        Intake/Output Summary (Last 24 hours) at 05/24/2023 0119 Last data filed at 05/24/2023 0013 Gross per 24 hour  Intake 1600 ml  Output --  Net 1600 ml   Filed Weights   05/23/23 2022  Weight: 119.5 kg    Examination: General: Adult male, resting in bed, in NAD. Neuro: A&O x 3, no deficits. HEENT: [place]/AT. Sclerae anicteric. EOMI. MM dry. Cardiovascular: RRR, no M/R/G.  Lungs: Respirations even and unlabored.  CTA bilaterally, No W/R/R. Abdomen: BS x 4, soft, NT/ND.  Musculoskeletal: No gross deformities, no edema.  Skin: Intact, warm, no rashes.   Labs/imaging personally reviewed:  CT abd/pelv > 9/3 right L4-L5 facet joint arthritis, multiple pulmonary nodules with surrounding GGO, thick walled GB.  CT L spine 9/3 > negative for acute processes.  Assessment & Plan:   Sepsis of unclear etiology - BP responding to fluids. Initial lactate 6.6 with WBC 0.6. ? Septic pulmonary emboli on CT abd/pelv - Additional 500cc bolus LR now, then gentle maintenance. - Agree with empiric abx. - Follow cultures. - Follow CBC. - Will need repeat CT chest after acute issues have been treated to assess for resolution/changes.  Neutropenia - presumed 2/2 sepsis. - Supportive care as above. - Follow CBC.  Lactic acidosis - 2/2 above. - Supportive care as above. - Follow lactate.  ESRD on HD. - Nephrology consulted by primary team, will see in AM for HD.  Chest pain - resolved. Initial EKG A.flutter but currently sinus tach on exam. Trop negative. - Supportive care.   Best practice (evaluated daily):  Per primary team.  Labs   CBC: Recent Labs  Lab 05/23/23 2044 05/23/23 2155  WBC 0.6* 1.6*  NEUTROABS  --  1.3*  HGB 10.8* 9.6*  HCT 33.2*  29.4*  MCV 84.1 84.2  PLT 212 182    Basic Metabolic Panel: Recent Labs  Lab 05/23/23 2044  NA 139  K 4.8  CL 95*  CO2 24  GLUCOSE 142*  BUN 54*  CREATININE 10.40*  CALCIUM 9.4   GFR: Estimated Creatinine Clearance: 11 mL/min (A) (by C-G formula based on SCr of 10.4 mg/dL (H)). Recent Labs  Lab 05/23/23 2044 05/23/23 2155 05/24/23 0027  WBC 0.6* 1.6*  --   LATICACIDVEN  --   --  6.6*    Liver Function Tests: No results for input(s): "AST", "ALT", "ALKPHOS", "BILITOT", "PROT", "ALBUMIN" in the last 168 hours. No results for input(s): "LIPASE", "AMYLASE" in the last 168 hours. No results for input(s): "AMMONIA" in the last 168 hours.  ABG    Component Value Date/Time   HCO3 27.8 (H) 07/03/2007 0036   TCO2 28 10/25/2022 0610     Coagulation Profile: Recent Labs  Lab 05/23/23 2052  INR 1.2    Cardiac Enzymes: No results for input(s): "CKTOTAL", "CKMB", "CKMBINDEX", "TROPONINI" in the last 168 hours.  HbA1C: Hgb A1c MFr Bld  Date/Time Value Ref Range Status  10/22/2022 08:10 AM 7.0 (H) 4.8 - 5.6 % Final    Comment:    (NOTE) Pre diabetes:          5.7%-6.4%  Diabetes:              >6.4%  Glycemic control for   <7.0% adults with diabetes  CBG: Recent Labs  Lab 05/23/23 2037  GLUCAP 141*    Review of Systems:   All negative; except for those that are bolded, which indicate positives.  Constitutional: weight loss, weight gain, night sweats, fevers, chills, fatigue, weakness.  HEENT: headaches, sore throat, sneezing, nasal congestion, post nasal drip, difficulty swallowing, tooth/dental problems, visual complaints, visual changes, ear aches. Neuro: difficulty with speech, weakness, numbness, ataxia. CV:  chest pain (resolved), orthopnea, PND, swelling in lower extremities, dizziness, palpitations, syncope.  Resp: cough, hemoptysis, dyspnea, wheezing. GI: heartburn, indigestion, abdominal pain, nausea, vomiting, diarrhea, constipation, change  in bowel habits, loss of appetite, hematemesis, melena, hematochezia.  GU: dysuria, change in color of urine, urgency or frequency, flank pain, hematuria. MSK: joint pain or swelling, decreased range of motion. Psych: change in mood or affect, depression, anxiety, suicidal ideations, homicidal ideations. Skin: rash, itching, bruising.   Past Medical History:  He,  has a past medical history of Anemia, Asthma, Chest pain, Chronic kidney disease, COVID, History of kidney stones, HTN (hypertension), Hyperlipidemia, Pneumonia, Type II or unspecified type diabetes mellitus without mention of complication, not stated as uncontrolled, and Wears glasses.   Surgical History:   Past Surgical History:  Procedure Laterality Date   AV FISTULA PLACEMENT Left 08/24/2019   Procedure: CREATION OF ARTERIOVENOUS (AV) FISTULA  LEFT ARM;  Surgeon: Cephus Shelling, MD;  Location: [facility] OR;  Service: Vascular;  Laterality: Left;   CARPAL TUNNEL RELEASE Left 12/30/2022   Procedure: LEFT CARPAL TUNNEL RELEASE;  Surgeon: Betha Loa, MD;  Location: [facility] OR;  Service: Orthopedics;  Laterality: Left;  30 MIN   CHOLECYSTECTOMY N/A 10/25/2022   Procedure: LAPAROSCOPIC CHOLECYSTECTOMY;  Surgeon: Diamantina Monks, MD;  Location: [facility] OR;  Service: General;  Laterality: N/A;   CIRCUMCISION, NON-NEWBORN  2004   COLONOSCOPY W/ BIOPSIES AND POLYPECTOMY     EYE SURGERY     FOOT SURGERY     IR FLUORO GUIDE CV LINE RIGHT  07/23/2019   IR FLUORO GUIDE CV LINE RIGHT  09/19/2019   IR US GUIDE VASC ACCESS RIGHT  07/23/2019   MULTIPLE TOOTH EXTRACTIONS       Social History:   reports that he has never smoked. He has been exposed to tobacco smoke. He has never used smokeless tobacco. He reports that he does not currently use alcohol. He reports that he does not use drugs.   Family History:  His family history includes Aneurysm in his mother; Colon cancer in his father; Diabetes in an other family member; Hypertension in his mother  and another family member; Hypertension (age of onset: 63) in his sister; Hypertension (age of onset: 29) in his brother; Hypertension (age of onset: 10) in his sister.   Allergies No Known Allergies   Home Medications  Prior to Admission medications   Medication Sig Start Date End Date Taking? Authorizing Provider  amitriptyline (ELAVIL) 25 MG tablet Take by mouth. 02/16/23   [provider]  amLODipine (NORVASC) 10 MG tablet Take 10 mg by mouth daily.    [provider]  aspirin EC 81 MG tablet Take 81 mg by mouth daily.     [provider]  calcitRIOL (ROCALTROL) 0.5 MCG capsule Take 0.5 mcg by mouth 2 (two) times a week.    [provider]  carvedilol (COREG) 12.5 MG tablet Take 12.5-25 mg by mouth See admin instructions. 25 mg twice daily on non treatment days. 12.5-25 mg in the evening after treatment as needed for SBP >  120.    [provider]  ferric citrate (AURYXIA) 1 GM 210 MG(Fe) tablet Take 420 mg by mouth 3 (three) times daily with meals.    [provider]  heparin sodium, porcine, 1000 UNIT/ML injection Inject 2,000 Units into the vein See admin instructions. 2000 units pre treatment (2 days on, 1 day off) 02/04/20   [provider]  HYDROcodone-acetaminophen (NORCO) 5-325 MG tablet 1-2 tabs po q6 hours prn pain 12/30/22   Betha Loa, MD  losartan (COZAAR) 50 MG tablet Take 50 mg by mouth daily. 10/02/19   [provider]  Methoxy PEG-Epoetin Beta (MIRCERA IJ) Inject 1 Dose into the skin See admin instructions. Inject dose after every treatment (2 days on, 1 day off) 07/27/21   [provider]  rosuvastatin (CRESTOR) 5 MG tablet Take 5 mg by mouth daily.  08/20/18   [provider]  Semaglutide (OZEMPIC, 0.25 OR 0.5 MG/DOSE, [place]) Inject 0.5 mg into the skin every Friday.    [provider]     Critical care time: N/A   Rutherford Guys, PA - [facility]  Medicine For pager details, please see AMION or use Epic chat  After 1900, please call [facility] for cross coverage needs 05/24/2023, 1:19 AM

## 2023-05-24 NOTE — ED Notes (Signed)
Dr. Tyson Babinski notified by phone about pt's hypotensive episodes. Advised to notify nephrology. Awaiting call back from dialysis.

## 2023-05-24 NOTE — ED Notes (Signed)
Pt refuse bedside commode, ambulated to bathroom

## 2023-05-24 NOTE — Progress Notes (Addendum)
Patient seen and examined at bedside. Discussed patient with  Dr. Glenna Fellows, appears mildly dry on exam, had recent episode of diarrhea. Giving additional fluids. Nephro agrees. Follow up progress note to follow.  Steffanie Dunn, DO 05/24/23 6:37 PM [facility]  For contact information, see Amion. If no response to pager, please call PCCM consult pager. After hours, 7PM- 7AM, please call Elink.

## 2023-05-24 NOTE — Progress Notes (Signed)
Pharmacy Antibiotic Note  Ray Bradley is a 52 y.o. male admitted on 05/23/2023 with sepsis with unknown source of infection.  Pharmacy has been consulted for cefepime and vancomycin dosing. Patient presented with acute onset of chestpain after failure to run his dialysis at home. His home dialysis schedule is 4 times weekly at home giving 2 days ad then a 1 day break in between.  Reported to be tachycardic, febrile, WBC 1.6, CT abdomen showed concerns for septic arthritis and multiple nonspecific pulmonary nodules >> septic emboli. CT- L spine showed no signs of osteo/discitis Received one dose of vancomycin 2g IV x1 and cefepime 2g IV x1 in the ED.  Plan: Cefepime 1gm IV every 24 hours Vancomycin 2g IV x1 in ED Vancomcyin variable dosing until new HD schedule is confirmed and then will dose. Monitor plans for HD Follow up signs of clinical improvement, surgical plans, LOT, de-escalation of antibiotics.  Height: 5\' 10"  (177.8 cm) Weight: 119.5 kg (263 lb 7.2 oz) IBW/kg (Calculated) : 73  No data recorded.  Recent Labs  Lab 05/23/23 2044 05/23/23 2155 05/24/23 0027  WBC 0.6* 1.6*  --   CREATININE 10.40*  --   --   LATICACIDVEN  --   --  6.6*    Estimated Creatinine Clearance: 11 mL/min (A) (by C-G formula based on SCr of 10.4 mg/dL (H)).    No Known Allergies  Antimicrobials this admission: Cefepime 9/2 >>  Vancomycin 9/2 >>   Microbiology results: 9/2 BCx:  9/2 Sputum:    Thank you for allowing pharmacy to be a part of this patient's care.  Arabella Merles, PharmD. Clinical Pharmacist 05/24/2023 1:17 AM

## 2023-05-24 NOTE — Progress Notes (Signed)
Pharmacy Antibiotic Note  Ray Bradley is a 52 y.o. male admitted on 05/23/2023 with sepsis secondary to septic pulmonary emboli.  Pharmacy has been consulted for cefepime and vancomycin dosing. Patient presented with acute onset of chestpain after failure to run his dialysis at home. His home dialysis schedule is 4 times weekly at home giving 2 days ad then a 1 day break in between.  CT abdomen showed concerns for septic arthritis and multiple nonspecific pulmonary nodules >> septic emboli. CT- L spine showed no signs of osteo/discitis  Plan for HD on 9/3, then TTSa during admission WBC 3.4, fever curve trending down to 98.7   Plan: Continue Cefepime 1gm IV every 24 hours Change Vancomycin to 1250mg  IV qTTS HD Monitor plans for HD Follow up signs of clinical improvement, surgical plans, LOT, de-escalation of antibiotics.  Height: 5\' 10"  (177.8 cm) Weight: 119.5 kg (263 lb 7.2 oz) IBW/kg (Calculated) : 73  Temp (24hrs), Avg:99.6 F (37.6 C), Min:98.2 F (36.8 C), Max:101.2 F (38.4 C)  Recent Labs  Lab 05/23/23 2044 05/23/23 2155 05/24/23 0027 05/24/23 0218 05/24/23 0219 05/24/23 0411 05/24/23 1140 05/24/23 1145  WBC 0.6* 1.6*  --  3.4*  --   --   --   --   CREATININE 10.40*  --   --  11.74*  --   --   --  12.66*  LATICACIDVEN  --   --  6.6*  --  7.6* 5.8* 4.2*  --     Estimated Creatinine Clearance: 9 mL/min (A) (by C-G formula based on SCr of 12.66 mg/dL (H)).    No Known Allergies  Antimicrobials this admission: Cefepime 9/2 >>  Vancomycin 9/2 >>   Microbiology results: 9/2 BCx: NGTD <12h 9/2 Sputum:  sent  Thank you for allowing pharmacy to be a part of this patient's care.  Wilburn Cornelia, PharmD, BCPS Clinical Pharmacist 05/24/2023 3:12 PM   Please refer to [facility] for pharmacy phone number

## 2023-05-24 NOTE — Progress Notes (Signed)
Pt receives out-pt home HD care through [facility]. Contacted [facility] RN today to make her aware of pt's admission. Will assist as needed.   Olivia Canter Renal Navigator [phone]

## 2023-05-24 NOTE — Consult Note (Signed)
[facility] Renal Consultation Note    Indication for Consultation:  Management of ESRD PCP: Tally Joe, MD   HPI: Quantay Trbovich is a 52 y.o. 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 We were consulted to manage his hemodialysis needs as an inpatient. He states his symptoms began some time after beginning his home dialysis, at which point he noticed that that the machine was receiving blood flow but not dialysate flow. After troubleshooting w/ HHD team, his treatment resumed but was stopped due to symptoms not resolving. Prior to this episode he was receiving dialysis per schedule without issue, with last session being 8/31.He endorses chills, chest pain, and some back pain but denies access site erythema/edema/warmth and shortness of breath.  Past Medical History:  Diagnosis Date   Anemia    Asthma    as a child   Chest pain    Chronic kidney disease    dialysis - at home hemodialysis -does 2 days in a row with one day off   COVID    2022 - mild case   History of kidney stones    HTN (hypertension)    Hyperlipidemia    Pneumonia    as a child   Type II or unspecified type diabetes mellitus without mention of complication, not stated as uncontrolled    Wears glasses    Past Surgical History:  Procedure Laterality Date   AV FISTULA PLACEMENT Left 08/24/2019   Procedure: CREATION OF ARTERIOVENOUS (AV) FISTULA  LEFT ARM;  Surgeon: Cephus Shelling, MD;  Location: [facility] OR;  Service: Vascular;  Laterality: Left;   CARPAL TUNNEL RELEASE Left 12/30/2022   Procedure: LEFT CARPAL TUNNEL RELEASE;  Surgeon: Betha Loa, MD;  Location: [facility] OR;  Service: Orthopedics;  Laterality: Left;  30 MIN   CHOLECYSTECTOMY N/A 10/25/2022   Procedure: LAPAROSCOPIC CHOLECYSTECTOMY;  Surgeon: Diamantina Monks, MD;  Location: [facility] OR;  Service: General;  Laterality: N/A;   CIRCUMCISION, NON-NEWBORN  2004    COLONOSCOPY W/ BIOPSIES AND POLYPECTOMY     EYE SURGERY     FOOT SURGERY     IR FLUORO GUIDE CV LINE RIGHT  07/23/2019   IR FLUORO GUIDE CV LINE RIGHT  09/19/2019   IR US GUIDE VASC ACCESS RIGHT  07/23/2019   MULTIPLE TOOTH EXTRACTIONS     Family History  Problem Relation Age of Onset   Colon cancer Father    Diabetes Other        family history   Hypertension Other        family history   Aneurysm Mother        died age 10, brain   Hypertension Mother    Hypertension Sister 39   Hypertension Sister 26   Hypertension Brother 46   Social History:  reports that he has never smoked. He has been exposed to tobacco smoke. He has never used smokeless tobacco. He reports that he does not currently use alcohol. He reports that he does not use drugs.  ROS: As per HPI otherwise negative.  Physical Exam: Vitals:   05/24/23 0530 05/24/23 0600 05/24/23 0630 05/24/23 0842  BP: 98/68 (!) 109/58 97/86   Pulse: 91 92 89   Resp: 19 18 20    Temp:    98.2 F (36.8 C)  TempSrc:    Oral  SpO2: 95% 96% 97%   Weight:  Height:         General: Well developed, well nourished adult male with visible rigors.  Head: Normocephalic, atraumatic, sclera non-icteric, mucus membranes are dry. Neck: Supple without lymphadenopathy/masses. JVD not elevated. Lungs: Clear bilaterally to auscultation without wheezes, rales, or rhonchi. Breathing is unlabored. Heart: RRR with normal S1, S2. No murmurs, rubs, or gallops appreciated. Abdomen: Soft, non-tender, non-distended with normoactive bowel sounds. No rebound/guarding. No obvious abdominal masses. Musculoskeletal:  Strength and tone appear normal for age. Lower extremities: No edema or ischemic changes, no open wounds. Neuro: Alert and oriented X 3. Moves all extremities spontaneously. Psych:  Responds to questions appropriately with a normal affect. Dialysis Access: LLAAVF present with good bruit/thrill and without erythema, edema, or warmth to  palpation.   No Known Allergies Prior to Admission medications   Medication Sig Start Date End Date Taking? Authorizing Provider  amLODipine (NORVASC) 10 MG tablet Take 10 mg by mouth daily.   Yes [provider]  aspirin EC 81 MG tablet Take 81 mg by mouth daily.    Yes [provider]  B Complex-C-Zn-Folic Acid (DIALYVITE 800-ZINC 15) 0.8 MG TABS Take 1 tablet by mouth daily. 05/11/23  Yes [provider]  calcitRIOL (ROCALTROL) 0.5 MCG capsule Take 0.5 mcg by mouth daily.   Yes [provider]  carvedilol (COREG) 12.5 MG tablet Take 12.5 mg by mouth 2 (two) times daily.   Yes [provider]  cyanocobalamin (VITAMIN B12) 1000 MCG tablet Take 1,000 mcg by mouth daily.   Yes [provider]  ferric citrate (AURYXIA) 1 GM 210 MG(Fe) tablet Take 630 mg by mouth 3 (three) times daily with meals.   Yes [provider]  heparin sodium, porcine, 1000 UNIT/ML injection Inject 4,000 Units into the vein See admin instructions. 2 days on, 1 day off. 02/04/20  Yes [provider]  losartan (COZAAR) 50 MG tablet Take 50 mg by mouth daily. 10/02/19  Yes [provider]  Semaglutide (OZEMPIC, 0.25 OR 0.5 MG/DOSE, [place]) Inject 0.5 mg into the skin once. Wednesday   Yes [provider]   Current Facility-Administered Medications  Medication Dose Route Frequency Provider Last Rate Last Admin   acetaminophen (TYLENOL) tablet 650 mg  650 mg Oral Q6H PRN Janalyn Shy, Subrina, MD       Or   acetaminophen (TYLENOL) suppository 650 mg  650 mg Rectal Q6H PRN Janalyn Shy, Subrina, MD       ceFEPIme (MAXIPIME) 1 g in sodium chloride 0.9 % 100 mL IVPB  1 g Intravenous Q24H Arabella Merles, RPH       ferric citrate (AURYXIA) tablet 420 mg  420 mg Oral TID WC Sundil, Subrina, MD   420 mg at 05/24/23 0853   heparin injection 5,000 Units  5,000 Units Subcutaneous Q8H Sundil, Subrina, MD   5,000 Units at 05/24/23 0230   lactated ringers infusion    Intravenous Continuous Desai, Rahul P, PA-C 50 mL/hr at 05/24/23 0330 New Bag at 05/24/23 0330   ondansetron (ZOFRAN) tablet 4 mg  4 mg Oral Q6H PRN Janalyn Shy, Subrina, MD       Or   ondansetron [facility]) injection 4 mg  4 mg Intravenous Q6H PRN Janalyn Shy, Subrina, MD       oxyCODONE-acetaminophen (PERCOCET/ROXICET) 5-325 MG per tablet 1 tablet  1 tablet Oral Q6H PRN Janalyn Shy, Subrina, MD       perflutren lipid microspheres (DEFINITY) IV suspension  1-10 mL Intravenous PRN Pokhrel, Laxman, MD   2 mL  at 05/24/23 0952   rosuvastatin (CRESTOR) tablet 5 mg  5 mg Oral Daily Sundil, Subrina, MD       senna-docusate (Senokot-S) tablet 1 tablet  1 tablet Oral QHS PRN Janalyn Shy, Subrina, MD       vancomycin variable dose per unstable renal function (pharmacist dosing)   Does not apply See admin instructions Arabella Merles, [facility]       Current Outpatient Medications  Medication Sig Dispense Refill   amLODipine (NORVASC) 10 MG tablet Take 10 mg by mouth daily.     aspirin EC 81 MG tablet Take 81 mg by mouth daily.      B Complex-C-Zn-Folic Acid (DIALYVITE 800-ZINC 15) 0.8 MG TABS Take 1 tablet by mouth daily.     calcitRIOL (ROCALTROL) 0.5 MCG capsule Take 0.5 mcg by mouth daily.     carvedilol (COREG) 12.5 MG tablet Take 12.5 mg by mouth 2 (two) times daily.     cyanocobalamin (VITAMIN B12) 1000 MCG tablet Take 1,000 mcg by mouth daily.     ferric citrate (AURYXIA) 1 GM 210 MG(Fe) tablet Take 630 mg by mouth 3 (three) times daily with meals.     heparin sodium, porcine, 1000 UNIT/ML injection Inject 4,000 Units into the vein See admin instructions. 2 days on, 1 day off.     losartan (COZAAR) 50 MG tablet Take 50 mg by mouth daily.     Semaglutide (OZEMPIC, 0.25 OR 0.5 MG/DOSE, [place]) Inject 0.5 mg into the skin once. Wednesday     Labs: Basic Metabolic Panel: Recent Labs  Lab 05/23/23 2044 05/24/23 0218  NA 139 136  K 4.8 3.4*  CL 95* 97*  CO2 24 20*  GLUCOSE 142* 226*  BUN 54* 58*  CREATININE 10.40* 11.74*   CALCIUM 9.4 8.4*   Liver Function Tests: Recent Labs  Lab 05/24/23 0218  AST 158*  ALT 147*  ALKPHOS 124  BILITOT 1.8*  PROT 6.2*  ALBUMIN 2.9*    CBC: Recent Labs  Lab 05/23/23 2044 05/23/23 2155 05/24/23 0218  WBC 0.6* 1.6* 3.4*  NEUTROABS  --  1.3*  --   HGB 10.8* 9.6* 9.4*  HCT 33.2* 29.4* 29.0*  MCV 84.1 84.2 85.5  PLT 212 182 171    CBG: Recent Labs  Lab 05/23/23 2037  GLUCAP 141*    Studies/Results: ECHOCARDIOGRAM COMPLETE  Result Date: 05/24/2023    ECHOCARDIOGRAM REPORT   Patient Name:   ADONYS KADEN Date of Exam: 05/24/2023 Medical Rec #:  [id number]       Height:       70.0 in Accession #:    [id number]      Weight:       263.4 lb Date of Birth:  06/03/1971      BSA:          2.347 m Patient Age:    51 years        BP:           97/86 mmHg Patient Gender: M               HR:           111 bpm. Exam Location:  Inpatient Procedure: 2D Echo, Cardiac Doppler, Color Doppler and Intracardiac            Opacification Agent Indications:    A fib  History:        Patient has no prior history of Echocardiogram examinations.  Arrythmias:Atrial Fibrillation, Signs/Symptoms:septic shock;                 Risk Factors:Hypertension and Dyslipidemia.  Sonographer:    Melissa Morford RDCS (AE, PE) Referring Phys: [id number] SUBRINA SUNDIL IMPRESSIONS  1. Left ventricular ejection fraction, by estimation, is 70 to 75%. The left ventricle has hyperdynamic function. The left ventricle has no regional wall motion abnormalities. Left ventricular diastolic parameters are consistent with Grade I diastolic dysfunction (impaired relaxation).  2. Right ventricular systolic function is normal. The right ventricular size is normal.  3. The mitral valve is normal in structure. No evidence of mitral valve regurgitation. No evidence of mitral stenosis.  4. The aortic valve is normal in structure. Aortic valve regurgitation is not visualized. No aortic stenosis is present.  5. The  inferior vena cava The infferior vena cava is collapsed, consistent with low left atrial pressure. Conclusion(s)/Recommendation(s): The study suggests relative hypovolemia. Rhythm during the study was sinus tachycardia. FINDINGS  Left Ventricle: Left ventricular ejection fraction, by estimation, is 70 to 75%. The left ventricle has hyperdynamic function. The left ventricle has no regional wall motion abnormalities. The left ventricular internal cavity size was normal in size. There is no left ventricular hypertrophy. Left ventricular diastolic parameters are consistent with Grade I diastolic dysfunction (impaired relaxation). Right Ventricle: The right ventricular size is normal. No increase in right ventricular wall thickness. Right ventricular systolic function is normal. Left Atrium: Left atrial size was normal in size. Right Atrium: Right atrial size was normal in size. Pericardium: There is no evidence of pericardial effusion. Mitral Valve: The mitral valve is normal in structure. No evidence of mitral valve regurgitation. No evidence of mitral valve stenosis. Tricuspid Valve: The tricuspid valve is normal in structure. Tricuspid valve regurgitation is not demonstrated. No evidence of tricuspid stenosis. Aortic Valve: The aortic valve is normal in structure. Aortic valve regurgitation is not visualized. No aortic stenosis is present. Pulmonic Valve: The pulmonic valve was normal in structure. Pulmonic valve regurgitation is not visualized. No evidence of pulmonic stenosis. Aorta: The aortic root is normal in size and structure. Venous: The inferior vena cava The infferior vena cava is collapsed, consistent with low left atrial pressure. IAS/Shunts: No atrial level shunt detected by color flow Doppler.  LEFT VENTRICLE PLAX 2D LVIDd:         4.70 cm   Diastology LVIDs:         3.60 cm   LV e' medial:    8.38 cm/s LV PW:         1.10 cm   LV E/e' medial:  6.5 LV IVS:        1.00 cm   LV e' lateral:   11.40 cm/s  LVOT diam:     2.20 cm   LV E/e' lateral: 4.8 LV SV:         85 LV SV Index:   36 LVOT Area:     3.80 cm  RIGHT VENTRICLE RV S prime:     11.30 cm/s TAPSE (M-mode): 1.7 cm LEFT ATRIUM             Index        RIGHT ATRIUM           Index LA diam:        4.00 cm 1.70 cm/m   RA Area:     15.00 cm LA Vol (A2C):   67.1 ml 28.59 ml/m  RA Volume:   38.10 ml  16.24  ml/m LA Vol (A4C):   61.4 ml 26.16 ml/m LA Biplane Vol: 64.3 ml 27.40 ml/m  AORTIC VALVE LVOT Vmax:   110.00 cm/s LVOT Vmean:  84.000 cm/s LVOT VTI:    0.224 m  AORTA Ao Root diam: 3.40 cm MITRAL VALVE MV Area (PHT): 6.32 cm    SHUNTS MV Decel Time: 120 msec    Systemic VTI:  0.22 m MV E velocity: 54.70 cm/s  Systemic Diam: 2.20 cm MV A velocity: 74.60 cm/s MV E/A ratio:  0.73 Mihai Croitoru MD Electronically signed by Thurmon Fair MD Signature Date/Time: 05/24/2023/10:30:49 AM    Final    CT L-SPINE NO CHARGE  Result Date: 05/24/2023 CLINICAL DATA:  Insert Osteomyelitis, lumbar Lumbar 4 to lumbar 5 vertebra septic arthritis EXAM: CT LUMBAR SPINE WITHOUT CONTRAST TECHNIQUE: Multidetector CT imaging of the lumbar spine was performed without intravenous contrast administration. Multiplanar CT image reconstructions were also generated. RADIATION DOSE REDUCTION: This exam was performed according to the departmental dose-optimization program which includes automated exposure control, adjustment of the mA and/or kV according to patient size and/or use of iterative reconstruction technique. COMPARISON:  CT abdomen and pelvis today and 10/21/2022. FINDINGS: Segmentation: 5 lumbar type vertebrae. Alignment: Normal Vertebrae: No acute fracture or focal pathologic process. Paraspinal and other soft tissues: Normal Disc levels: Disc spaces maintained. Mild right facet arthropathy with erosions on the right at L4-5 as seen on earlier study. This is stable when compared to prior CT from February of this year. This is most compatible with facet arthritis. No  evidence of infection. IMPRESSION: No acute bony abnormality. No evidence of discitis or osteomyelitis by CT. Degenerative right facet arthropathy at L4-5 unchanged since prior study. Electronically Signed   By: Charlett Nose M.D.   On: 05/24/2023 00:44   CT ABDOMEN PELVIS WO CONTRAST  Result Date: 05/23/2023 CLINICAL DATA:  Sepsis.  Lower back pain. EXAM: CT ABDOMEN AND PELVIS WITHOUT CONTRAST TECHNIQUE: Multidetector CT imaging of the abdomen and pelvis was performed following the standard protocol without IV contrast. RADIATION DOSE REDUCTION: This exam was performed according to the departmental dose-optimization program which includes automated exposure control, adjustment of the mA and/or kV according to patient size and/or use of iterative reconstruction technique. COMPARISON:  MRI abdomen 10/22/2022 and CT abdomen and pelvis 10/21/2022 FINDINGS: Lower chest: Multiple new pulmonary nodules are present in the lung bases. For example in the left lower lobe posteriorly on series 3/image 17 measuring 7 mm. Additional nodules are present in the right lower lobe on 3/10 and 3/7 and right middle lobe on 3/5. There is mild ground-glass opacity about each nodule. Hepatobiliary: Cholecystectomy. Unremarkable noncontrast appearance of the liver. No biliary dilation. Pancreas: Unremarkable. Spleen: Unremarkable. Adrenals/Urinary Tract: Stable adrenal glands. No urinary calculi or hydronephrosis. Nondistended thick-walled bladder. Stomach/Bowel: Normal caliber large and small bowel. No bowel wall thickening. The appendix is normal.Stomach is within normal limits. Vascular/Lymphatic: Aortic atherosclerosis. No enlarged abdominal or pelvic lymph nodes. Reproductive: Unremarkable. Other: No free intraperitoneal fluid or air. Musculoskeletal: No acute fracture. Right L4-L5 facet arthritis with mild erosive change. IMPRESSION: 1. Right L4-L5 facet joint arthritis with mild erosive change. Correlation with site of pain is  recommended. If there is concern for septic arthritis, MRI is recommended. 2. Multiple pulmonary nodules are present bilaterally measuring up to 7 mm with surrounding ground-glass opacity. This is nonspecific and can be seen in a variety of infectious, neoplastic, and inflammatory conditions including septic emboli. Consider dedicated CT chest for further evaluation. 3.  Nondistended thick-walled bladder. Correlate with urinalysis. Aortic Atherosclerosis (ICD10-I70.0). Electronically Signed   By: Minerva Fester M.D.   On: 05/23/2023 23:20   DG Chest Port 1 View  Result Date: 05/23/2023 CLINICAL DATA:  Questionable sepsis - evaluate for abnormality EXAM: PORTABLE CHEST - 1 VIEW COMPARISON:  11/19/2013 FINDINGS: Low lung volumes. Mild perihilar and bibasilar interstitial prominence. No confluent airspace disease. Heart size and mediastinal contours are within normal limits. No effusion. Visualized bones unremarkable. IMPRESSION: Low lung volumes with mild perihilar and bibasilar interstitial prominence. Electronically Signed   By: Corlis Leak M.D.   On: 05/23/2023 21:32    Dialysis Orders: Home HD four times weekly.  Assessment/Plan: ESRD:  Patient typically does well at home, his symptoms likely stem from his dialysis mishap causing subsequent sepsis w/ lactic acidosis. He is not currently hypervolemic and his electrolytes, BUN, and Cr should be corrected once he is dialyzed pending MD orders. He has requested to self-cannulate due to concerns regarding his access, otherwise he expresses no concerns regarding dialysis. Blood cultures are pending and he is being treated with empiric Cefepime. Trend renal panel & CBC. 1200 mL fluid restriction with I&O monitoring. Continue renal diet as tolerated. Monitor for symptomatic changes when undergoing HD treatments. Daily weights. Sepsis w/ Lactic Acidosis: see above. Currently managed by ED team after Pulm-Crit consulted. Responded well to fluid resuscitation with LA  trending downward & BP improved.    Irving Burton Penn, PA-S2 05/24/2023, 10:41 AM  [facility]  Pt seen, examined and agree w A/P as above.  Plan for HD upstairs sometime today.  Vinson Moselle MD  [facility] 05/24/2023, 1:01 PM

## 2023-05-24 NOTE — ED Notes (Signed)
Discussed pt with 5W nurse. Advised to correct pt's BP before transport d/t instability.

## 2023-05-24 NOTE — ED Notes (Signed)
Ct came for patient, patient requested to wait to go to ct.

## 2023-05-24 NOTE — ED Notes (Signed)
Discussed pt status with Dr. Glenna Fellows from nephrology. Relayed information back to Dr. Tyson Babinski. ICU team to see pt.

## 2023-05-24 NOTE — Progress Notes (Signed)
An USGPIV (ultrasound guided PIV) has been placed for short-term vasopressor infusion. A correctly placed ivWatch must be used when administering Vasopressors. Should this treatment be needed beyond 24 hours, central line access should be obtained.  It will be the responsibility of the bedside nurse to follow best practice to prevent extravasations.  PIV started by Dulcy Fanny RN.

## 2023-05-24 NOTE — Progress Notes (Signed)
PROGRESS NOTE    Ray Bradley  AVW:[id number] DOB: 16-Dec-1970 DOA: 05/23/2023 PCP: Tally Joe, MD    Brief Narrative:  Ray Bradley is a 52 y.o. male with medical history significant of ESRD on HD, essential hypertension, hyperlipidemia and DM type II mellitus presented to the hospital with complains fever, chills and back pain while dialysis.  In the ED patient was noted to be tachycardic, hypotensive.  Labs showed WBC at 0.6 with hemoglobin 9.6.  EKG showed atrial fibrillation with 2 is to 1 block.  Respiratory panel was negative.  Chest x-ray showed low lung volumes with bibasilar prominence.  Blood cultures were sent from the ED. CT abdomen pelvis showed a right L4-L5 facet joint arthritis and mild erosive changes with concern for septic arthritis and MRI was recommended.  Multiple pulmonary nodules were seen as well.  Patient received IV fluid bolus in the ED due to low blood pressure and was started on broad-spectrum antibiotic with cefepime vancomycin and metronidazole.  Patient was then admitted hospital for sepsis and leukopenia with hypotension.  Assessment and Plan:  Septic shock- Complaining of back pain.  Had lactic acidosis leukopenia.  CT scan with facet arthritis with mild erosive changes concerning for septic arthritis.  Currently on vancomycin, cefepime. Blood cultures negative in less than 12 hours.  COVID influenza was negative.  PCCM was consulted and no need for ICU admission.  Received IV fluids and currently on hold due to patient being on dialysis.  CT scan of the lumbar spine with no discitis or osteomyelitis. Degenerative right facet arthropathy at L4-5 unchanged since prior study.  Temperature max of 101.2 F at this time.  WBC at 3.4.    Patient still remains hypotensive tachycardic and febrile.  Communicated back with PCCM team again for revision of assessment, potential need for pressors and ICU level of care.  Patient with significant elevation of lactate,  initial lactate at 7.6 trending down at 4.2.   Leukopenia/Neutropenia -Initial CBC showed low WBC 0.06 and repeat WBC 1.6.  WBC now at 3.4.  Absolute neutrophil count 1.3. Relative leukopenia in the setting of septic shock.  Continue treatment with broad-spectrum antibiotic.  Mild hypokalemia.  Potassium 3.4 could be replaced with dialysis.   ESRD on HD -ESRD on home dialysis 4 times weekly at home.  Patient reported doing dialysis 2 days in a row and 1 day break.  Nephrology on board for dialysis needs.   Paroxysmal atrial fibrillation -Initial EKG showed atrial fibrillation with RVR with 2:1 AV block.  Improved after IV fluid.  Continue to monitor closely.  Subcentimeter pulmonary nodules.   CCM recommend dedicated chest CT scan in 6 to 8 weeks as outpatient.      Essential hypertension Due to low blood pressure amlodipine 10 mg, Coreg 12.5 mg and losartan 50 mg from home on hold  Hyperlipidemia - Continue Crestor      DVT prophylaxis: heparin injection 5,000 Units Start: 05/24/23 0130 SCDs Start: 05/24/23 0028   Code Status:     Code Status: Full Code  Disposition: Home uncertain at this time  Status is: Inpatient  Remains inpatient appropriate because: Septic shock, IV antibiotics, pending clinical improvement, critically ill patient.   Family Communication: Spoke with the patient's wife at bedside.  Consultants:  PCCM Nephrology  Procedures:  None yet  Antimicrobials:  Vancomycin and cefepime as per pharmacy dosing  Anti-infectives (From admission, onward)    Start     Dose/Rate Route Frequency Ordered Stop  05/24/23 2200  ceFEPIme (MAXIPIME) 1 g in sodium chloride 0.9 % 100 mL IVPB        1 g 200 mL/hr over 30 Minutes Intravenous Every 24 hours 05/24/23 0122     05/24/23 1800  vancomycin (VANCOREADY) IVPB 1250 mg/250 mL        1,250 mg 166.7 mL/hr over 90 Minutes Intravenous Every T-Th-Sa (Hemodialysis) 05/24/23 1501     05/24/23 0122  vancomycin  variable dose per unstable renal function (pharmacist dosing)  Status:  Discontinued         Does not apply See admin instructions 05/24/23 0122 05/24/23 1501   05/23/23 2300  vancomycin (VANCOCIN) IVPB 1000 mg/200 mL premix       Placed in "Followed by" Linked Group   1,000 mg 200 mL/hr over 60 Minutes Intravenous  Once 05/23/23 2101 05/24/23 0012   05/23/23 2115  vancomycin (VANCOCIN) IVPB 1000 mg/200 mL premix       Placed in "Followed by" Linked Group   1,000 mg 200 mL/hr over 60 Minutes Intravenous  Once 05/23/23 2101 05/23/23 2258   05/23/23 2100  ceFEPIme (MAXIPIME) 2 g in sodium chloride 0.9 % 100 mL IVPB        2 g 200 mL/hr over 30 Minutes Intravenous  Once 05/23/23 2049 05/23/23 2249   05/23/23 2100  metroNIDAZOLE (FLAGYL) IVPB 500 mg        500 mg 100 mL/hr over 60 Minutes Intravenous  Once 05/23/23 2049 05/23/23 2249   05/23/23 2100  vancomycin (VANCOCIN) IVPB 1000 mg/200 mL premix  Status:  Discontinued        1,000 mg 200 mL/hr over 60 Minutes Intravenous  Once 05/23/23 2049 05/23/23 2101       Subjective: Today, patient was seen and examined at bedside.  Patient appears to be sleepy and still complains of some back pain with loose stools.  Patient's wife at bedside.  Nursing staff reported hypotension.  Objective: Vitals:   05/24/23 1655 05/24/23 1705 05/24/23 1710 05/24/23 1715  BP: (!) 84/43 (!) 81/48  (!) 89/53  Pulse: (!) 104   (!) 106  Resp: (!) 22 (!) 24  (!) 22  Temp:   99.3 F (37.4 C)   TempSrc:   Oral   SpO2: 99%   97%  Weight:      Height:        Intake/Output Summary (Last 24 hours) at 05/24/2023 1729 Last data filed at 05/24/2023 0330 Gross per 24 hour  Intake 2100 ml  Output --  Net 2100 ml   Filed Weights   05/23/23 2022  Weight: 119.5 kg    Physical Examination: Body mass index is 37.8 kg/m.   General: Obese built, not in obvious distress HENT:   No scleral pallor or icterus noted. Oral mucosa is moist.  Chest:  Diminished breath  sounds bilaterally. No crackles or wheezes.  CVS: S1 &S2 heard. No murmur.  Regular rate and rhythm. Abdomen: Soft, nontender, nondistended.  Bowel sounds are heard.   Extremities: No cyanosis, clubbing or edema.  Peripheral pulses are palpable.  Left upper extremity hemodialysis access. Psych: Alert, awake but mildly somnolent at times, CNS:  No cranial nerve deficits.  Power equal in all extremities.   Skin: Warm and dry.  No rashes noted.  Data Reviewed:   CBC: Recent Labs  Lab 05/23/23 2044 05/23/23 2155 05/24/23 0218  WBC 0.6* 1.6* 3.4*  NEUTROABS  --  1.3*  --   HGB 10.8* 9.6* 9.4*  HCT 33.2* 29.4* 29.0*  MCV 84.1 84.2 85.5  PLT 212 182 171    Basic Metabolic Panel: Recent Labs  Lab 05/23/23 2044 05/24/23 0218 05/24/23 1145  NA 139 136 135  K 4.8 3.4* 3.9  CL 95* 97* 96*  CO2 24 20* 19*  GLUCOSE 142* 226* 146*  BUN 54* 58* 71*  CREATININE 10.40* 11.74* 12.66*  CALCIUM 9.4 8.4* 8.0*    Liver Function Tests: Recent Labs  Lab 05/24/23 0218 05/24/23 1145  AST 158* 121*  ALT 147* 152*  ALKPHOS 124 121  BILITOT 1.8* 2.6*  PROT 6.2* 6.5  ALBUMIN 2.9* 2.9*     Radiology Studies: ECHOCARDIOGRAM COMPLETE  Result Date: 05/24/2023    ECHOCARDIOGRAM REPORT   Patient Name:   DARRYLE MAHFOUZ Date of Exam: 05/24/2023 Medical Rec #:  [id number]       Height:       70.0 in Accession #:    [id number]      Weight:       263.4 lb Date of Birth:  1971/06/25      BSA:          2.347 m Patient Age:    51 years        BP:           97/86 mmHg Patient Gender: M               HR:           111 bpm. Exam Location:  Inpatient Procedure: 2D Echo, Cardiac Doppler, Color Doppler and Intracardiac            Opacification Agent Indications:    A fib  History:        Patient has no prior history of Echocardiogram examinations.                 Arrythmias:Atrial Fibrillation, Signs/Symptoms:septic shock;                 Risk Factors:Hypertension and Dyslipidemia.  Sonographer:    Melissa  Morford RDCS (AE, PE) Referring Phys: [id number] SUBRINA SUNDIL IMPRESSIONS  1. Left ventricular ejection fraction, by estimation, is 70 to 75%. The left ventricle has hyperdynamic function. The left ventricle has no regional wall motion abnormalities. Left ventricular diastolic parameters are consistent with Grade I diastolic dysfunction (impaired relaxation).  2. Right ventricular systolic function is normal. The right ventricular size is normal.  3. The mitral valve is normal in structure. No evidence of mitral valve regurgitation. No evidence of mitral stenosis.  4. The aortic valve is normal in structure. Aortic valve regurgitation is not visualized. No aortic stenosis is present.  5. The inferior vena cava The infferior vena cava is collapsed, consistent with low left atrial pressure. Conclusion(s)/Recommendation(s): The study suggests relative hypovolemia. Rhythm during the study was sinus tachycardia. FINDINGS  Left Ventricle: Left ventricular ejection fraction, by estimation, is 70 to 75%. The left ventricle has hyperdynamic function. The left ventricle has no regional wall motion abnormalities. The left ventricular internal cavity size was normal in size. There is no left ventricular hypertrophy. Left ventricular diastolic parameters are consistent with Grade I diastolic dysfunction (impaired relaxation). Right Ventricle: The right ventricular size is normal. No increase in right ventricular wall thickness. Right ventricular systolic function is normal. Left Atrium: Left atrial size was normal in size. Right Atrium: Right atrial size was normal in size. Pericardium: There is no evidence of pericardial effusion. Mitral Valve: The mitral valve is normal in  structure. No evidence of mitral valve regurgitation. No evidence of mitral valve stenosis. Tricuspid Valve: The tricuspid valve is normal in structure. Tricuspid valve regurgitation is not demonstrated. No evidence of tricuspid stenosis. Aortic Valve: The  aortic valve is normal in structure. Aortic valve regurgitation is not visualized. No aortic stenosis is present. Pulmonic Valve: The pulmonic valve was normal in structure. Pulmonic valve regurgitation is not visualized. No evidence of pulmonic stenosis. Aorta: The aortic root is normal in size and structure. Venous: The inferior vena cava The infferior vena cava is collapsed, consistent with low left atrial pressure. IAS/Shunts: No atrial level shunt detected by color flow Doppler.  LEFT VENTRICLE PLAX 2D LVIDd:         4.70 cm   Diastology LVIDs:         3.60 cm   LV e' medial:    8.38 cm/s LV PW:         1.10 cm   LV E/e' medial:  6.5 LV IVS:        1.00 cm   LV e' lateral:   11.40 cm/s LVOT diam:     2.20 cm   LV E/e' lateral: 4.8 LV SV:         85 LV SV Index:   36 LVOT Area:     3.80 cm  RIGHT VENTRICLE RV S prime:     11.30 cm/s TAPSE (M-mode): 1.7 cm LEFT ATRIUM             Index        RIGHT ATRIUM           Index LA diam:        4.00 cm 1.70 cm/m   RA Area:     15.00 cm LA Vol (A2C):   67.1 ml 28.59 ml/m  RA Volume:   38.10 ml  16.24 ml/m LA Vol (A4C):   61.4 ml 26.16 ml/m LA Biplane Vol: 64.3 ml 27.40 ml/m  AORTIC VALVE LVOT Vmax:   110.00 cm/s LVOT Vmean:  84.000 cm/s LVOT VTI:    0.224 m  AORTA Ao Root diam: 3.40 cm MITRAL VALVE MV Area (PHT): 6.32 cm    SHUNTS MV Decel Time: 120 msec    Systemic VTI:  0.22 m MV E velocity: 54.70 cm/s  Systemic Diam: 2.20 cm MV A velocity: 74.60 cm/s MV E/A ratio:  0.73 Mihai Croitoru MD Electronically signed by Thurmon Fair MD Signature Date/Time: 05/24/2023/10:30:49 AM    Final    CT L-SPINE NO CHARGE  Result Date: 05/24/2023 CLINICAL DATA:  Insert Osteomyelitis, lumbar Lumbar 4 to lumbar 5 vertebra septic arthritis EXAM: CT LUMBAR SPINE WITHOUT CONTRAST TECHNIQUE: Multidetector CT imaging of the lumbar spine was performed without intravenous contrast administration. Multiplanar CT image reconstructions were also generated. RADIATION DOSE REDUCTION: This  exam was performed according to the departmental dose-optimization program which includes automated exposure control, adjustment of the mA and/or kV according to patient size and/or use of iterative reconstruction technique. COMPARISON:  CT abdomen and pelvis today and 10/21/2022. FINDINGS: Segmentation: 5 lumbar type vertebrae. Alignment: Normal Vertebrae: No acute fracture or focal pathologic process. Paraspinal and other soft tissues: Normal Disc levels: Disc spaces maintained. Mild right facet arthropathy with erosions on the right at L4-5 as seen on earlier study. This is stable when compared to prior CT from February of this year. This is most compatible with facet arthritis. No evidence of infection. IMPRESSION: No acute bony abnormality. No evidence of discitis or  osteomyelitis by CT. Degenerative right facet arthropathy at L4-5 unchanged since prior study. Electronically Signed   By: Charlett Nose M.D.   On: 05/24/2023 00:44   CT ABDOMEN PELVIS WO CONTRAST  Result Date: 05/23/2023 CLINICAL DATA:  Sepsis.  Lower back pain. EXAM: CT ABDOMEN AND PELVIS WITHOUT CONTRAST TECHNIQUE: Multidetector CT imaging of the abdomen and pelvis was performed following the standard protocol without IV contrast. RADIATION DOSE REDUCTION: This exam was performed according to the departmental dose-optimization program which includes automated exposure control, adjustment of the mA and/or kV according to patient size and/or use of iterative reconstruction technique. COMPARISON:  MRI abdomen 10/22/2022 and CT abdomen and pelvis 10/21/2022 FINDINGS: Lower chest: Multiple new pulmonary nodules are present in the lung bases. For example in the left lower lobe posteriorly on series 3/image 17 measuring 7 mm. Additional nodules are present in the right lower lobe on 3/10 and 3/7 and right middle lobe on 3/5. There is mild ground-glass opacity about each nodule. Hepatobiliary: Cholecystectomy. Unremarkable noncontrast appearance of  the liver. No biliary dilation. Pancreas: Unremarkable. Spleen: Unremarkable. Adrenals/Urinary Tract: Stable adrenal glands. No urinary calculi or hydronephrosis. Nondistended thick-walled bladder. Stomach/Bowel: Normal caliber large and small bowel. No bowel wall thickening. The appendix is normal.Stomach is within normal limits. Vascular/Lymphatic: Aortic atherosclerosis. No enlarged abdominal or pelvic lymph nodes. Reproductive: Unremarkable. Other: No free intraperitoneal fluid or air. Musculoskeletal: No acute fracture. Right L4-L5 facet arthritis with mild erosive change. IMPRESSION: 1. Right L4-L5 facet joint arthritis with mild erosive change. Correlation with site of pain is recommended. If there is concern for septic arthritis, MRI is recommended. 2. Multiple pulmonary nodules are present bilaterally measuring up to 7 mm with surrounding ground-glass opacity. This is nonspecific and can be seen in a variety of infectious, neoplastic, and inflammatory conditions including septic emboli. Consider dedicated CT chest for further evaluation. 3. Nondistended thick-walled bladder. Correlate with urinalysis. Aortic Atherosclerosis (ICD10-I70.0). Electronically Signed   By: Minerva Fester M.D.   On: 05/23/2023 23:20   DG Chest Port 1 View  Result Date: 05/23/2023 CLINICAL DATA:  Questionable sepsis - evaluate for abnormality EXAM: PORTABLE CHEST - 1 VIEW COMPARISON:  11/19/2013 FINDINGS: Low lung volumes. Mild perihilar and bibasilar interstitial prominence. No confluent airspace disease. Heart size and mediastinal contours are within normal limits. No effusion. Visualized bones unremarkable. IMPRESSION: Low lung volumes with mild perihilar and bibasilar interstitial prominence. Electronically Signed   By: Corlis Leak M.D.   On: 05/23/2023 21:32      LOS: 0 days    Joycelyn Das, MD [facility] Available via Epic secure chat 7am-7pm After these hours, please refer to coverage provider listed on  [email or web address] 05/24/2023, 5:29 PM

## 2023-05-24 NOTE — Hospital Course (Addendum)
Ray Bradley is a 52 y.o. male with medical history significant of ESRD on HD, essential hypertension, hyperlipidemia and DM type II mellitus presented to the hospital with complains fever, chills and back pain while dialysis.  In the ED patient was noted to be tachycardic, hypotensive.  Labs showed WBC at 0.6 with hemoglobin 9.6.  EKG showed atrial fibrillation with 2 is to 1 block.  Respiratory panel was negative.  Chest x-ray showed low lung volumes with bibasilar prominence.  Blood cultures were sent from the ED. CT abdomen pelvis showed a right L4-L5 facet joint arthritis and mild erosive changes with concern for septic arthritis and MRI was recommended.  Multiple pulmonary nodules were seen as well.  Patient received IV fluid bolus in the ED due to low blood pressure and was started on broad-spectrum antibiotic with cefepime vancomycin and metronidazole.  Patient was then admitted hospital for sepsis and leukopenia with hypotension.  Assessment and Plan:  Septic shock- Complaining of back pain.  Had lactic acidosis leukopenia.  CT scan with facet arthritis with mild erosive changes concerning for septic arthritis.  Currently on vancomycin, cefepime. Blood cultures negative in less than 12 hours.  COVID influenza was negative.  PCCM was consulted and no need for ICU admission.  Received IV fluids and currently on hold due to patient being on dialysis.  CT scan of the lumbar spine with no discitis or osteomyelitis. Degenerative right facet arthropathy at L4-5 unchanged since prior study.  Temperature max of 101.2 F at this time.  WBC at 3.4.   Leukopenia/Neutropenia -Initial CBC showed low WBC 0.06 and repeat WBC 1.6.  WBC now at 3.4.  Absolute neutrophil count 1.3. Relative leukopenia in the setting of septic shock.  Continue treatment with broad-spectrum antibiotic.  Mild hypokalemia.  Potassium 3.4 could be replaced with dialysis.   ESRD on HD -ESRD on home dialysis 4 times weekly at home.   Patient reported doing dialysis 2 days in a row and 1 day break.  Nephrology on board for dialysis needs.   Paroxysmal atrial fibrillation -Initial EKG showed atrial fibrillation with RVR with 2:1 AV block.  Improved after IV fluid.  Continue to monitor closely.  Subcentimeter pulmonary nodules.   CCM recommend dedicated chest CT scan in 6 to 8 weeks as outpatient.      Essential hypertension Due to low blood pressure amlodipine 10 mg, Coreg 12.5 mg and losartan 50 mg from home on hold  Hyperlipidemia - Continue Crestor

## 2023-05-24 NOTE — Progress Notes (Addendum)
eLink Physician-Brief Progress Note Patient Name: Ray Bradley DOB: Mar 18, 1971 MRN: [id number]   Date of Service  05/24/2023  HPI/Events of Note  52 y/o gentleman with a history of HTN, ESRD on home HD who presented with acute onset nausea, vomiting, back pain that began shortly after starting his home HD presented with sepsis thought to be secondary to bacteremia with concern for hypovolemia.  On presentation he is hypotensive, tachycardic, and mildly tachypneic.  Above baseline weight of 116 kg at 119 kg.  Initial results show mild anion gap metabolic acidosis with elevated creatinine, elevated lactic acid, and chronic transaminitis.  Stable anemia and leukopenia.  Blood cultures negative thus far   eICU Interventions  Additional 500 cc LR bolus.  Start norepinephrine peripherally if BP is refractory to this.  Stool pathogen panel workup pending-empiric antibiotics already in place.  Cultures no growth to date anticipating CT abdomen  Heparin subcutaneous for DVT prophylaxis No indication for GI prophylaxis   2316 -CT chest reviewed, no acute findings.  Able to wean norepinephrine with fluid bolus. 2358 - insomnia, add trazodone PRN  0149 - 1/4 cultures with serratia, loaded with vanc till next HD. DC vanc for now  0510 -magnesium 1.5.  Potassium 4.0.  Magnesium sulfate ordered.  7425 -ongoing intermittent watery diarrhea.  Can initiate Imodium if GI pathogen panel is negative.  C. difficile testing negative.  Will initiate additional 500 cc LR bolus over 2 hours.  Intervention Category Intermediate Interventions: Hypotension - evaluation and management Evaluation Type: New Patient Evaluation  [name] 05/24/2023, 9:06 PM

## 2023-05-24 NOTE — Progress Notes (Signed)
NAME:  Ray Bradley, MRN:  [id number], DOB:  10/14/1970, LOS: 0 ADMISSION DATE:  05/23/2023, CONSULTATION DATE:  05/24/23 REFERRING MD:  Janalyn Shy CHIEF COMPLAINT:  Chest Pain, chills   History of Present Illness:  Ray Bradley is a 52 y.o. male who has a PMH as below including ESRD on HD at home four times per week. He presented to [facility] ED 9/2 with chest pain. He had hooked himself up to dialysis and apparently there was an issue with the machine with the dialysate not running but his blood was circulating through circuit and he began to have shaking chills followed by chest pain and occasional back pain. Dialysis staff were called and trouble shot the machine and got it working correctly; however, chills persisted so wife stopped the session and called EMS. His last session was two days prior where he completed a normal full session. He had felt fine prior to HD start and in the days preceding this.  In ED, he had nausea and vomited at least once. No feelings of aspiration or coughing. He was also hypotensive with SBP in 90s and he apparently normally has SBP in 120-130 range after dialysis and 140-160 range when not getting dialysis.  He had CT abd/pelv that showed right L4-L5 facet joint arthritis, multiple pulmonary nodules with surrounding GGO, thick walled GB. CT L spine was negative for acute processes.  Given hypotension, PCCM called to assess. He received 1L IVF and BP responded with MAP's in the 85 range.  He currently has no chest pain and nausea is also resolved. He is asking for food and drink. He has dry mucous membranes and it appears he could tolerate more volume; however, nephrology concerned about volume overload and have requested we limit additional fluid bolus to 500cc overnight with reassessment in AM.  He denies any fever though temp in ED was 101. He state "I'm always warm". With exception of chills during HD start, denies recent chills/sweats/myalgias.  Initial lactate elevated  at 6.6 and WBC low at 0.6.  BP responded well to fluids with decreasing lactic acid.   Pertinent  Medical History:  has HTN (hypertension); Diabetes (HCC); Chest pain; CKD (chronic kidney disease) stage 5, GFR less than 15 ml/min (HCC); ESRD (end stage renal disease) (HCC); Allergy, unspecified, initial encounter; Anaphylactic shock, unspecified, initial encounter; Anemia in chronic kidney disease; Coagulation defect, unspecified (HCC); Complication of vascular dialysis catheter; Diarrhea, unspecified; Type 2 diabetes mellitus with diabetic neuropathy, unspecified (HCC); Dependence on renal dialysis (HCC); Hypercalcemia; Hyperkalemia; Iron deficiency anemia, unspecified; Long term (current) use of anticoagulants; Mild protein-calorie malnutrition (HCC); Mixed dyslipidemia; Class 2 obesity in adult; Other abnormal findings in urine; Other disorders of phosphorus metabolism; Fluid overload, unspecified; Hyperlipidemia; 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, subsequent encounter; Other specified diseases of liver; Pain, unspecified; Encounter for immunization; Pruritus, unspecified; 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); Secondary hyperparathyroidism of renal origin (HCC); Shortness of breath; Type 2 diabetes mellitus with unspecified diabetic retinopathy without macular edema (HCC); Underimmunization status; Dysthymia; Hyperglycemia due to type 2 diabetes mellitus (HCC); Hypertensive retinopathy; Personal history of colonic polyps; Symptomatic cholelithiasis; End-stage renal disease on hemodialysis (HCC); Essential hypertension; Dyslipidemia; Type 2 diabetes mellitus with chronic kidney disease, without long-term current use of insulin (HCC); Elevated LFTs; History of laparoscopic cholecystectomy; Septic shock (HCC); Leukopenia; and Paroxysmal atrial fibrillation (HCC) on their problem list.  Significant Hospital Events: Including procedures, antibiotic start  and stop dates in addition to other pertinent events  9/3 admit  Interim History / Subjective:  PCCM called again reference to progressively low BP this afternoon.   Pt denies any complaints other than 2 episodes of large volume liquid brown diarrhea per pt.  No pain, abd pain, further nausea.  Drinking well today and ate breakfast but not lunch.   Objective:  Blood pressure (!) 89/53, pulse (!) 106, temperature 99.3 F (37.4 C), temperature source Oral, resp. rate (!) 22, height 5\' 10"  (1.778 m), weight 119.5 kg, SpO2 97%.        Intake/Output Summary (Last 24 hours) at 05/24/2023 1813 Last data filed at 05/24/2023 0330 Gross per 24 hour  Intake 2100 ml  Output --  Net 2100 ml   Filed Weights   05/23/23 2022  Weight: 119.5 kg    Examination: General:  Well nourished appearing adult male lying in ER stretcher in NAD, sleeping on entry into room.  Wife at bedside HEENT: MM pale/slightly dry Neuro: wakes easily, oriented x 4, MAE CV: rr, ST 110s, LUE AVF site +b/t, no tenderness/ erythema PULM:  non labored, clear- diminished in bases, on room air GI: soft, bs hyperactive, NT/ ND, does void occasionally- not here though Extremities: warm/dry, no LE edema  Skin: no rashes   Labs reviewed  Tmax 100.3 (101.2 initially)  Labs/imaging personally reviewed:  CT abd/pelv > 9/3 right L4-L5 facet joint arthritis, multiple pulmonary nodules with surrounding GGO, thick walled GB.  CT L spine 9/3 > negative for acute processes.  Assessment & Plan:   Sepsis of unclear etiology, likely GI source - BP responding to fluids.  Lactic improved earlier with fluids - ? Septic pulmonary emboli on CT abd/pelv P:  - question accuracy of some lower BP as pt has R arm w/ BP cuff resting above his head at times which will cause false low BP readings.  Currently MAP still > 65 - will monitor closely in ICU overnight - echo earlier with hyperdynamic function, EF 70-75%, no RWA, normal RV/ valves,  collapsed IVC c/w hypovolemia; no evidence of vegetation.  Consider TEE pending blood cultures  - ongoing GI losses throughout the day with watery diarrhea.  Denies hx of diarrhea after recent cholecystomy in February  - no obvious acute abnormalities on CT abd/ pelvis.   - LR bolus 500 ml x 1, likely to repeat pending response.  Discussed with nephrology - stop MIVF - send stool for GI panel, cdiff, and O/P w/contact precautions.  Denies any recent abx exposures, sick contacts and V/D started last evening.  Able to keep down PO fluids today  - cont empiric vanc, switch cefepime to zosyn for GI coverage - follow blood cultures, send UA when able - trend CBC/ fever curve  - will need f/u chest imaging to ensure resolution of pulmonary nodules.  No resp issues or current O2 needs.    Neutropenia - presumed 2/2 sepsis. - Supportive care as above - trend CBC   ESRD on home HD - on home HD for 4 years.  Dry weight 116kg.   - recorded wt here 119kg but estimated> denies being weighed.  Last home HD session Saturday/ full treatment.  Wt at home Monday 118kg.  Access LUE AVF> site wnl.  Self cath's per request.  Makes some urine at times.  - Nephrology following, plans for iHD today - add oral bicarb x 3 doses here to help with GI losses - trend renal indices  - strict I/Os, daily wts - renal dose meds,  hemodynamic support as above   Chest pain - resolved. Initial EKG A.flutter but currently sinus tach on exam. Trop negative.   - no RWA on TTE.  No further CP  Transaminitis, mild - hold crestor.  T.bili elevated > likely due to hypotension/ hypovolemia.  No acute abnormalities on CT a/p - recheck LFTs in am  HTN - normal SBP 120-150 - cont to hold amlodipine, coreg, and losartan with hypotension   DMT2 - CBG q 4 w/ SSI prn  - A1c in am, previous A1c 7 in February  - hold ozempic   Chronic microcytic anemia of chronic disease - Hgb stable, no evidence of bleeding - trend  CBC  PCCM will monitor closely overnight in ICU for worsening hypotension/ vasopressor needs. Needs volume for now with ongoing diarrhea and evidence of hypovolemia.  TRH to remain primary for now.   Best practice (evaluated daily):  Per primary team  Labs   CBC: Recent Labs  Lab 05/23/23 2044 05/23/23 2155 05/24/23 0218  WBC 0.6* 1.6* 3.4*  NEUTROABS  --  1.3*  --   HGB 10.8* 9.6* 9.4*  HCT 33.2* 29.4* 29.0*  MCV 84.1 84.2 85.5  PLT 212 182 171    Basic Metabolic Panel: Recent Labs  Lab 05/23/23 2044 05/24/23 0218 05/24/23 1145  NA 139 136 135  K 4.8 3.4* 3.9  CL 95* 97* 96*  CO2 24 20* 19*  GLUCOSE 142* 226* 146*  BUN 54* 58* 71*  CREATININE 10.40* 11.74* 12.66*  CALCIUM 9.4 8.4* 8.0*   GFR: Estimated Creatinine Clearance: 9 mL/min (A) (by C-G formula based on SCr of 12.66 mg/dL (H)). Recent Labs  Lab 05/23/23 2044 05/23/23 2155 05/24/23 0027 05/24/23 0218 05/24/23 0219 05/24/23 0411 05/24/23 1140  WBC 0.6* 1.6*  --  3.4*  --   --   --   LATICACIDVEN  --   --  6.6*  --  7.6* 5.8* 4.2*    Liver Function Tests: Recent Labs  Lab 05/24/23 0218 05/24/23 1145  AST 158* 121*  ALT 147* 152*  ALKPHOS 124 121  BILITOT 1.8* 2.6*  PROT 6.2* 6.5  ALBUMIN 2.9* 2.9*   No results for input(s): "LIPASE", "AMYLASE" in the last 168 hours. No results for input(s): "AMMONIA" in the last 168 hours.  ABG    Component Value Date/Time   HCO3 27.8 (H) 07/03/2007 0036   TCO2 28 10/25/2022 0610     Coagulation Profile: Recent Labs  Lab 05/23/23 2052  INR 1.2    Cardiac Enzymes: No results for input(s): "CKTOTAL", "CKMB", "CKMBINDEX", "TROPONINI" in the last 168 hours.  HbA1C: Hgb A1c MFr Bld  Date/Time Value Ref Range Status  10/22/2022 08:10 AM 7.0 (H) 4.8 - 5.6 % Final    Comment:    (NOTE) Pre diabetes:          5.7%-6.4%  Diabetes:              >6.4%  Glycemic control for   <7.0% adults with diabetes     CBG: Recent Labs  Lab  05/23/23 2037  GLUCAP 141*   Allergies No Known Allergies   Home Medications  Prior to Admission medications   Medication Sig Start Date End Date Taking? Authorizing Provider  amitriptyline (ELAVIL) 25 MG tablet Take by mouth. 02/16/23   [provider]  amLODipine (NORVASC) 10 MG tablet Take 10 mg by mouth daily.    [provider]  aspirin EC 81 MG tablet Take 81 mg  by mouth daily.     [provider]  calcitRIOL (ROCALTROL) 0.5 MCG capsule Take 0.5 mcg by mouth 2 (two) times a week.    [provider]  carvedilol (COREG) 12.5 MG tablet Take 12.5-25 mg by mouth See admin instructions. 25 mg twice daily on non treatment days. 12.5-25 mg in the evening after treatment as needed for SBP > 120.    [provider]  ferric citrate (AURYXIA) 1 GM 210 MG(Fe) tablet Take 420 mg by mouth 3 (three) times daily with meals.    [provider]  heparin sodium, porcine, 1000 UNIT/ML injection Inject 2,000 Units into the vein See admin instructions. 2000 units pre treatment (2 days on, 1 day off) 02/04/20   [provider]  HYDROcodone-acetaminophen (NORCO) 5-325 MG tablet 1-2 tabs po q6 hours prn pain 12/30/22   Betha Loa, MD  losartan (COZAAR) 50 MG tablet Take 50 mg by mouth daily. 10/02/19   [provider]  Methoxy PEG-Epoetin Beta (MIRCERA IJ) Inject 1 Dose into the skin See admin instructions. Inject dose after every treatment (2 days on, 1 day off) 07/27/21   [provider]  rosuvastatin (CRESTOR) 5 MG tablet Take 5 mg by mouth daily.  08/20/18   [provider]  Semaglutide (OZEMPIC, 0.25 OR 0.5 MG/DOSE, [place]) Inject 0.5 mg into the skin every Friday.    [provider]     Critical care time:  35 mins      Posey Boyer, MSN, AG-ACNP-BC [facility] 05/24/2023, 6:13 PM  See Amion for pager If no response to pager , please call [phone] until 7pm After 7:00 pm call Elink   [phone]

## 2023-05-24 NOTE — ED Notes (Signed)
ED TO INPATIENT HANDOFF REPORT  ED Nurse Name and Phone #: Virgilio Belling Name/Age/Gender Ray Bradley 52 y.o. male Room/Bed: 005C/005C  Code Status   Code Status: Full Code  Home/SNF/Other Home Patient oriented to: self, place, time, and situation Is this baseline? Yes   Triage Complete: Triage complete  Chief Complaint Sepsis (HCC) [A41.9]  Triage Note Pt BIBEMS from home w/ c/o chest pain lower back & jaw pain. As per report sx started during dialysis at home. N/V & coughing on arrival in ER.   Allergies No Known Allergies  Level of Care/Admitting Diagnosis ED Disposition     ED Disposition  Admit   Condition  --   Comment  Hospital Area: [facility] [[id number]]  Level of Care: Progressive [102]  Admit to Progressive based on following criteria: CARDIOVASCULAR & THORACIC of moderate stability with acute coronary syndrome symptoms/low risk myocardial infarction/hypertensive urgency/arrhythmias/heart failure potentially compromising stability and stable post cardiovascular intervention patients.  Admit to Progressive based on following criteria: Other see comments  Comments: Sepsis.  MAP between 65-69.  May admit patient to [facility] or [facility] if equivalent level of care is available:: No  Covid Evaluation: Asymptomatic - no recent exposure (last 10 days) testing not required  Diagnosis: Sepsis [facility]) [[id number]]  Admitting Physician: Tereasa Coop [[id number]]  Attending Physician: Tereasa Coop [[id number]]  Certification:: I certify this patient will need inpatient services for at least 2 midnights  Expected Medical Readiness: 05/28/2023          B Medical/Surgery History Past Medical History:  Diagnosis Date   Anemia    Asthma    as a child   Chest pain    Chronic kidney disease    dialysis - at home hemodialysis -does 2 days in a row with one day off   COVID    2022 - mild case   History of kidney stones    HTN (hypertension)     Hyperlipidemia    Pneumonia    as a child   Type II or unspecified type diabetes mellitus without mention of complication, not stated as uncontrolled    Wears glasses    Past Surgical History:  Procedure Laterality Date   AV FISTULA PLACEMENT Left 08/24/2019   Procedure: CREATION OF ARTERIOVENOUS (AV) FISTULA  LEFT ARM;  Surgeon: Cephus Shelling, MD;  Location: [facility] OR;  Service: Vascular;  Laterality: Left;   CARPAL TUNNEL RELEASE Left 12/30/2022   Procedure: LEFT CARPAL TUNNEL RELEASE;  Surgeon: Betha Loa, MD;  Location: [facility] OR;  Service: Orthopedics;  Laterality: Left;  30 MIN   CHOLECYSTECTOMY N/A 10/25/2022   Procedure: LAPAROSCOPIC CHOLECYSTECTOMY;  Surgeon: Diamantina Monks, MD;  Location: [facility] OR;  Service: General;  Laterality: N/A;   CIRCUMCISION, NON-NEWBORN  2004   COLONOSCOPY W/ BIOPSIES AND POLYPECTOMY     EYE SURGERY     FOOT SURGERY     IR FLUORO GUIDE CV LINE RIGHT  07/23/2019   IR FLUORO GUIDE CV LINE RIGHT  09/19/2019   IR US GUIDE VASC ACCESS RIGHT  07/23/2019   MULTIPLE TOOTH EXTRACTIONS       A IV Location/Drains/Wounds Patient Lines/Drains/Airways Status     Active Line/Drains/Airways     Name Placement date Placement time Site Days   Peripheral IV 05/23/23 20 G Right;Distal Forearm 05/23/23  2046  Forearm  1   Peripheral IV 05/23/23 20 G Anterior;Proximal;Right;Lateral Forearm 05/23/23  2154  Forearm  1  Fistula / Graft Left Arteriovenous fistula 08/24/19  1136  --  1369   Incision - 4 Ports Abdomen 1: Umbilicus 2: Mid;Upper 3: Right;Lower 4: Right;Upper 10/25/22  1148  -- 211            Intake/Output Last 24 hours  Intake/Output Summary (Last 24 hours) at 05/24/2023 1541 Last data filed at 05/24/2023 0330 Gross per 24 hour  Intake 2100 ml  Output --  Net 2100 ml    Labs/Imaging Results for orders placed or performed during the hospital encounter of 05/23/23 (from the past 48 hour(s))  CBG monitoring, ED     Status: Abnormal   Collection Time:  05/23/23  8:37 PM  Result Value Ref Range   Glucose-Capillary 141 (H) 70 - 99 mg/dL    Comment: Glucose reference range applies only to samples taken after fasting for at least 8 hours.  Basic metabolic panel     Status: Abnormal   Collection Time: 05/23/23  8:44 PM  Result Value Ref Range   Sodium 139 135 - 145 mmol/L   Potassium 4.8 3.5 - 5.1 mmol/L    Comment: HEMOLYSIS AT THIS LEVEL MAY AFFECT RESULT   Chloride 95 (L) 98 - 111 mmol/L   CO2 24 22 - 32 mmol/L   Glucose, Bld 142 (H) 70 - 99 mg/dL    Comment: Glucose reference range applies only to samples taken after fasting for at least 8 hours.   BUN 54 (H) 6 - 20 mg/dL   Creatinine, Ser 03.47 (H) 0.61 - 1.24 mg/dL   Calcium 9.4 8.9 - 42.5 mg/dL   GFR, Estimated 6 (L) >60 mL/min    Comment: (NOTE) Calculated using the CKD-EPI Creatinine Equation (2021)    Anion gap 20 (H) 5 - 15    Comment: Performed at [facility], [address] [place], [place] [address]  CBC     Status: Abnormal   Collection Time: 05/23/23  8:44 PM  Result Value Ref Range   WBC 0.6 (LL) 4.0 - 10.5 K/uL    Comment: REPEATED TO VERIFY THIS CRITICAL RESULT HAS VERIFIED AND BEEN CALLED TO S JOHNSON RN BY JALEESA WHITE ON 09 02 2024 AT 2118, AND HAS BEEN READ BACK. GOING TO DRAW ANOTHER TO ENSURE RESULTS    RBC 3.95 (L) 4.22 - 5.81 MIL/uL   Hemoglobin 10.8 (L) 13.0 - 17.0 g/dL   HCT 75.6 (L) 43.3 - 29.5 %   MCV 84.1 80.0 - 100.0 fL   MCH 27.3 26.0 - 34.0 pg   MCHC 32.5 30.0 - 36.0 g/dL   RDW 18.8 41.6 - 60.6 %   Platelets 212 150 - 400 K/uL   nRBC 3.1 (H) 0.0 - 0.2 %    Comment: Performed at [facility], [address], [place], [place] [address]  Troponin I (High Sensitivity)     Status: None   Collection Time: 05/23/23  8:44 PM  Result Value Ref Range   Troponin I (High Sensitivity) 10 <18 ng/L    Comment: (NOTE) Elevated high sensitivity troponin I (hsTnI) values and significant  changes across serial measurements may suggest ACS but  many other  chronic and acute conditions are known to elevate hsTnI results.  Refer to the "Links" section for chest pain algorithms and additional  guidance. Performed at [facility], [address] [place], [place] [address]   Brain natriuretic peptide     Status: None   Collection Time: 05/23/23  8:44 PM  Result Value Ref Range   B Natriuretic Peptide 25.9 0.0 - 100.0 pg/mL    Comment: Performed at [facility], [address], [place], [place] [address]  Resp panel by RT-PCR (RSV, Flu A&B, Covid) Anterior Nasal Swab     Status: None   Collection Time: 05/23/23  8:52 PM   Specimen: Anterior Nasal Swab  Result Value Ref Range   SARS Coronavirus 2 by RT PCR NEGATIVE NEGATIVE   Influenza A by PCR NEGATIVE NEGATIVE   Influenza B by PCR NEGATIVE NEGATIVE    Comment: (NOTE) The Xpert Xpress SARS-CoV-2/FLU/RSV plus assay is intended as an aid in the diagnosis of influenza from Nasopharyngeal swab specimens and should not be used as a sole basis for treatment. Nasal washings and aspirates are unacceptable for Xpert Xpress SARS-CoV-2/FLU/RSV testing.  Fact Sheet for Patients: BloggerCourse.com  Fact Sheet for Healthcare Providers: SeriousBroker.it  This test is not yet approved or cleared by the [place] FDA and has been authorized for detection and/or diagnosis of SARS-CoV-2 by FDA under an Emergency Use Authorization (EUA). This EUA will remain in effect (meaning this test can be used) for the duration of the COVID-19 declaration under Section 564(b)(1) of the Act, 21 U.S.C. section 360bbb-3(b)(1), unless the authorization is terminated or revoked.     Resp Syncytial Virus by PCR NEGATIVE NEGATIVE    Comment: (NOTE) Fact Sheet for Patients: BloggerCourse.com  Fact Sheet for Healthcare Providers: SeriousBroker.it  This test is not yet approved or cleared by  the [place] FDA and has been authorized for detection and/or diagnosis of SARS-CoV-2 by FDA under an Emergency Use Authorization (EUA). This EUA will remain in effect (meaning this test can be used) for the duration of the COVID-19 declaration under Section 564(b)(1) of the Act, 21 U.S.C. section 360bbb-3(b)(1), unless the authorization is terminated or revoked.  Performed at [facility], [address] [place], [place] [address]   Protime-INR     Status: Abnormal   Collection Time: 05/23/23  8:52 PM  Result Value Ref Range   Prothrombin Time 15.3 (H) 11.4 - 15.2 seconds   INR 1.2 0.8 - 1.2    Comment: (NOTE) INR goal varies based on device and disease states. Performed at [facility], [address] [place], [place] [address]   APTT     Status: None   Collection Time: 05/23/23  8:52 PM  Result Value Ref Range   aPTT 27 24 - 36 seconds    Comment: Performed at [facility], [address] [place], [place] [address]  Blood Culture (routine x 2)     Status: None (Preliminary result)   Collection Time: 05/23/23  8:52 PM   Specimen: BLOOD  Result Value Ref Range   Specimen Description BLOOD SITE NOT SPECIFIED    Special Requests      BOTTLES DRAWN AEROBIC AND ANAEROBIC Blood Culture results may not be optimal due to an inadequate volume of blood received in culture bottles   Culture      NO GROWTH < 12 HOURS Performed at [facility], [address], [place], [place] [address]    Report Status PENDING   Blood Culture (routine x 2)     Status: None (Preliminary result)   Collection Time: 05/23/23  8:55 PM   Specimen: BLOOD  Result Value Ref Range   Specimen Description BLOOD SITE NOT SPECIFIED    Special Requests  BOTTLES DRAWN AEROBIC AND ANAEROBIC Blood Culture results may not be optimal due to an inadequate volume of blood received in culture bottles   Culture      NO GROWTH < 12 HOURS Performed at [facility], [address] [place], [place] [address]    Report Status PENDING   CBC with Differential/Platelet     Status: Abnormal   Collection Time: 05/23/23  9:55 PM  Result Value Ref Range   WBC 1.6 (L) 4.0 - 10.5 K/uL   RBC 3.49 (L) 4.22 - 5.81 MIL/uL   Hemoglobin 9.6 (L) 13.0 - 17.0 g/dL   HCT 60.4 (L) 54.0 - 98.1 %   MCV 84.2 80.0 - 100.0 fL   MCH 27.5 26.0 - 34.0 pg   MCHC 32.7 30.0 - 36.0 g/dL   RDW 19.1 47.8 - 29.5 %   Platelets 182 150 - 400 K/uL   nRBC 1.9 (H) 0.0 - 0.2 %   Neutrophils Relative % 81 %   Neutro Abs 1.3 (L) 1.7 - 7.7 K/uL   Lymphocytes Relative 16 %   Lymphs Abs 0.3 (L) 0.7 - 4.0 K/uL   Monocytes Relative 1 %   Monocytes Absolute 0.0 (L) 0.1 - 1.0 K/uL   Eosinophils Relative 0 %   Eosinophils Absolute 0.0 0.0 - 0.5 K/uL   Basophils Relative 1 %   Basophils Absolute 0.0 0.0 - 0.1 K/uL   Immature Granulocytes 1 %   Abs Immature Granulocytes 0.02 0.00 - 0.07 K/uL    Comment: Performed at [facility], [address] [place], [place] [address]  Troponin I (High Sensitivity)     Status: Abnormal   Collection Time: 05/24/23 12:21 AM  Result Value Ref Range   Troponin I (High Sensitivity) 23 (H) <18 ng/L    Comment: (NOTE) Elevated high sensitivity troponin I (hsTnI) values and significant  changes across serial measurements may suggest ACS but many other  chronic and acute conditions are known to elevate hsTnI results.  Refer to the "Links" section for chest pain algorithms and additional  guidance. Performed at [facility], [address], [place], [place] [address]   I-Stat Lactic Acid, ED     Status: Abnormal   Collection Time: 05/24/23 12:27 AM  Result Value Ref Range   Lactic Acid, Venous 6.6 (HH) 0.5 - 1.9 mmol/L   Comment NOTIFIED PHYSICIAN   Troponin I (High Sensitivity)     Status: Abnormal   Collection Time: 05/24/23  2:18 AM  Result Value Ref Range   Troponin I (High Sensitivity) 42 (H) <18 ng/L    Comment: RESULT CALLED TO, READ BACK BY AND  VERIFIED WITH BRANCH, K. PARAMEDIC, @ 0330 05/24/23 JBUTLER (NOTE) Elevated high sensitivity troponin I (hsTnI) values and significant  changes across serial measurements may suggest ACS but many other  chronic and acute conditions are known to elevate hsTnI results.  Refer to the "Links" section for chest pain algorithms and additional  guidance. Performed at [facility], [address] [place], [place] [address]   CBC     Status: Abnormal   Collection Time: 05/24/23  2:18 AM  Result Value Ref Range   WBC 3.4 (L) 4.0 - 10.5 K/uL   RBC 3.39 (L) 4.22 - 5.81 MIL/uL   Hemoglobin 9.4 (L) 13.0 - 17.0 g/dL   HCT 95.2 (L) 84.1 - 32.4 %   MCV 85.5 80.0 - 100.0 fL   MCH 27.7 26.0 -  34.0 pg   MCHC 32.4 30.0 - 36.0 g/dL   RDW 19.1 47.8 - 29.5 %   Platelets 171 150 - 400 K/uL   nRBC 1.2 (H) 0.0 - 0.2 %    Comment: Performed at [facility], [address] [place], [place] [address]  Comprehensive metabolic panel     Status: Abnormal   Collection Time: 05/24/23  2:18 AM  Result Value Ref Range   Sodium 136 135 - 145 mmol/L   Potassium 3.4 (L) 3.5 - 5.1 mmol/L   Chloride 97 (L) 98 - 111 mmol/L   CO2 20 (L) 22 - 32 mmol/L   Glucose, Bld 226 (H) 70 - 99 mg/dL    Comment: Glucose reference range applies only to samples taken after fasting for at least 8 hours.   BUN 58 (H) 6 - 20 mg/dL   Creatinine, Ser 86.57 (H) 0.61 - 1.24 mg/dL   Calcium 8.4 (L) 8.9 - 10.3 mg/dL   Total Protein 6.2 (L) 6.5 - 8.1 g/dL   Albumin 2.9 (L) 3.5 - 5.0 g/dL   AST 846 (H) 15 - 41 U/L   ALT 147 (H) 0 - 44 U/L   Alkaline Phosphatase 124 38 - 126 U/L   Total Bilirubin 1.8 (H) 0.3 - 1.2 mg/dL   GFR, Estimated 5 (L) >60 mL/min    Comment: (NOTE) Calculated using the CKD-EPI Creatinine Equation (2021)    Anion gap 19 (H) 5 - 15    Comment: Performed at [facility], [address], [place], [place] [address]  Lactic acid, plasma     Status: Abnormal   Collection Time: 05/24/23  2:19 AM  Result  Value Ref Range   Lactic Acid, Venous 7.6 (HH) 0.5 - 1.9 mmol/L    Comment: CRITICAL RESULT CALLED TO, READ BACK BY AND VERIFIED WITH BRANCH, K. PARAMEDIC, @ 0330 05/24/23 JBUTLER Performed at [facility], [address], [place], [place] [address]   Lactic acid, plasma     Status: Abnormal   Collection Time: 05/24/23  4:11 AM  Result Value Ref Range   Lactic Acid, Venous 5.8 (HH) 0.5 - 1.9 mmol/L    Comment: CRITICAL VALUE NOTED. VALUE IS CONSISTENT WITH PREVIOUSLY REPORTED/CALLED VALUE Performed at [facility], [address], [place], [place] [address]   Lactic acid, plasma     Status: Abnormal   Collection Time: 05/24/23 11:40 AM  Result Value Ref Range   Lactic Acid, Venous 4.2 (HH) 0.5 - 1.9 mmol/L    Comment: CRITICAL VALUE NOTED. VALUE IS CONSISTENT WITH PREVIOUSLY REPORTED/CALLED VALUE Performed at [facility], [address] [place], [place] [address]   Comprehensive metabolic panel     Status: Abnormal   Collection Time: 05/24/23 11:45 AM  Result Value Ref Range   Sodium 135 135 - 145 mmol/L   Potassium 3.9 3.5 - 5.1 mmol/L   Chloride 96 (L) 98 - 111 mmol/L   CO2 19 (L) 22 - 32 mmol/L   Glucose, Bld 146 (H) 70 - 99 mg/dL    Comment: Glucose reference range applies only to samples taken after fasting for at least 8 hours.   BUN 71 (H) 6 - 20 mg/dL   Creatinine, Ser 36.64 (H) 0.61 - 1.24 mg/dL   Calcium 8.0 (L) 8.9 - 10.3 mg/dL   Total Protein 6.5 6.5 - 8.1 g/dL   Albumin 2.9 (L) 3.5 - 5.0 g/dL   AST 403 (H) 15 -  41 U/L   ALT 152 (H) 0 - 44 U/L   Alkaline Phosphatase 121 38 - 126 U/L   Total Bilirubin 2.6 (H) 0.3 - 1.2 mg/dL   GFR, Estimated 4 (L) >60 mL/min    Comment: (NOTE) Calculated using the CKD-EPI Creatinine Equation (2021)    Anion gap 20 (H) 5 - 15    Comment: Performed at [facility], [address], [place], [place] [address]   ECHOCARDIOGRAM COMPLETE  Result Date: 05/24/2023    ECHOCARDIOGRAM REPORT   Patient Name:    ROMMIE ZANGHI Date of Exam: 05/24/2023 Medical Rec #:  [id number]       Height:       70.0 in Accession #:    [id number]      Weight:       263.4 lb Date of Birth:  October 18, 1970      BSA:          2.347 m Patient Age:    51 years        BP:           97/86 mmHg Patient Gender: M               HR:           111 bpm. Exam Location:  Inpatient Procedure: 2D Echo, Cardiac Doppler, Color Doppler and Intracardiac            Opacification Agent Indications:    A fib  History:        Patient has no prior history of Echocardiogram examinations.                 Arrythmias:Atrial Fibrillation, Signs/Symptoms:septic shock;                 Risk Factors:Hypertension and Dyslipidemia.  Sonographer:    Melissa Morford RDCS (AE, PE) Referring Phys: [id number] SUBRINA SUNDIL IMPRESSIONS  1. Left ventricular ejection fraction, by estimation, is 70 to 75%. The left ventricle has hyperdynamic function. The left ventricle has no regional wall motion abnormalities. Left ventricular diastolic parameters are consistent with Grade I diastolic dysfunction (impaired relaxation).  2. Right ventricular systolic function is normal. The right ventricular size is normal.  3. The mitral valve is normal in structure. No evidence of mitral valve regurgitation. No evidence of mitral stenosis.  4. The aortic valve is normal in structure. Aortic valve regurgitation is not visualized. No aortic stenosis is present.  5. The inferior vena cava The infferior vena cava is collapsed, consistent with low left atrial pressure. Conclusion(s)/Recommendation(s): The study suggests relative hypovolemia. Rhythm during the study was sinus tachycardia. FINDINGS  Left Ventricle: Left ventricular ejection fraction, by estimation, is 70 to 75%. The left ventricle has hyperdynamic function. The left ventricle has no regional wall motion abnormalities. The left ventricular internal cavity size was normal in size. There is no left ventricular hypertrophy. Left ventricular  diastolic parameters are consistent with Grade I diastolic dysfunction (impaired relaxation). Right Ventricle: The right ventricular size is normal. No increase in right ventricular wall thickness. Right ventricular systolic function is normal. Left Atrium: Left atrial size was normal in size. Right Atrium: Right atrial size was normal in size. Pericardium: There is no evidence of pericardial effusion. Mitral Valve: The mitral valve is normal in structure. No evidence of mitral valve regurgitation. No evidence of mitral valve stenosis. Tricuspid Valve: The tricuspid valve is normal in structure. Tricuspid valve regurgitation is not demonstrated. No evidence of tricuspid stenosis. Aortic  Valve: The aortic valve is normal in structure. Aortic valve regurgitation is not visualized. No aortic stenosis is present. Pulmonic Valve: The pulmonic valve was normal in structure. Pulmonic valve regurgitation is not visualized. No evidence of pulmonic stenosis. Aorta: The aortic root is normal in size and structure. Venous: The inferior vena cava The infferior vena cava is collapsed, consistent with low left atrial pressure. IAS/Shunts: No atrial level shunt detected by color flow Doppler.  LEFT VENTRICLE PLAX 2D LVIDd:         4.70 cm   Diastology LVIDs:         3.60 cm   LV e' medial:    8.38 cm/s LV PW:         1.10 cm   LV E/e' medial:  6.5 LV IVS:        1.00 cm   LV e' lateral:   11.40 cm/s LVOT diam:     2.20 cm   LV E/e' lateral: 4.8 LV SV:         85 LV SV Index:   36 LVOT Area:     3.80 cm  RIGHT VENTRICLE RV S prime:     11.30 cm/s TAPSE (M-mode): 1.7 cm LEFT ATRIUM             Index        RIGHT ATRIUM           Index LA diam:        4.00 cm 1.70 cm/m   RA Area:     15.00 cm LA Vol (A2C):   67.1 ml 28.59 ml/m  RA Volume:   38.10 ml  16.24 ml/m LA Vol (A4C):   61.4 ml 26.16 ml/m LA Biplane Vol: 64.3 ml 27.40 ml/m  AORTIC VALVE LVOT Vmax:   110.00 cm/s LVOT Vmean:  84.000 cm/s LVOT VTI:    0.224 m  AORTA Ao Root  diam: 3.40 cm MITRAL VALVE MV Area (PHT): 6.32 cm    SHUNTS MV Decel Time: 120 msec    Systemic VTI:  0.22 m MV E velocity: 54.70 cm/s  Systemic Diam: 2.20 cm MV A velocity: 74.60 cm/s MV E/A ratio:  0.73 Mihai Croitoru MD Electronically signed by Thurmon Fair MD Signature Date/Time: 05/24/2023/10:30:49 AM    Final    CT L-SPINE NO CHARGE  Result Date: 05/24/2023 CLINICAL DATA:  Insert Osteomyelitis, lumbar Lumbar 4 to lumbar 5 vertebra septic arthritis EXAM: CT LUMBAR SPINE WITHOUT CONTRAST TECHNIQUE: Multidetector CT imaging of the lumbar spine was performed without intravenous contrast administration. Multiplanar CT image reconstructions were also generated. RADIATION DOSE REDUCTION: This exam was performed according to the departmental dose-optimization program which includes automated exposure control, adjustment of the mA and/or kV according to patient size and/or use of iterative reconstruction technique. COMPARISON:  CT abdomen and pelvis today and 10/21/2022. FINDINGS: Segmentation: 5 lumbar type vertebrae. Alignment: Normal Vertebrae: No acute fracture or focal pathologic process. Paraspinal and other soft tissues: Normal Disc levels: Disc spaces maintained. Mild right facet arthropathy with erosions on the right at L4-5 as seen on earlier study. This is stable when compared to prior CT from February of this year. This is most compatible with facet arthritis. No evidence of infection. IMPRESSION: No acute bony abnormality. No evidence of discitis or osteomyelitis by CT. Degenerative right facet arthropathy at L4-5 unchanged since prior study. Electronically Signed   By: Charlett Nose M.D.   On: 05/24/2023 00:44   CT ABDOMEN PELVIS WO CONTRAST  Result Date: 05/23/2023 CLINICAL DATA:  Sepsis.  Lower back pain. EXAM: CT ABDOMEN AND PELVIS WITHOUT CONTRAST TECHNIQUE: Multidetector CT imaging of the abdomen and pelvis was performed following the standard protocol without IV contrast. RADIATION DOSE  REDUCTION: This exam was performed according to the departmental dose-optimization program which includes automated exposure control, adjustment of the mA and/or kV according to patient size and/or use of iterative reconstruction technique. COMPARISON:  MRI abdomen 10/22/2022 and CT abdomen and pelvis 10/21/2022 FINDINGS: Lower chest: Multiple new pulmonary nodules are present in the lung bases. For example in the left lower lobe posteriorly on series 3/image 17 measuring 7 mm. Additional nodules are present in the right lower lobe on 3/10 and 3/7 and right middle lobe on 3/5. There is mild ground-glass opacity about each nodule. Hepatobiliary: Cholecystectomy. Unremarkable noncontrast appearance of the liver. No biliary dilation. Pancreas: Unremarkable. Spleen: Unremarkable. Adrenals/Urinary Tract: Stable adrenal glands. No urinary calculi or hydronephrosis. Nondistended thick-walled bladder. Stomach/Bowel: Normal caliber large and small bowel. No bowel wall thickening. The appendix is normal.Stomach is within normal limits. Vascular/Lymphatic: Aortic atherosclerosis. No enlarged abdominal or pelvic lymph nodes. Reproductive: Unremarkable. Other: No free intraperitoneal fluid or air. Musculoskeletal: No acute fracture. Right L4-L5 facet arthritis with mild erosive change. IMPRESSION: 1. Right L4-L5 facet joint arthritis with mild erosive change. Correlation with site of pain is recommended. If there is concern for septic arthritis, MRI is recommended. 2. Multiple pulmonary nodules are present bilaterally measuring up to 7 mm with surrounding ground-glass opacity. This is nonspecific and can be seen in a variety of infectious, neoplastic, and inflammatory conditions including septic emboli. Consider dedicated CT chest for further evaluation. 3. Nondistended thick-walled bladder. Correlate with urinalysis. Aortic Atherosclerosis (ICD10-I70.0). Electronically Signed   By: Minerva Fester M.D.   On: 05/23/2023 23:20    DG Chest Port 1 View  Result Date: 05/23/2023 CLINICAL DATA:  Questionable sepsis - evaluate for abnormality EXAM: PORTABLE CHEST - 1 VIEW COMPARISON:  11/19/2013 FINDINGS: Low lung volumes. Mild perihilar and bibasilar interstitial prominence. No confluent airspace disease. Heart size and mediastinal contours are within normal limits. No effusion. Visualized bones unremarkable. IMPRESSION: Low lung volumes with mild perihilar and bibasilar interstitial prominence. Electronically Signed   By: Corlis Leak M.D.   On: 05/23/2023 21:32    Pending Labs Unresulted Labs (From admission, onward)     Start     Ordered   05/24/23 1249  Hepatitis B surface antigen  (New Admission Hemo Labs (Hepatitis B))  Once,   R        05/24/23 1258   05/24/23 1249  Hepatitis B surface antibody,quantitative  (New Admission [facility]o Labs (Hepatitis B))  Once,   R        05/24/23 1258   05/24/23 0500  CBC  Daily,   R      05/24/23 0057   05/24/23 0500  Comprehensive metabolic panel  Daily,   R      05/24/23 0057   05/24/23 0046  Expectorated Sputum Assessment w Gram Stain, Rflx to Resp Cult  Once,   R       Question Answer Comment  Patient immune status Immunocompromised   Release to patient Immediate      05/24/23 0045   05/23/23 2049  Urinalysis, w/ Reflex to Culture (Infection Suspected) -Urine, Clean Catch  (Septic presentation on arrival (screening labs, nursing and treatment orders for obvious sepsis))  ONCE - URGENT,   URGENT       Question:  Specimen Source  Answer:  Urine, Clean Catch   05/23/23 2049            Vitals/Pain Today's Vitals   05/24/23 1351 05/24/23 1351 05/24/23 1415 05/24/23 1530  BP:   (!) 132/115 (!) 92/52  Pulse:   (!) 104 (!) 103  Resp:   20 (!) 22  Temp: 98.7 F (37.1 C)     TempSrc: Oral     SpO2:   96% 97%  Weight:      Height:      PainSc:  0-No pain      Isolation Precautions No active isolations  Medications Medications  oxyCODONE-acetaminophen  (PERCOCET/ROXICET) 5-325 MG per tablet 1 tablet (1 tablet Oral Given 05/24/23 1125)  rosuvastatin (CRESTOR) tablet 5 mg (5 mg Oral Given 05/24/23 1117)  ferric citrate (AURYXIA) tablet 420 mg (420 mg Oral Given 05/24/23 1349)  acetaminophen (TYLENOL) tablet 650 mg (650 mg Oral Given 05/24/23 1226)    Or  acetaminophen (TYLENOL) suppository 650 mg ( Rectal See Alternative 05/24/23 1226)  ondansetron (ZOFRAN) tablet 4 mg (has no administration in time range)    Or  ondansetron (ZOFRAN) injection 4 mg (has no administration in time range)  senna-docusate (Senokot-S) tablet 1 tablet (has no administration in time range)  heparin injection 5,000 Units (5,000 Units Subcutaneous Given 05/24/23 1117)  ceFEPIme (MAXIPIME) 1 g in sodium chloride 0.9 % 100 mL IVPB (has no administration in time range)  lactated ringers infusion ( Intravenous New Bag/Given 05/24/23 1348)  perflutren lipid microspheres (DEFINITY) IV suspension (2 mLs Intravenous Given 05/24/23 0952)  Chlorhexidine Gluconate Cloth 2 % PADS 6 each (has no administration in time range)  vancomycin (VANCOREADY) IVPB 1250 mg/250 mL (has no administration in time range)  ceFEPIme (MAXIPIME) 2 g in sodium chloride 0.9 % 100 mL IVPB (0 g Intravenous Stopped 05/23/23 2249)  metroNIDAZOLE (FLAGYL) IVPB 500 mg (0 mg Intravenous Stopped 05/23/23 2249)  vancomycin (VANCOCIN) IVPB 1000 mg/200 mL premix (0 mg Intravenous Stopped 05/23/23 2258)    Followed by  vancomycin (VANCOCIN) IVPB 1000 mg/200 mL premix (0 mg Intravenous Stopped 05/24/23 0012)  acetaminophen (TYLENOL) tablet 1,000 mg (1,000 mg Oral Given 05/23/23 2217)  lactated ringers bolus 1,000 mL (0 mLs Intravenous Stopped 05/24/23 0013)  lactated ringers bolus 500 mL (0 mLs Intravenous Stopped 05/24/23 0330)    Mobility walks     Focused Assessments Renal Assessment Handoff:  Hemodialysis Schedule: Done at home, 4x/week Last Hemodialysis date and time: 9/2   Restricted appendage: left  arm   R Recommendations: See Admitting Provider Note  Report given to:   Additional Notes:

## 2023-05-24 NOTE — Progress Notes (Addendum)
ANTICOAGULATION CONSULT NOTE - Initial Consult  Pharmacy Consult for heparin Indication: atrial fibrillation  No Known Allergies  Patient Measurements: Height: 5\' 10"  (177.8 cm) Weight: 119.5 kg (263 lb 7.2 oz) IBW/kg (Calculated) : 73 Heparin Dosing Weight: 99.7 kg  Vital Signs: BP: 96/51 (09/03 0030) Pulse Rate: 117 (09/03 0030)  Labs: Recent Labs    05/23/23 2044 05/23/23 2052 05/23/23 2155  HGB 10.8*  --  9.6*  HCT 33.2*  --  29.4*  PLT 212  --  182  APTT  --  27  --   LABPROT  --  15.3*  --   INR  --  1.2  --   CREATININE 10.40*  --   --   TROPONINIHS 10  --   --     Estimated Creatinine Clearance: 11 mL/min (A) (by C-G formula based on SCr of 10.4 mg/dL (H)).   Medical History: Past Medical History:  Diagnosis Date   Anemia    Asthma    as a child   Chest pain    Chronic kidney disease    dialysis - at home hemodialysis -does 2 days in a row with one day off   COVID    2022 - mild case   History of kidney stones    HTN (hypertension)    Hyperlipidemia    Pneumonia    as a child   Type II or unspecified type diabetes mellitus without mention of complication, not stated as uncontrolled    Wears glasses    Assessment: 63 yoM presented to the ED with acute onset of chest pain after malfunction with his home dialysis machine and found it was not running through the diastole and ongoing nausea/vomiting. PMH includes: HTN, ESRD, and HLD. Pharmacy has been consulted to dose heparin for atrial fibrillation with EKG showing afib RVR. Hgb 9.6, plts 182, ESRD, and no PTA anticoagulation reported.   Goal of Therapy:  Heparin level 0.3-0.7 units/ml Monitor platelets by anticoagulation protocol: Yes   Plan:  Give 3000 units bolus x 1 Start heparin infusion at 1300 units/hr Check anti-Xa level in 8 hours and daily while on heparin Continue to monitor H&H and platelets  Arabella Merles, PharmD. Clinical Pharmacist 05/24/2023 12:46 AM   ADDENDUM -Holding off on  heparin at this time given patient no has septic emboli seen in chest. Provider d/c'd the consult given concerns for development of hemorrhage. Will follow up plans for anticoagulation  Arabella Merles, PharmD. Clinical Pharmacist 05/24/2023 1:10 AM

## 2023-05-24 NOTE — Progress Notes (Signed)
Pharmacy Antibiotic Note  Ray Bradley is a 52 y.o. male admitted on 05/23/2023 with sepsis secondary to septic pulmonary emboli.  Pharmacy has been consulted for zosyn and vancomycin dosing. Patient presented with acute onset of chestpain after failure to run his dialysis at home. His home dialysis schedule is 4 times weekly at home giving 2 days ad then a 1 day break in between.   CT abdomen showed concerns for septic arthritis and multiple nonspecific pulmonary nodules >> septic emboli. CT- L spine showed no signs of osteo/discitis   WBC 3.4, fever curve trending down to 98.7   Plan: STOP cefepime/flagyl Zosyn 2.25g q8h Vancomycin to 1250mg  IV qTTS HD Monitor plans for HD Follow up signs of clinical improvement, surgical plans, LOT, de-escalation of antibiotics. Monitor WBC, fever, renal function, cultures  Height: 5\' 10"  (177.8 cm) Weight: 119.5 kg (263 lb 7.2 oz) IBW/kg (Calculated) : 73  Temp (24hrs), Avg:99.5 F (37.5 C), Min:98.2 F (36.8 C), Max:101.2 F (38.4 C)  Recent Labs  Lab 05/23/23 2044 05/23/23 2155 05/24/23 0027 05/24/23 0218 05/24/23 0219 05/24/23 0411 05/24/23 1140 05/24/23 1145  WBC 0.6* 1.6*  --  3.4*  --   --   --   --   CREATININE 10.40*  --   --  11.74*  --   --   --  12.66*  LATICACIDVEN  --   --  6.6*  --  7.6* 5.8* 4.2*  --     Estimated Creatinine Clearance: 9 mL/min (A) (by C-G formula based on SCr of 12.66 mg/dL (H)).    No Known Allergies  Microbiology results: Pending  Thank you for allowing pharmacy to be a part of this patient's care.  Delmar Landau, PharmD, BCPS 05/24/2023 7:15 PM ED Clinical Pharmacist -  [phone]

## 2023-05-25 ENCOUNTER — Other Ambulatory Visit: Payer: Self-pay

## 2023-05-25 ENCOUNTER — Inpatient Hospital Stay (HOSPITAL_COMMUNITY): Payer: Medicare Other

## 2023-05-25 DIAGNOSIS — A4153 Sepsis due to Serratia: Secondary | ICD-10-CM | POA: Diagnosis not present

## 2023-05-25 DIAGNOSIS — R6521 Severe sepsis with septic shock: Secondary | ICD-10-CM | POA: Diagnosis not present

## 2023-05-25 DIAGNOSIS — A419 Sepsis, unspecified organism: Secondary | ICD-10-CM | POA: Diagnosis not present

## 2023-05-25 LAB — COMPREHENSIVE METABOLIC PANEL
ALT: 160 U/L — ABNORMAL HIGH (ref 0–44)
AST: 104 U/L — ABNORMAL HIGH (ref 15–41)
Albumin: 2.9 g/dL — ABNORMAL LOW (ref 3.5–5.0)
Alkaline Phosphatase: 111 U/L (ref 38–126)
Anion gap: 23 — ABNORMAL HIGH (ref 5–15)
BUN: 87 mg/dL — ABNORMAL HIGH (ref 6–20)
CO2: 17 mmol/L — ABNORMAL LOW (ref 22–32)
Calcium: 7.6 mg/dL — ABNORMAL LOW (ref 8.9–10.3)
Chloride: 94 mmol/L — ABNORMAL LOW (ref 98–111)
Creatinine, Ser: 14.29 mg/dL — ABNORMAL HIGH (ref 0.61–1.24)
GFR, Estimated: 4 mL/min — ABNORMAL LOW (ref >60)
Glucose, Bld: 102 mg/dL — ABNORMAL HIGH (ref 70–99)
Potassium: 4 mmol/L (ref 3.5–5.1)
Sodium: 134 mmol/L — ABNORMAL LOW (ref 135–145)
Total Bilirubin: 3.4 mg/dL — ABNORMAL HIGH (ref 0.3–1.2)
Total Protein: 6.5 g/dL (ref 6.5–8.1)

## 2023-05-25 LAB — GASTROINTESTINAL PANEL BY PCR, STOOL (REPLACES STOOL CULTURE)

## 2023-05-25 LAB — BASIC METABOLIC PANEL
Anion gap: 16 — ABNORMAL HIGH (ref 5–15)
BUN: 49 mg/dL — ABNORMAL HIGH (ref 6–20)
CO2: 21 mmol/L — ABNORMAL LOW (ref 22–32)
Calcium: 7.8 mg/dL — ABNORMAL LOW (ref 8.9–10.3)
Chloride: 97 mmol/L — ABNORMAL LOW (ref 98–111)
Creatinine, Ser: 8.02 mg/dL — ABNORMAL HIGH (ref 0.61–1.24)
GFR, Estimated: 8 mL/min — ABNORMAL LOW (ref >60)
Glucose, Bld: 79 mg/dL (ref 70–99)
Potassium: 4 mmol/L (ref 3.5–5.1)
Sodium: 134 mmol/L — ABNORMAL LOW (ref 135–145)

## 2023-05-25 LAB — GLUCOSE, CAPILLARY
Glucose-Capillary: 95 mg/dL (ref 70–99)
Glucose-Capillary: 93 mg/dL (ref 70–99)
Glucose-Capillary: 95 mg/dL (ref 70–99)
Glucose-Capillary: 84 mg/dL (ref 70–99)
Glucose-Capillary: 83 mg/dL (ref 70–99)
Glucose-Capillary: 105 mg/dL — ABNORMAL HIGH (ref 70–99)
Glucose-Capillary: 94 mg/dL (ref 70–99)

## 2023-05-25 LAB — CBC
HCT: 27.8 % — ABNORMAL LOW (ref 39.0–52.0)
Hemoglobin: 9.4 g/dL — ABNORMAL LOW (ref 13.0–17.0)
MCH: 27.8 pg (ref 26.0–34.0)
MCHC: 33.8 g/dL (ref 30.0–36.0)
MCV: 82.2 fL (ref 80.0–100.0)
Platelets: 126 10*3/uL — ABNORMAL LOW (ref 150–400)
RBC: 3.38 MIL/uL — ABNORMAL LOW (ref 4.22–5.81)
RDW: 15.3 % (ref 11.5–15.5)
WBC: 24.1 10*3/uL — ABNORMAL HIGH (ref 4.0–10.5)
nRBC: 0 % (ref 0.0–0.2)

## 2023-05-25 LAB — MAGNESIUM
Magnesium: 1.5 mg/dL — ABNORMAL LOW (ref 1.7–2.4)
Magnesium: 1.5 mg/dL — ABNORMAL LOW (ref 1.7–2.4)

## 2023-05-25 LAB — BLOOD GAS, VENOUS
Acid-Base Excess: 2.7 mmol/L — ABNORMAL HIGH (ref 0.0–2.0)
Bicarbonate: 26.2 mmol/L (ref 20.0–28.0)
O2 Saturation: 85 %
Patient temperature: 38
pCO2, Ven: 38 mmHg — ABNORMAL LOW (ref 44–60)
pH, Ven: 7.45 — ABNORMAL HIGH (ref 7.25–7.43)
pO2, Ven: 55 mmHg — ABNORMAL HIGH (ref 32–45)

## 2023-05-25 LAB — HEPATITIS B SURFACE ANTIBODY, QUANTITATIVE: Hep B S AB Quant (Post): 8.3 m[IU]/mL — ABNORMAL LOW

## 2023-05-25 MED ORDER — TRAZODONE HCL 50 MG PO TABS
100.0000 mg | ORAL_TABLET | Freq: Every evening | ORAL | Status: DC | PRN
  Administered 2023-05-25: 100 mg via ORAL
  Filled 2023-05-25: qty 2

## 2023-05-25 MED ORDER — PEDIALYTE PO SOLN: 240.0000 mL | ORAL | Status: DC | PRN

## 2023-05-25 MED ORDER — HEPARIN SODIUM (PORCINE) 1000 UNIT/ML IJ SOLN
INTRAMUSCULAR | Status: AC
  Administered 2023-05-25: 4000 [IU]
  Filled 2023-05-25: qty 4

## 2023-05-25 MED ORDER — SODIUM CHLORIDE 0.9 % IV SOLN
1.0000 g | INTRAVENOUS | Status: DC
  Administered 2023-05-25 – 2023-05-27 (×3): 1 g via INTRAVENOUS
  Filled 2023-05-25 (×4): qty 10

## 2023-05-25 MED ORDER — HEPARIN SODIUM (PORCINE) 1000 UNIT/ML DIALYSIS: 1000.0000 [IU] | INTRAMUSCULAR | Status: DC | PRN

## 2023-05-25 MED ORDER — LACTATED RINGERS IV BOLUS
500.0000 mL | Freq: Once | INTRAVENOUS | Status: AC
  Administered 2023-05-25: 500 mL via INTRAVENOUS

## 2023-05-25 MED ORDER — HEPARIN SODIUM (PORCINE) 1000 UNIT/ML IJ SOLN: 2000.0000 [IU] | Freq: Once | INTRAMUSCULAR | Status: AC

## 2023-05-25 MED ORDER — NEPRO/CARBSTEADY PO LIQD: 237.0000 mL | ORAL | Status: DC | PRN

## 2023-05-25 MED ORDER — LIDOCAINE HCL (PF) 1 % IJ SOLN: 5.0000 mL | INTRAMUSCULAR | Status: DC | PRN

## 2023-05-25 MED ORDER — LIDOCAINE 5 % EX PTCH
1.0000 | MEDICATED_PATCH | Freq: Every day | CUTANEOUS | Status: DC | PRN
  Administered 2023-05-25 – 2023-05-27 (×3): 1 via TRANSDERMAL
  Filled 2023-05-25 (×3): qty 1

## 2023-05-25 MED ORDER — MAGNESIUM SULFATE 2 GM/50ML IV SOLN
2.0000 g | Freq: Once | INTRAVENOUS | Status: AC
  Administered 2023-05-25: 2 g via INTRAVENOUS
  Filled 2023-05-25: qty 50

## 2023-05-25 MED ORDER — ASPIRIN 81 MG PO TBEC
81.0000 mg | DELAYED_RELEASE_TABLET | Freq: Every day | ORAL | Status: DC
  Administered 2023-05-26 – 2023-05-28 (×3): 81 mg via ORAL
  Filled 2023-05-25 (×4): qty 1

## 2023-05-25 MED ORDER — LIDOCAINE-PRILOCAINE 2.5-2.5 % EX CREA: 1.0000 | TOPICAL_CREAM | CUTANEOUS | Status: DC | PRN

## 2023-05-25 MED ORDER — ANTICOAGULANT SODIUM CITRATE 4% (200MG/5ML) IV SOLN: 5.0000 mL | Status: DC | PRN

## 2023-05-25 MED ORDER — HEPARIN SODIUM (PORCINE) 1000 UNIT/ML DIALYSIS
2000.0000 [IU] | INTRAMUSCULAR | Status: AC | PRN
  Administered 2023-05-25: 2000 [IU] via INTRAVENOUS_CENTRAL

## 2023-05-25 MED ORDER — METRONIDAZOLE 500 MG/100ML IV SOLN
500.0000 mg | Freq: Two times a day (BID) | INTRAVENOUS | Status: DC
  Administered 2023-05-25 – 2023-05-28 (×6): 500 mg via INTRAVENOUS
  Filled 2023-05-25 (×6): qty 100

## 2023-05-25 MED ORDER — ALTEPLASE 2 MG IJ SOLR: 2.0000 mg | Freq: Once | INTRAMUSCULAR | Status: DC | PRN

## 2023-05-25 MED ORDER — LOPERAMIDE HCL 2 MG PO CAPS
2.0000 mg | ORAL_CAPSULE | ORAL | Status: DC | PRN
  Administered 2023-05-25 – 2023-05-26 (×4): 2 mg via ORAL
  Filled 2023-05-25 (×4): qty 1

## 2023-05-25 MED ORDER — MAGNESIUM SULFATE 4 GM/100ML IV SOLN
4.0000 g | Freq: Once | INTRAVENOUS | Status: AC
  Administered 2023-05-25: 4 g via INTRAVENOUS
  Filled 2023-05-25: qty 100

## 2023-05-25 MED ORDER — HEPARIN SODIUM (PORCINE) 1000 UNIT/ML DIALYSIS
4000.0000 [IU] | Freq: Once | INTRAMUSCULAR | Status: AC
  Filled 2023-05-25: qty 4

## 2023-05-25 MED ORDER — PENTAFLUOROPROP-TETRAFLUOROETH EX AERO: 1.0000 | INHALATION_SPRAY | CUTANEOUS | Status: DC | PRN

## 2023-05-25 NOTE — Progress Notes (Signed)
PHARMACY - PHYSICIAN COMMUNICATION CRITICAL VALUE ALERT - BLOOD CULTURE IDENTIFICATION (BCID)  Ray Bradley is an 52 y.o. male who presented to [facility] on 05/23/2023 with a chief complaint of back/jaw pain, N/V/D, and coughing.  Assessment:  Started on broad-spectrum ABX for septic emboli and broadened further to cover for GI bugs, now growing Serratia marcescens in 1 of 4 blood cx bottles.  Name of physician (or Provider) ContactedElise Benne MD  Current antibiotics: vancomycin and Zosyn  Changes to prescribed antibiotics recommended:  Could consider narrowing (typically we would use cefepime +/- metronidazole) but pt covered w/ vanc through next HD, will await further clinical assessment prior to d/cing.  Results for orders placed or performed during the hospital encounter of 05/23/23  Blood Culture ID Panel (Reflexed) (Collected: 05/23/2023  8:52 PM)  Result Value Ref Range   Enterococcus faecalis NOT DETECTED NOT DETECTED   Enterococcus Faecium NOT DETECTED NOT DETECTED   Listeria monocytogenes NOT DETECTED NOT DETECTED   Staphylococcus species NOT DETECTED NOT DETECTED   Staphylococcus aureus (BCID) NOT DETECTED NOT DETECTED   Staphylococcus epidermidis NOT DETECTED NOT DETECTED   Staphylococcus lugdunensis NOT DETECTED NOT DETECTED   Streptococcus species NOT DETECTED NOT DETECTED   Streptococcus agalactiae NOT DETECTED NOT DETECTED   Streptococcus pneumoniae NOT DETECTED NOT DETECTED   Streptococcus pyogenes NOT DETECTED NOT DETECTED   A.calcoaceticus-baumannii NOT DETECTED NOT DETECTED   Bacteroides fragilis NOT DETECTED NOT DETECTED   Enterobacterales DETECTED (A) NOT DETECTED   Enterobacter cloacae complex NOT DETECTED NOT DETECTED   Escherichia coli NOT DETECTED NOT DETECTED   Klebsiella aerogenes NOT DETECTED NOT DETECTED   Klebsiella oxytoca NOT DETECTED NOT DETECTED   Klebsiella pneumoniae NOT DETECTED NOT DETECTED   Proteus species NOT DETECTED NOT DETECTED    Salmonella species NOT DETECTED NOT DETECTED   Serratia marcescens DETECTED (A) NOT DETECTED   Haemophilus influenzae NOT DETECTED NOT DETECTED   Neisseria meningitidis NOT DETECTED NOT DETECTED   Pseudomonas aeruginosa NOT DETECTED NOT DETECTED   Stenotrophomonas maltophilia NOT DETECTED NOT DETECTED   Candida albicans NOT DETECTED NOT DETECTED   Candida auris NOT DETECTED NOT DETECTED   Candida glabrata NOT DETECTED NOT DETECTED   Candida krusei NOT DETECTED NOT DETECTED   Candida parapsilosis NOT DETECTED NOT DETECTED   Candida tropicalis NOT DETECTED NOT DETECTED   Cryptococcus neoformans/gattii NOT DETECTED NOT DETECTED   CTX-M ESBL NOT DETECTED NOT DETECTED   Carbapenem resistance IMP NOT DETECTED NOT DETECTED   Carbapenem resistance KPC NOT DETECTED NOT DETECTED   Carbapenem resistance NDM NOT DETECTED NOT DETECTED   Carbapenem resist OXA 48 LIKE NOT DETECTED NOT DETECTED   Carbapenem resistance VIM NOT DETECTED NOT DETECTED    Vernard Gambles, PharmD, BCPS  05/25/2023  1:46 AM

## 2023-05-25 NOTE — Progress Notes (Signed)
[facility] Progress Note   Subjective:   Patient seen reclining in bed, states he is feeling better and no issues with dialysis.   Objective Vitals:   05/25/23 1200 05/25/23 1215 05/25/23 1230 05/25/23 1245  BP: 109/67 117/64 122/63 124/64  Pulse: (!) 116 (!) 114 (!) 114 (!) 116  Resp: (!) 26 (!) 25 (!) 21 (!) 25  Temp:      TempSrc:      SpO2: 96% 95% 96% 97%  Weight:      Height:       Physical Exam General: Well-nourished adult male reclining in hospital bed in no acute distress.  Heart: RRR no m/r/g Lungs: Clear to auscultation bilaterally.  Abdomen: Soft, non-tender, non-distended.  Extremities: No peripheral edema.  Dialysis Access: LLAAVF (button-hole) present in good repair w/ + bruit/thrill and no warmth, edema, or erythema.   Additional Objective Labs: Basic Metabolic Panel: Recent Labs  Lab 05/24/23 0218 05/24/23 1145 05/25/23 0231  NA 136 135 134*  K 3.4* 3.9 4.0  CL 97* 96* 94*  CO2 20* 19* 17*  GLUCOSE 226* 146* 102*  BUN 58* 71* 87*  CREATININE 11.74* 12.66* 14.29*  CALCIUM 8.4* 8.0* 7.6*   Liver Function Tests: Recent Labs  Lab 05/24/23 0218 05/24/23 1145 05/25/23 0231  AST 158* 121* 104*  ALT 147* 152* 160*  ALKPHOS 124 121 111  BILITOT 1.8* 2.6* 3.4*  PROT 6.2* 6.5 6.5  ALBUMIN 2.9* 2.9* 2.9*    CBC: Recent Labs  Lab 05/23/23 2044 05/23/23 2155 05/24/23 0218 05/25/23 0231  WBC 0.6* 1.6* 3.4* 24.1*  NEUTROABS  --  1.3*  --   --   HGB 10.8* 9.6* 9.4* 9.4*  HCT 33.2* 29.4* 29.0* 27.8*  MCV 84.1 84.2 85.5 82.2  PLT 212 182 171 126*   Blood Culture    Component Value Date/Time   SDES BLOOD SITE NOT SPECIFIED 05/23/2023 2055   SPECREQUEST  05/23/2023 2055    BOTTLES DRAWN AEROBIC AND ANAEROBIC Blood Culture results may not be optimal due to an inadequate volume of blood received in culture bottles   CULT  05/23/2023 2055    NO GROWTH 2 DAYS Performed at [facility], [address], [place], [place]  [address]    REPTSTATUS PENDING 05/23/2023 2055    CBG: Recent Labs  Lab 05/24/23 2147 05/24/23 2336 05/25/23 0357 05/25/23 0747 05/25/23 1121  GLUCAP 122* 102* 95 93 95    Studies/Results: US Abdomen Limited RUQ (LIVER/GB)  Result Date: 05/25/2023 CLINICAL DATA:  Transaminitis EXAM: ULTRASOUND ABDOMEN LIMITED RIGHT UPPER QUADRANT COMPARISON:  CT abdomen pelvis 10/21/2022 FINDINGS: Gallbladder: Status post cholecystectomy Common bile duct: Diameter: 4 mm Liver: Parenchymal echogenicity: Within normal limits Contours: Subtle nodularity Lesions: None Portal vein: Patent.  Hepatopetal flow Other: None. IMPRESSION: Subtle nodularity of hepatic contour suspicious for cirrhosis. No suspicious liver lesions. Electronically Signed   By: Acquanetta Belling M.D.   On: 05/25/2023 10:10   CT CHEST WO CONTRAST  Result Date: 05/24/2023 CLINICAL DATA:  Lung nodules on abdominal CT. EXAM: CT CHEST WITHOUT CONTRAST TECHNIQUE: Multidetector CT imaging of the chest was performed following the standard protocol without IV contrast. RADIATION DOSE REDUCTION: This exam was performed according to the departmental dose-optimization program which includes automated exposure control, adjustment of the mA and/or kV according to patient size and/or use of iterative reconstruction technique. COMPARISON:  Lung bases from abdominopelvic CT yesterday no prior chest CT available. FINDINGS: Cardiovascular: The heart is upper normal  in size. No pericardial effusion. There are coronary artery calcifications. Normal caliber thoracic aorta. Mediastinum/Nodes: No mediastinal adenopathy. Prominent right axillary nodes measuring up to 12 mm. There is also an 11 mm left axillary node. Limited hilar assessment in the absence of IV contrast. No esophageal wall thickening. Lungs/Pleura: Bilateral pulmonary nodules, many of which are subpleural in location. The largest nodule is in the subpleural left lower lobe measuring 7 x 7 mm, series 4, image  58. There is mild adjacent ground-glass. There is also a benign calcified granuloma in the left lower lobe. No pleural fluid. Trachea and central airways are clear. No features of pulmonary edema. Upper Abdomen: Assessed on abdominopelvic CT yesterday. No acute findings. Musculoskeletal: Mild diffuse thoracic spondylosis with spurring and scattered Schmorl's nodes. There are no acute or suspicious osseous abnormalities. IMPRESSION: 1. Bilateral pulmonary nodules, many of which are subpleural in location. The largest nodule is in the subpleural left lower lobe measuring 7 x 7 mm. There is mild adjacent ground-glass. These are nonspecific, and may be infectious or inflammatory. Per Fleischner Society Guidelines, recommend a non-contrast Chest CT at 3-6 months, then consider another non-contrast Chest CT at 18-24 months. If patient is low risk for malignancy, non-contrast Chest CT at 18-24 months is optional. These guidelines do not apply to immunocompromised patients and patients with cancer. Follow up in patients with significant comorbidities as clinically warranted. For lung cancer screening, adhere to Lung-RADS guidelines. Reference: Radiology. 2017; 284(1):228-43. 2. Prominent right axillary nodes measuring up to 12 mm. There is also an 11 mm left axillary node. These are nonspecific. Recommend attention at follow-up. 3. Coronary artery calcifications. Electronically Signed   By: Narda Rutherford M.D.   On: 05/24/2023 22:34   ECHOCARDIOGRAM COMPLETE  Result Date: 05/24/2023    ECHOCARDIOGRAM REPORT   Patient Name:   Ray Bradley Date of Exam: 05/24/2023 Medical Rec #:  [id number]       Height:       70.0 in Accession #:    [id number]      Weight:       263.4 lb Date of Birth:  04-04-1971      BSA:          2.347 m Patient Age:    51 years        BP:           97/86 mmHg Patient Gender: M               HR:           111 bpm. Exam Location:  Inpatient Procedure: 2D Echo, Cardiac Doppler, Color Doppler and  Intracardiac            Opacification Agent Indications:    A fib  History:        Patient has no prior history of Echocardiogram examinations.                 Arrythmias:Atrial Fibrillation, Signs/Symptoms:septic shock;                 Risk Factors:Hypertension and Dyslipidemia.  Sonographer:    Melissa Morford RDCS (AE, PE) Referring Phys: [id number] SUBRINA SUNDIL IMPRESSIONS  1. Left ventricular ejection fraction, by estimation, is 70 to 75%. The left ventricle has hyperdynamic function. The left ventricle has no regional wall motion abnormalities. Left ventricular diastolic parameters are consistent with Grade I diastolic dysfunction (impaired relaxation).  2. Right ventricular systolic function is normal. The right ventricular size is normal.  3. The mitral valve is normal in structure. No evidence of mitral valve regurgitation. No evidence of mitral stenosis.  4. The aortic valve is normal in structure. Aortic valve regurgitation is not visualized. No aortic stenosis is present.  5. The inferior vena cava The infferior vena cava is collapsed, consistent with low left atrial pressure. Conclusion(s)/Recommendation(s): The study suggests relative hypovolemia. Rhythm during the study was sinus tachycardia. FINDINGS  Left Ventricle: Left ventricular ejection fraction, by estimation, is 70 to 75%. The left ventricle has hyperdynamic function. The left ventricle has no regional wall motion abnormalities. The left ventricular internal cavity size was normal in size. There is no left ventricular hypertrophy. Left ventricular diastolic parameters are consistent with Grade I diastolic dysfunction (impaired relaxation). Right Ventricle: The right ventricular size is normal. No increase in right ventricular wall thickness. Right ventricular systolic function is normal. Left Atrium: Left atrial size was normal in size. Right Atrium: Right atrial size was normal in size. Pericardium: There is no evidence of pericardial  effusion. Mitral Valve: The mitral valve is normal in structure. No evidence of mitral valve regurgitation. No evidence of mitral valve stenosis. Tricuspid Valve: The tricuspid valve is normal in structure. Tricuspid valve regurgitation is not demonstrated. No evidence of tricuspid stenosis. Aortic Valve: The aortic valve is normal in structure. Aortic valve regurgitation is not visualized. No aortic stenosis is present. Pulmonic Valve: The pulmonic valve was normal in structure. Pulmonic valve regurgitation is not visualized. No evidence of pulmonic stenosis. Aorta: The aortic root is normal in size and structure. Venous: The inferior vena cava The infferior vena cava is collapsed, consistent with low left atrial pressure. IAS/Shunts: No atrial level shunt detected by color flow Doppler.  LEFT VENTRICLE PLAX 2D LVIDd:         4.70 cm   Diastology LVIDs:         3.60 cm   LV e' medial:    8.38 cm/s LV PW:         1.10 cm   LV E/e' medial:  6.5 LV IVS:        1.00 cm   LV e' lateral:   11.40 cm/s LVOT diam:     2.20 cm   LV E/e' lateral: 4.8 LV SV:         85 LV SV Index:   36 LVOT Area:     3.80 cm  RIGHT VENTRICLE RV S prime:     11.30 cm/s TAPSE (M-mode): 1.7 cm LEFT ATRIUM             Index        RIGHT ATRIUM           Index LA diam:        4.00 cm 1.70 cm/m   RA Area:     15.00 cm LA Vol (A2C):   67.1 ml 28.59 ml/m  RA Volume:   38.10 ml  16.24 ml/m LA Vol (A4C):   61.4 ml 26.16 ml/m LA Biplane Vol: 64.3 ml 27.40 ml/m  AORTIC VALVE LVOT Vmax:   110.00 cm/s LVOT Vmean:  84.000 cm/s LVOT VTI:    0.224 m  AORTA Ao Root diam: 3.40 cm MITRAL VALVE MV Area (PHT): 6.32 cm    SHUNTS MV Decel Time: 120 msec    Systemic VTI:  0.22 m MV E velocity: 54.70 cm/s  Systemic Diam: 2.20 cm MV A velocity: 74.60 cm/s MV E/A ratio:  0.73 Mihai Croitoru MD Electronically signed by Rachelle Hora  Croitoru MD Signature Date/Time: 05/24/2023/10:30:49 AM    Final    CT L-SPINE NO CHARGE  Result Date: 05/24/2023 CLINICAL DATA:  Insert  Osteomyelitis, lumbar Lumbar 4 to lumbar 5 vertebra septic arthritis EXAM: CT LUMBAR SPINE WITHOUT CONTRAST TECHNIQUE: Multidetector CT imaging of the lumbar spine was performed without intravenous contrast administration. Multiplanar CT image reconstructions were also generated. RADIATION DOSE REDUCTION: This exam was performed according to the departmental dose-optimization program which includes automated exposure control, adjustment of the mA and/or kV according to patient size and/or use of iterative reconstruction technique. COMPARISON:  CT abdomen and pelvis today and 10/21/2022. FINDINGS: Segmentation: 5 lumbar type vertebrae. Alignment: Normal Vertebrae: No acute fracture or focal pathologic process. Paraspinal and other soft tissues: Normal Disc levels: Disc spaces maintained. Mild right facet arthropathy with erosions on the right at L4-5 as seen on earlier study. This is stable when compared to prior CT from February of this year. This is most compatible with facet arthritis. No evidence of infection. IMPRESSION: No acute bony abnormality. No evidence of discitis or osteomyelitis by CT. Degenerative right facet arthropathy at L4-5 unchanged since prior study. Electronically Signed   By: Charlett Nose M.D.   On: 05/24/2023 00:44   CT ABDOMEN PELVIS WO CONTRAST  Result Date: 05/23/2023 CLINICAL DATA:  Sepsis.  Lower back pain. EXAM: CT ABDOMEN AND PELVIS WITHOUT CONTRAST TECHNIQUE: Multidetector CT imaging of the abdomen and pelvis was performed following the standard protocol without IV contrast. RADIATION DOSE REDUCTION: This exam was performed according to the departmental dose-optimization program which includes automated exposure control, adjustment of the mA and/or kV according to patient size and/or use of iterative reconstruction technique. COMPARISON:  MRI abdomen 10/22/2022 and CT abdomen and pelvis 10/21/2022 FINDINGS: Lower chest: Multiple new pulmonary nodules are present in the lung bases.  For example in the left lower lobe posteriorly on series 3/image 17 measuring 7 mm. Additional nodules are present in the right lower lobe on 3/10 and 3/7 and right middle lobe on 3/5. There is mild ground-glass opacity about each nodule. Hepatobiliary: Cholecystectomy. Unremarkable noncontrast appearance of the liver. No biliary dilation. Pancreas: Unremarkable. Spleen: Unremarkable. Adrenals/Urinary Tract: Stable adrenal glands. No urinary calculi or hydronephrosis. Nondistended thick-walled bladder. Stomach/Bowel: Normal caliber large and small bowel. No bowel wall thickening. The appendix is normal.Stomach is within normal limits. Vascular/Lymphatic: Aortic atherosclerosis. No enlarged abdominal or pelvic lymph nodes. Reproductive: Unremarkable. Other: No free intraperitoneal fluid or air. Musculoskeletal: No acute fracture. Right L4-L5 facet arthritis with mild erosive change. IMPRESSION: 1. Right L4-L5 facet joint arthritis with mild erosive change. Correlation with site of pain is recommended. If there is concern for septic arthritis, MRI is recommended. 2. Multiple pulmonary nodules are present bilaterally measuring up to 7 mm with surrounding ground-glass opacity. This is nonspecific and can be seen in a variety of infectious, neoplastic, and inflammatory conditions including septic emboli. Consider dedicated CT chest for further evaluation. 3. Nondistended thick-walled bladder. Correlate with urinalysis. Aortic Atherosclerosis (ICD10-I70.0). Electronically Signed   By: Minerva Fester M.D.   On: 05/23/2023 23:20   DG Chest Port 1 View  Result Date: 05/23/2023 CLINICAL DATA:  Questionable sepsis - evaluate for abnormality EXAM: PORTABLE CHEST - 1 VIEW COMPARISON:  11/19/2013 FINDINGS: Low lung volumes. Mild perihilar and bibasilar interstitial prominence. No confluent airspace disease. Heart size and mediastinal contours are within normal limits. No effusion. Visualized bones unremarkable. IMPRESSION:  Low lung volumes with mild perihilar and bibasilar interstitial prominence. Electronically Signed  By: Corlis Leak M.D.   On: 05/23/2023 21:32   Medications:  sodium chloride 10 mL/hr at 05/25/23 1100   anticoagulant sodium citrate     ceFEPime (MAXIPIME) IV     metronidazole     norepinephrine (LEVOPHED) Adult infusion 2 mcg/min (05/25/23 1100)    [START ON 05/26/2023] aspirin EC  81 mg Oral Daily   Chlorhexidine Gluconate Cloth  6 each Topical Q0600   ferric citrate  420 mg Oral TID WC   heparin  5,000 Units Subcutaneous Q8H   insulin aspart  1-3 Units Subcutaneous Q4H   sodium bicarbonate  650 mg Oral TID    Dialysis Orders:  Assessment/Plan: ESRD: Volume status is good, w/ no signs of overload. Minimal UF pull at bedside HD today in ICU. No pt complaints with dialysis. Continue to monitor volume and electrolyte status. He is currently on IV fluids and experiencing frequent watery diarrhea; UF goal likely to remain low until his acute illness subsides. Renally dose all medications & antibiotics due to impaired renal clearance. Maintain I&O monitoring. Continue renal diet as tolerated and avoid venipuncture or BP measurement on access arm. Consider obtaining phopshorus level and changing Auryxia to parenteral iron replacement until GI symptoms resolve.  Sepsis Secondary to Serratia Bacteremia: Blood culures yielded S. marcessans. Patient receiving cefepime & metronidazole. Last LA was 4.2. Nasal swab was negative for MRSA and Vanc stopped. Was leukopenic now in leukocytosis. Managed by critical care team.   Santiago Bumpers, PA-S2 05/25/2023, 12:48 PM  [facility]

## 2023-05-25 NOTE — Progress Notes (Signed)
   Notified that pt has become progressively lethargic this afternoon.  Off peripheral NE since this am s/p iHD.  Poor PO intake today.  C/o earlier of sharp chest pain with movement and left shoulder pain (previous injury) felt to be musculoskeletal given nature and negative cardiac workup.  Given oxy 5mg  earlier this morning.  Pt also having continued type 7 stools now with FMS as rectal pouch not working.      Blood pressure 131/71, pulse (!) 119, temperature 97.8 F (36.6 C), resp. rate (!) 23, height 5\' 10"  (1.778 m), weight (S) 99.4 kg, SpO2 97%. - still awakens to verbal but drifts back off, non focal/ MAE.  Squints on pupillary exam - no increasing O2 needs, no respiratory distress - CBG 81  P:  - check BMET, mag and VBG now - cont imodium prn - remains normotensive - SSI prn held for now, continue CBG q4 - NPO till more awake- sustained - consider narcan if worsening encephalopathy.  Could be he is just exhausted from not sleeping, diarrhea, and continued events today including iHD vs worsening septic encephalopathy.   - continue to monitor closely.  Additional CCT 15 mins    Posey Boyer, MSN, AG-ACNP-BC [facility] 05/25/2023, 3:13 PM  See Amion for pager If no response to pager , please call [phone] until 7pm After 7:00 pm call Elink  [phone]

## 2023-05-25 NOTE — Progress Notes (Signed)
   05/25/23 1405  Vitals  Temp 97.8 F (36.6 C)  Pulse Rate (!) 123  Resp (!) 23  BP (!) 158/64  SpO2 96 %  O2 Device Nasal Cannula  Weight (S)  99.4 kg (Weight Bed)  Oxygen Therapy  O2 Flow Rate (L/min) 2 L/min  Patient Activity (if Appropriate) In bed  Pulse Oximetry Type Continuous  Post Treatment  Dialyzer Clearance Clear  Hemodialysis Intake (mL) 0 mL  Liters Processed 73.8  Fluid Removed (mL) 73.8 mL  Tolerated HD Treatment Yes  Post-Hemodialysis Comments Tx competed without difficulties. RN at bedside given pt. medication. Report given to RN [employer] . VSS and pt voice no complaints as well as family  AVG/AVF Arterial Site Held (minutes) 5 minutes  AVG/AVF Venous Site Held (minutes) 5 minutes   Received patient in bed to unit.  Alert and oriented.  Informed consent signed and in chart.   TX duration:3.5  Patient tolerated well.  Transported back to the room  Alert, without acute distress.  Hand-off given to patient's nurse.   Access used: Yes Access issues: No  Total UF removed: 0 Medication(s) given: See MAR Post HD VS: See Above Grid Post HD weight: 99.4 kg   [facility]

## 2023-05-25 NOTE — Plan of Care (Signed)
  Problem: Education: Goal: Knowledge of General Education information will improve Description Including pain rating scale, medication(s)/side effects and non-pharmacologic comfort measures Outcome: Progressing   Problem: Health Behavior/Discharge Planning: Goal: Ability to manage health-related needs will improve Outcome: Progressing   

## 2023-05-25 NOTE — Progress Notes (Addendum)
NAME:  Ray Bradley, MRN:  [id number], DOB:  [birth year], LOS: 1 ADMISSION DATE:  05/23/2023, CONSULTATION DATE:  05/24/23 REFERRING MD:  Janalyn Shy CHIEF COMPLAINT:  Chest Pain, chills   History of Present Illness:  Ray Bradley is a 52 y.o. male who has a PMH as below including ESRD on HD at home four times per week. He presented to [facility] ED 9/2 with chest pain after having difficulties with dialysate on machine.  Was able to get it working but then developed chills so he presented to ER.  N/V with 1 episode of diarrhea, febrile and hypotensive with elevated lactate but improved after fluid bolus.  Denied any recent sick contact/ exposures.  CT abd/pelv that showed right L4-L5 facet joint arthritis, multiple pulmonary nodules with surrounding GGO, thick walled GB. CT L spine was negative for acute processes.  PCCM initially assessed but given improvement was admitted to [facility].  PCCM later called again due to progressive hypotension with ongoing bouts of watery diarrhea.  No further N/V, tolerating PO's, denied abd pain.   Pertinent  Medical History:  has HTN (hypertension); Diabetes (HCC); Chest pain; CKD (chronic kidney disease) stage 5, GFR less than 15 ml/min (HCC); ESRD (end stage renal disease) (HCC); Allergy, unspecified, initial encounter; Anaphylactic shock, unspecified, initial encounter; Anemia in chronic kidney disease; Coagulation defect, unspecified (HCC); Complication of vascular dialysis catheter; Diarrhea, unspecified; Type 2 diabetes mellitus with diabetic neuropathy, unspecified (HCC); Dependence on renal dialysis (HCC); Hypercalcemia; Hyperkalemia; Iron deficiency anemia, unspecified; Long term (current) use of anticoagulants; Mild protein-calorie malnutrition (HCC); Mixed dyslipidemia; Class 2 obesity in adult; Other abnormal findings in urine; Other disorders of phosphorus metabolism; Fluid overload, unspecified; Hyperlipidemia; Other mechanical complication of surgically created  arteriovenous fistula, subsequent encounter; Other specified diseases of liver; Pain, unspecified; Encounter for immunization; Pruritus, unspecified; 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); Secondary hyperparathyroidism of renal origin (HCC); Shortness of breath; Type 2 diabetes mellitus with unspecified diabetic retinopathy without macular edema (HCC); Underimmunization status; Dysthymia; Hyperglycemia due to type 2 diabetes mellitus (HCC); Hypertensive retinopathy; Personal history of colonic polyps; Symptomatic cholelithiasis; End-stage renal disease on hemodialysis (HCC); Essential hypertension; Dyslipidemia; Type 2 diabetes mellitus with chronic kidney disease, without long-term current use of insulin (HCC); Elevated LFTs; History of laparoscopic cholecystectomy; Septic shock (HCC); Leukopenia; Paroxysmal atrial fibrillation (HCC); and Sepsis (HCC) on their problem list.  Significant Hospital Events: Including procedures, antibiotic start and stop dates in addition to other pertinent events   9/3 admit  Interim History / Subjective:  ongoing unmeasured numerous bouts of watery bilious/ brown diarrhea overnight, rectal pouch placed with emptied at shift change.  Placed on peripheral NE down to 5 and on 2-3L O2 overnight.  Denies abd pain or nausea.  Slept poorly.  C/o thirsty, chronic back pain from previous injury, no abd pain or tenderness.  Wife at bedside.   Plans for iHD this am Blood culture positive for serratia, Cdiff neg, GI panel pending  Objective:  Blood pressure 128/70, pulse (!) 118, temperature 98.5 F (36.9 C), temperature source Oral, resp. rate 15, height 5\' 10"  (1.778 m), weight 95.2 kg, SpO2 95%.        Intake/Output Summary (Last 24 hours) at 05/25/2023 0810 Last data filed at 05/25/2023 0800 Gross per 24 hour  Intake 1768.22 ml  Output 201 ml  Net 1567.22 ml   Filed Weights   05/23/23 2022 05/25/23 0432  Weight: 119.5 kg 95.2 kg    Examination: General:  Adult male lying in  bed in NAD, appears exhausted HEENT: MM pale, more dry today, pupils 3/r, anicteric, no JVD Neuro: Awakens to verbal, oriented x 4, MAE CV: rr, ST 110s, LUE AVF +b/t PULM:  non labored, clear anteriorly, shallow in bases, 3L at 99% GI: soft, bs+, NT/ ND, voids- but not here yet, rectal pouch with liquid bilious appearing stool Extremities: warm/dry, no LE edema  Skin: no rashes  Bedside POCUS IVC at 1.3 cm w/ respiratory c/w hypovolemia, alines noted bilaterally, no Blines   200 ml of bilious appearing stool out of rectal pouch, numerous unmeasured occurrences   Labs> K 4, bicarb 17, bun/ sCr 87/ 14.29, AG 23, Mag 1.5, AST/ ALT 104/ 160, t. Bili 2.6> 3.4, WBC 3.4> 24, plts 171> 126  Labs/imaging personally reviewed:  CT abd/pelv > 9/3 right L4-L5 facet joint arthritis, multiple pulmonary nodules with surrounding GGO, thick walled GB.  CT L spine 9/3 > negative for acute processes. CT chest 9/3> bilateral pulmonary nodules, largest LLL 7x40mm w/ adjacent GGO, prominent right and left axillary nodes, CAD  Assessment & Plan:   Serratia bacteremia Septic shock  Possible PNA aspiration vs pneumonitis w/ N/V Diarrhea Mild transaminitis with worsening hyperbilirubinemia  - suspected due to GI source - denies URI symptoms, or abd pain - echo 8/3 w/ hyperdynamic function, EF 70-75%, no RWA, normal RV/ valves, collapsed IVC c/w hypovolemia; no evidence of vegetation P:  - cont to wean peripheral NE for MAP goal > 65 - clinically still hypovolemic, given gentle LR fluid bolus for now.  Plans to keep UF even w/ bedside iHD.  After RUQ [place], will PO challenge with Pedialyte  - imodium prn  - follow stool studies> GI panel/ ova +parasite.  Neg for Cdiff - change zosyn to cefepime and flagyl for better serratia coverage - NPO for now pending [place] RUQ given rising t. Bili to rule out obstruction.  Previous cholecystectomy 10/2022 - trend WBC/ fever  curve - trend CBC/ CMET - follow cultures    Possible aspiration pneumonitis/ PNA Hypoxia  Bilateral pulmonary nodules concerning for infectious vs inflammatory changes - new O2 requirement overnight, ? Atelectasis vs developing PNA given N/V on admit; denies prior URI symptoms.   - goal sat > 92%.  No Blines noted on bedside POCUS - abx as above - if urine obtained, send for urine strep - pulmonary hygiene, IS, mobilize as able - will need f/u imaging to ensure resolution of nodules in 4-6 weeks   Neutropenia now with leukocytosis felt related to sepsis  Thrombocytopenia- suspected related to sepsis  - trend CBC/ monitor  - continue supportive care as above, abx as above - consider hemolysis workup if worsening anemia and negative workup as above for hyperbilirubinuria    ESRD on home HD AGMA - on home HD for 4 years.  Dry weight 116kg.  Last full treatment at home on 8/31 - dry weight 116kg,  Access via LUE AVF> site wnl.  Self cath's per request.  Makes some urine at times.  P:  - iHD today at bedside per Nephrology, appreciate input - Nephrology following, plans for iHD today - completing oral bicarb x 3 doses here to help with GI losses - send UA if able - trend renal indices  - strict I/Os, daily wts - renal dose meds, hemodynamic support as above  Hypomagnesemia - replete as needed  Chest pain - resolved.  - Initial EKG A.flutter but currently sinus tach on exam. Trop negative.  Hx of  PAF? But not on any AC - no RWA on TTE - continue to monitor telemetry/ supportive care   HTN - normal SBP 120-150 - cont to hold amlodipine, coreg, and losartan with septic shock   DMT2 - CBG q 4 w/ SSI prn  - A1c in am, previous A1c 7 in February  - hold ozempic   Chronic microcytic anemia of chronic disease - Hgb stable, no evidence of bleeding - trend CBC  Best Practice (right click and "Reselect all SmartList Selections" daily)   Diet/type: NPO pending RUQ then  renal diet DVT prophylaxis: prophylactic heparin  GI prophylaxis: N/A Lines: N/A Foley:  N/A Code Status:  full code Last date of multidisciplinary goals of care discussion [9/3]  Wife updated at bedside.   Labs   CBC: Recent Labs  Lab 05/23/23 2044 05/23/23 2155 05/24/23 0218 05/25/23 0231  WBC 0.6* 1.6* 3.4* 24.1*  NEUTROABS  --  1.3*  --   --   HGB 10.8* 9.6* 9.4* 9.4*  HCT 33.2* 29.4* 29.0* 27.8*  MCV 84.1 84.2 85.5 82.2  PLT 212 182 171 126*    Basic Metabolic Panel: Recent Labs  Lab 05/23/23 2044 05/24/23 0218 05/24/23 1145 05/25/23 0231  NA 139 136 135 134*  K 4.8 3.4* 3.9 4.0  CL 95* 97* 96* 94*  CO2 24 20* 19* 17*  GLUCOSE 142* 226* 146* 102*  BUN 54* 58* 71* 87*  CREATININE 10.40* 11.74* 12.66* 14.29*  CALCIUM 9.4 8.4* 8.0* 7.6*  MG  --   --   --  1.5*   GFR: Estimated Creatinine Clearance: 7.2 mL/min (A) (by C-G formula based on SCr of 14.29 mg/dL (H)). Recent Labs  Lab 05/23/23 2044 05/23/23 2155 05/24/23 0027 05/24/23 0218 05/24/23 0219 05/24/23 0411 05/24/23 1140 05/25/23 0231  WBC 0.6* 1.6*  --  3.4*  --   --   --  24.1*  LATICACIDVEN  --   --  6.6*  --  7.6* 5.8* 4.2*  --     Liver Function Tests: Recent Labs  Lab 05/24/23 0218 05/24/23 1145 05/25/23 0231  AST 158* 121* 104*  ALT 147* 152* 160*  ALKPHOS 124 121 111  BILITOT 1.8* 2.6* 3.4*  PROT 6.2* 6.5 6.5  ALBUMIN 2.9* 2.9* 2.9*   No results for input(s): "LIPASE", "AMYLASE" in the last 168 hours. No results for input(s): "AMMONIA" in the last 168 hours.  ABG    Component Value Date/Time   HCO3 27.8 (H) 07/03/2007 0036   TCO2 28 10/25/2022 0610     Coagulation Profile: Recent Labs  Lab 05/23/23 2052  INR 1.2    Cardiac Enzymes: No results for input(s): "CKTOTAL", "CKMB", "CKMBINDEX", "TROPONINI" in the last 168 hours.  HbA1C: Hgb A1c MFr Bld  Date/Time Value Ref Range Status  10/22/2022 08:10 AM 7.0 (H) 4.8 - 5.6 % Final    Comment:    (NOTE) Pre  diabetes:          5.7%-6.4%  Diabetes:              >6.4%  Glycemic control for   <7.0% adults with diabetes     CBG: Recent Labs  Lab 05/24/23 2018 05/24/23 2147 05/24/23 2336 05/25/23 0357 05/25/23 0747  GLUCAP 107* 122* 102* 95 93   Allergies No Known Allergies   Home Medications  Prior to Admission medications   Medication Sig Start Date End Date Taking? Authorizing Provider  amitriptyline (ELAVIL) 25 MG tablet Take by mouth. 02/16/23  [provider]  amLODipine (NORVASC) 10 MG tablet Take 10 mg by mouth daily.    [provider]  aspirin EC 81 MG tablet Take 81 mg by mouth daily.     [provider]  calcitRIOL (ROCALTROL) 0.5 MCG capsule Take 0.5 mcg by mouth 2 (two) times a week.    [provider]  carvedilol (COREG) 12.5 MG tablet Take 12.5-25 mg by mouth See admin instructions. 25 mg twice daily on non treatment days. 12.5-25 mg in the evening after treatment as needed for SBP > 120.    [provider]  ferric citrate (AURYXIA) 1 GM 210 MG(Fe) tablet Take 420 mg by mouth 3 (three) times daily with meals.    [provider]  heparin sodium, porcine, 1000 UNIT/ML injection Inject 2,000 Units into the vein See admin instructions. 2000 units pre treatment (2 days on, 1 day off) 02/04/20   [provider]  HYDROcodone-acetaminophen (NORCO) 5-325 MG tablet 1-2 tabs po q6 hours prn pain 12/30/22   Betha Loa, MD  losartan (COZAAR) 50 MG tablet Take 50 mg by mouth daily. 10/02/19   [provider]  Methoxy PEG-Epoetin Beta (MIRCERA IJ) Inject 1 Dose into the skin See admin instructions. Inject dose after every treatment (2 days on, 1 day off) 07/27/21   [provider]  rosuvastatin (CRESTOR) 5 MG tablet Take 5 mg by mouth daily.  08/20/18   [provider]  Semaglutide (OZEMPIC, 0.25 OR 0.5 MG/DOSE, [place]) Inject 0.5 mg into the skin every Friday.    [provider]     Critical  care time:  35 mins    Posey Boyer, MSN, AG-ACNP-BC [facility] 05/25/2023, 8:10 AM  See Amion for pager If no response to pager , please call [phone] until 7pm After 7:00 pm call Elink  [phone]

## 2023-05-25 NOTE — Plan of Care (Signed)
  Problem: Education: Goal: Knowledge of General Education information will improve Description Including pain rating scale, medication(s)/side effects and non-pharmacologic comfort measures Outcome: Progressing   

## 2023-05-26 DIAGNOSIS — A4153 Sepsis due to Serratia: Secondary | ICD-10-CM | POA: Diagnosis not present

## 2023-05-26 LAB — COMPREHENSIVE METABOLIC PANEL
ALT: 98 U/L — ABNORMAL HIGH (ref 0–44)
AST: 53 U/L — ABNORMAL HIGH (ref 15–41)
Albumin: 2.4 g/dL — ABNORMAL LOW (ref 3.5–5.0)
Alkaline Phosphatase: 129 U/L — ABNORMAL HIGH (ref 38–126)
Anion gap: 19 — ABNORMAL HIGH (ref 5–15)
BUN: 65 mg/dL — ABNORMAL HIGH (ref 6–20)
CO2: 21 mmol/L — ABNORMAL LOW (ref 22–32)
Calcium: 8.6 mg/dL — ABNORMAL LOW (ref 8.9–10.3)
Chloride: 95 mmol/L — ABNORMAL LOW (ref 98–111)
Creatinine, Ser: 10.1 mg/dL — ABNORMAL HIGH (ref 0.61–1.24)
GFR, Estimated: 6 mL/min — ABNORMAL LOW (ref >60)
Glucose, Bld: 87 mg/dL (ref 70–99)
Potassium: 3.5 mmol/L (ref 3.5–5.1)
Sodium: 135 mmol/L (ref 135–145)
Total Bilirubin: 4.1 mg/dL — ABNORMAL HIGH (ref 0.3–1.2)
Total Protein: 6 g/dL — ABNORMAL LOW (ref 6.5–8.1)

## 2023-05-26 LAB — OVA + PARASITE EXAM

## 2023-05-26 LAB — O&P RESULT

## 2023-05-26 LAB — MAGNESIUM
Magnesium: 2.5 mg/dL — ABNORMAL HIGH (ref 1.7–2.4)
Magnesium: 2.6 mg/dL — ABNORMAL HIGH (ref 1.7–2.4)

## 2023-05-26 LAB — PHOSPHORUS: Phosphorus: 5.3 mg/dL — ABNORMAL HIGH (ref 2.5–4.6)

## 2023-05-26 LAB — CBC
HCT: 26.7 % — ABNORMAL LOW (ref 39.0–52.0)
Hemoglobin: 9.1 g/dL — ABNORMAL LOW (ref 13.0–17.0)
MCH: 28.2 pg (ref 26.0–34.0)
MCHC: 34.1 g/dL (ref 30.0–36.0)
MCV: 82.7 fL (ref 80.0–100.0)
Platelets: 100 10*3/uL — ABNORMAL LOW (ref 150–400)
RBC: 3.23 MIL/uL — ABNORMAL LOW (ref 4.22–5.81)
RDW: 15.1 % (ref 11.5–15.5)
WBC: 26.4 10*3/uL — ABNORMAL HIGH (ref 4.0–10.5)
nRBC: 0 % (ref 0.0–0.2)

## 2023-05-26 LAB — GLUCOSE, CAPILLARY
Glucose-Capillary: 83 mg/dL (ref 70–99)
Glucose-Capillary: 78 mg/dL (ref 70–99)

## 2023-05-26 MED ORDER — LIDOCAINE-PRILOCAINE 2.5-2.5 % EX CREA
1.0000 | TOPICAL_CREAM | CUTANEOUS | Status: DC | PRN
  Filled 2023-05-26: qty 5

## 2023-05-26 MED ORDER — ANTICOAGULANT SODIUM CITRATE 4% (200MG/5ML) IV SOLN
5.0000 mL | Status: DC | PRN
  Filled 2023-05-26: qty 5

## 2023-05-26 MED ORDER — CHLORHEXIDINE GLUCONATE CLOTH 2 % EX PADS
6.0000 | MEDICATED_PAD | Freq: Every day | CUTANEOUS | Status: DC
  Administered 2023-05-27 – 2023-05-28 (×2): 6 via TOPICAL

## 2023-05-26 MED ORDER — HEPARIN SODIUM (PORCINE) 1000 UNIT/ML DIALYSIS
4000.0000 [IU] | Freq: Once | INTRAMUSCULAR | Status: DC
  Filled 2023-05-26 (×2): qty 4

## 2023-05-26 MED ORDER — ALTEPLASE 2 MG IJ SOLR
2.0000 mg | Freq: Once | INTRAMUSCULAR | Status: DC | PRN
  Filled 2023-05-26: qty 2

## 2023-05-26 MED ORDER — HEPARIN SODIUM (PORCINE) 1000 UNIT/ML DIALYSIS
2000.0000 [IU] | INTRAMUSCULAR | Status: DC | PRN
  Filled 2023-05-26 (×2): qty 2

## 2023-05-26 MED ORDER — LIDOCAINE HCL (PF) 1 % IJ SOLN
5.0000 mL | INTRAMUSCULAR | Status: DC | PRN
  Filled 2023-05-26: qty 5

## 2023-05-26 MED ORDER — NEPRO/CARBSTEADY PO LIQD: 237.0000 mL | ORAL | Status: DC | PRN

## 2023-05-26 MED ORDER — HEPARIN SODIUM (PORCINE) 1000 UNIT/ML DIALYSIS
1000.0000 [IU] | INTRAMUSCULAR | Status: DC | PRN
  Filled 2023-05-26: qty 1

## 2023-05-26 MED ORDER — PENTAFLUOROPROP-TETRAFLUOROETH EX AERO: 1.0000 | INHALATION_SPRAY | CUTANEOUS | Status: DC | PRN

## 2023-05-26 NOTE — TOC CM/SW Note (Signed)
Transition of Care [facility]) - Inpatient Brief Assessment   Patient Details  Name: Tom Frieders MRN: [id number] Date of Birth: 22-Oct-1970  Transition of Care [facility]) CM/SW Contact:    Tom-Johnson, Hershal Coria, RN Phone Number: 05/26/2023, 2:29 PM   Clinical Narrative:  Patient presented to the ED with Chest pain radiating to Rt Jaw, Chills and Fever during HD at home. Admitted for Septic Shock. Currently on Room Air, IV abx. ID following. Nephrology following for IHD.   From home with wife and three children. Self Employed, independent with care and drive self prior to admission. Does not have DME's at home.  PCP is Tally Joe, MD and uses [facility] on [address]   No TOC needs or recommendations noted at this time.  Patient not Medically ready for discharge.  CM will continue to follow as patient progresses with care towards discharge.    Transition of Care Asessment: Insurance and Status: Insurance coverage has been reviewed Patient has primary care physician: Yes Home environment has been reviewed: Yes Prior level of function:: Independent Prior/Current Home Services: No current home services Social Determinants of Health Reivew: SDOH reviewed no interventions necessary Readmission risk has been reviewed: Yes Transition of care needs: no transition of care needs at this time

## 2023-05-26 NOTE — Progress Notes (Signed)
[facility] Progress Note  Subjective: seen in ICU. Feeling much better. Got up and walked to bathroom. Rectal tube removed. Diarrhea better.   Vitals:   05/26/23 1053 05/26/23 1100 05/26/23 1141 05/26/23 1200  BP: (!) 96/50 (!) 95/58  115/64  Pulse: (!) 105 (!) 104  (!) 105  Resp: (!) 22 19  (!) 23  Temp:   98.4 F (36.9 C)   TempSrc:   Oral   SpO2: 93% 94%  93%  Weight:      Height:        Exam: General: Well developed, well nourished adult male Neck: JVD not elevated. Lungs: Clear bilaterally to auscultation  Heart: RRR with normal S1, S2.  Abdomen: Soft, non-tender, non-distended with normoactive BS Lower extremities: No edema or ischemic changes Neuro: Alert and oriented X 3 Dialysis Access: L AVF +bruit/thrill       OP HD: Home HD 4x per week  3.5h   116kg  BFR 400   Heparin 4000 (1 or 2 doses)   L AVF   Today --> creat 10 bun 65  K 3.5  CO2 21  alb 2.4  Ca 8.6  Assessment/ Plan:  # Serratia bacteremia - w/ resolving septic shock. Off pressors last 48hrs. Possible asp PNA. Fevers better. IV abx per pmd.   # N/V/diarrhea - much better  # ESRD - on home HD 4x per week. Had HD here yesterday, prior to that had home HD last Sat. Plan HD today and then Saturday. 3x per week while here.   # anemia esrd - Hb 9-10 here. No esa needs. Follow.   # mbd ckd - CCa in range. Add on phos. Getting binders as [place].   # volume - marked amount of diarrheal losses. Try to get standing wt w/ next HD. No fluid off w/ HD today.   # BP - home meds x 3 are on hold. Bp's 100-120 today. Follow.   # DM2 - on SSI     Ray Keeleigh Terris MD  [facility] 05/26/2023, 12:33 PM  Recent Labs  Lab 05/25/23 0231 05/25/23 1600 05/26/23 0614  HGB 9.4*  --  9.1*  ALBUMIN 2.9*  --  2.4*  CALCIUM 7.6* 7.8* 8.6*  CREATININE 14.29* 8.02* 10.10*  K 4.0 4.0 3.5   No results for input(s): "IRON", "TIBC", "FERRITIN" in the last 168 hours. Inpatient medications:  aspirin EC  81 mg Oral  Daily   Chlorhexidine Gluconate Cloth  6 each Topical Q0600   Chlorhexidine Gluconate Cloth  6 each Topical Q0600   ferric citrate  420 mg Oral TID WC   heparin  5,000 Units Subcutaneous Q8H    sodium chloride Stopped (05/26/23 0755)   anticoagulant sodium citrate     ceFEPime (MAXIPIME) IV Stopped (05/25/23 1832)   metronidazole Stopped (05/26/23 0559)   acetaminophen **OR** acetaminophen, alteplase, anticoagulant sodium citrate, feeding supplement (NEPRO CARB STEADY), heparin, lidocaine, lidocaine (PF), lidocaine-prilocaine, loperamide, ondansetron **OR** ondansetron (ZOFRAN) IV, mouth rinse, Pedialyte, pentafluoroprop-tetrafluoroeth, senna-docusate

## 2023-05-26 NOTE — Progress Notes (Signed)
NAME:  Ray Bradley, MRN:  [id number], DOB:  02-13-1971, LOS: 2 ADMISSION DATE:  05/23/2023, CONSULTATION DATE:  05/24/23 REFERRING MD:  Janalyn Shy CHIEF COMPLAINT:  Chest Pain, chills   History of Present Illness:  Ray Bradley is a 52 y.o. male who has a PMH as below including ESRD on HD at home four times per week. He presented to [facility] ED 9/2 with chest pain after having difficulties with dialysate on machine.  Was able to get it working but then developed chills so he presented to ER.  N/V with 1 episode of diarrhea, febrile and hypotensive with elevated lactate but improved after fluid bolus.  Denied any recent sick contact/ exposures.  CT abd/pelv that showed right L4-L5 facet joint arthritis, multiple pulmonary nodules with surrounding GGO, thick walled GB. CT L spine was negative for acute processes.  PCCM initially assessed but given improvement was admitted to [facility].  PCCM later called again due to progressive hypotension with ongoing bouts of watery diarrhea.  No further N/V, tolerating PO's, denied abd pain.   Pertinent  Medical History:  has HTN (hypertension); Diabetes (HCC); Chest pain; CKD (chronic kidney disease) stage 5, GFR less than 15 ml/min (HCC); ESRD (end stage renal disease) (HCC); Allergy, unspecified, initial encounter; Anaphylactic shock, unspecified, initial encounter; Anemia in chronic kidney disease; Coagulation defect, unspecified (HCC); Complication of vascular dialysis catheter; Diarrhea, unspecified; Type 2 diabetes mellitus with diabetic neuropathy, unspecified (HCC); Dependence on renal dialysis (HCC); Hypercalcemia; Hyperkalemia; Iron deficiency anemia, unspecified; Long term (current) use of anticoagulants; Mild protein-calorie malnutrition (HCC); Mixed dyslipidemia; Class 2 obesity in adult; Other abnormal findings in urine; Other disorders of phosphorus metabolism; Fluid overload, unspecified; Hyperlipidemia; Other mechanical complication of surgically created  arteriovenous fistula, subsequent encounter; Other specified diseases of liver; Pain, unspecified; Encounter for immunization; Pruritus, unspecified; 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); Secondary hyperparathyroidism of renal origin (HCC); Shortness of breath; Type 2 diabetes mellitus with unspecified diabetic retinopathy without macular edema (HCC); Underimmunization status; Dysthymia; Hyperglycemia due to type 2 diabetes mellitus (HCC); Hypertensive retinopathy; Personal history of colonic polyps; Symptomatic cholelithiasis; End-stage renal disease on hemodialysis (HCC); Essential hypertension; Dyslipidemia; Type 2 diabetes mellitus with chronic kidney disease, without long-term current use of insulin (HCC); Elevated LFTs; History of laparoscopic cholecystectomy; Septic shock (HCC); Leukopenia; Paroxysmal atrial fibrillation (HCC); and Sepsis (HCC) on their problem list.  Significant Hospital Events: Including procedures, antibiotic start and stop dates in addition to other pertinent events   9/3 admit 9/4 progressive hypotension> ICU, intermittent pressors, iHD, BC +serratia   Interim History / Subjective:  Improved mental status this am, rested overnight Only c/o of chronic left shoulder pain, reports gets diarrhea if he takes [place] without food afebrile  Objective:  Blood pressure (!) 96/50, pulse (!) 105, temperature 98.6 F (37 C), temperature source Oral, resp. rate (!) 22, height 5\' 10"  (1.778 m), weight 93 kg, SpO2 93%.        Intake/Output Summary (Last 24 hours) at 05/26/2023 1058 Last data filed at 05/26/2023 0800 Gross per 24 hour  Intake 674.4 ml  Output 155.8 ml  Net 518.6 ml   Filed Weights   05/25/23 0941 05/25/23 1405 05/26/23 0458  Weight: 107.4 kg (S) 99.4 kg 93 kg   Examination: General:  Adult male lying in bed in NAD, wife at bedside HEENT: MM pink/ slightly moist Neuro: Awake, oriented x 4, MAE CV: rr, ST 110, no murmur, LUE AVF +b/t, dressing  in place, no warmth PULM:  non labored,  CTA, room air GI: soft, bs+, mild upper abd tenderness, ND, has not voided yet, FMS Extremities: warm/dry, no LE edema  Skin: no rashes or lesions  Stool > 72ml/ 12hrs Afebrile BC > one set +serratia, second set +GNR pending   Labs reviewed> improving LFTs but t. Bili up, WBC 24> 26, H/H 9.1/26, plts 126> 100  Labs/imaging personally reviewed:  CT abd/pelv > 9/3 right L4-L5 facet joint arthritis, multiple pulmonary nodules with surrounding GGO, thick walled GB.  CT L spine 9/3 > negative for acute processes. CT chest 9/3> bilateral pulmonary nodules, largest LLL 7x40mm w/ adjacent GGO, prominent right and left axillary nodes, CAD  Assessment & Plan:   Serratia bacteremia Septic and likely hypovolemic shock, resolving Possible PNA aspiration vs pneumonitis w/ N/V Diarrhea, improving  Mild transaminitis, improving with ongoing hyperbilirubinemia  - no URI symptoms/ abd pain - echo 8/3 w/ hyperdynamic function, EF 70-75%, no RWA, normal RV/ valves, collapsed IVC c/w hypovolemia; no evidence of vegetation P:  - remains off pressors since 9/4 am.   - pending ID consult given blood cultures differences, appreciate input.  Continue cefepime and flagyl for now - hyperbilirubinemia felt related to sepsis.  H/H stable.  CT a/p and RUQ neg.  Consider hemolysis labs if worsening anemia  - Cdiff/ GI panel neg.  O/P pending.  Enteric precautions d/c.  Diarrhea possibly due to poor PO intake while taking auryxia - d/c FMS - encourage PO intake/ progress diet as tolerated  - Pedialyte prn  - trend WBC/ fever curve - trend CBC/ CMET - follow cultures  - transfer to PCU and TRH as of 9/5 as remains hemodynamically and clinically improved.   Possible aspiration pneumonitis/ PNA w/ bilateral pulmonary nodules concerning for infectious vs inflammatory changes Hypoxia, resolved - off O2, continue supplemental O2 prn,  sat > 92%.   - abx as above will cover  for aspiration - if urine obtained, send for urine strep - OOB today, pulm hygiene/ IS - pulmonary hygiene, IS, mobilize as able - will need f/u imaging to ensure resolution of nodules in 4-6 weeks   Thrombocytopenia- suspected related to sepsis  - trend CBC/ monitor, stable - continue supportive care as above, abx as above  ESRD on home HD AGMA, improved - on home HD for 4 years.  Dry weight 116kg.  Last full treatment at home on 8/31.  iHD 9/4 - dry weight 116kg,  Access via LUE AVF> site wnl.  Self cath's per request.  Makes some urine at times.  P:  - Nephrology following, appreciate input - Nephrology following, plans for iHD today - trend renal indices  - strict I/Os, daily wts - renal dose meds, hemodynamic support as above   Hypomagnesemia, suspected 2/2 diarrhea - tend q12 still stable, replete prn   Chest pain - resolved. Seems musculoskeletal  Left shoulder pain- chronic/ prior injury - Initial EKG A.flutter but currently sinus tach on exam. Trop negative.  ?PAF? But not on any AC but do not see this on EMR - no RWA on TTE - lidocaine patch prn   HTN - normal SBP 120-150 - cont to hold amlodipine, coreg, and losartan as BP remains 100-120 - cont ASA   DMT2 - SSI d/c given borderline glucose but poor PO intake. - A1c in am, previous A1c 7 in February  - hold ozempic    Chronic microcytic anemia of chronic disease - Hgb stable, no evidence of bleeding - trend CBC  Best  Practice (right click and "Reselect all SmartList Selections" daily)   Diet/type: renal diet with fluid restriction DVT prophylaxis: prophylactic heparin  GI prophylaxis: N/A Lines: N/A Foley:  N/A Code Status:  full code Last date of multidisciplinary goals of care discussion [9/3]  Wife updated at bedside 9/5  Labs   CBC: Recent Labs  Lab 05/23/23 2044 05/23/23 2155 05/24/23 0218 05/25/23 0231 05/26/23 0614  WBC 0.6* 1.6* 3.4* 24.1* 26.4*  NEUTROABS  --  1.3*  --   --    --   HGB 10.8* 9.6* 9.4* 9.4* 9.1*  HCT 33.2* 29.4* 29.0* 27.8* 26.7*  MCV 84.1 84.2 85.5 82.2 82.7  PLT 212 182 171 126* 100*    Basic Metabolic Panel: Recent Labs  Lab 05/24/23 0218 05/24/23 1145 05/25/23 0231 05/25/23 1600 05/25/23 2355 05/26/23 0614  NA 136 135 134* 134*  --  135  K 3.4* 3.9 4.0 4.0  --  3.5  CL 97* 96* 94* 97*  --  95*  CO2 20* 19* 17* 21*  --  21*  GLUCOSE 226* 146* 102* 79  --  87  BUN 58* 71* 87* 49*  --  65*  CREATININE 11.74* 12.66* 14.29* 8.02*  --  10.10*  CALCIUM 8.4* 8.0* 7.6* 7.8*  --  8.6*  MG  --   --  1.5* 1.5* 2.5*  --    GFR: Estimated Creatinine Clearance: 10 mL/min (A) (by C-G formula based on SCr of 10.1 mg/dL (H)). Recent Labs  Lab 05/23/23 2155 05/24/23 0027 05/24/23 0218 05/24/23 0219 05/24/23 0411 05/24/23 1140 05/25/23 0231 05/26/23 0614  WBC 1.6*  --  3.4*  --   --   --  24.1* 26.4*  LATICACIDVEN  --  6.6*  --  7.6* 5.8* 4.2*  --   --     Liver Function Tests: Recent Labs  Lab 05/24/23 0218 05/24/23 1145 05/25/23 0231 05/26/23 0614  AST 158* 121* 104* 53*  ALT 147* 152* 160* 98*  ALKPHOS 124 121 111 129*  BILITOT 1.8* 2.6* 3.4* 4.1*  PROT 6.2* 6.5 6.5 6.0*  ALBUMIN 2.9* 2.9* 2.9* 2.4*   No results for input(s): "LIPASE", "AMYLASE" in the last 168 hours. No results for input(s): "AMMONIA" in the last 168 hours.  ABG    Component Value Date/Time   HCO3 26.2 05/25/2023 1557   TCO2 28 10/25/2022 0610   O2SAT 85 05/25/2023 1557     Coagulation Profile: Recent Labs  Lab 05/23/23 2052  INR 1.2    Cardiac Enzymes: No results for input(s): "CKTOTAL", "CKMB", "CKMBINDEX", "TROPONINI" in the last 168 hours.  HbA1C: Hgb A1c MFr Bld  Date/Time Value Ref Range Status  10/22/2022 08:10 AM 7.0 (H) 4.8 - 5.6 % Final    Comment:    (NOTE) Pre diabetes:          5.7%-6.4%  Diabetes:              >6.4%  Glycemic control for   <7.0% adults with diabetes     CBG: Recent Labs  Lab 05/25/23 1536  05/25/23 1928 05/25/23 2324 05/26/23 0342 05/26/23 0746  GLUCAP 83 105* 94 83 78   Allergies No Known Allergies   Home Medications  Prior to Admission medications   Medication Sig Start Date End Date Taking? Authorizing Provider  amitriptyline (ELAVIL) 25 MG tablet Take by mouth. 02/16/23   [provider]  amLODipine (NORVASC) 10 MG tablet Take 10 mg by mouth daily.    [provider]  aspirin EC 81 MG tablet Take 81 mg by mouth daily.     [provider]  calcitRIOL (ROCALTROL) 0.5 MCG capsule Take 0.5 mcg by mouth 2 (two) times a week.    [provider]  carvedilol (COREG) 12.5 MG tablet Take 12.5-25 mg by mouth See admin instructions. 25 mg twice daily on non treatment days. 12.5-25 mg in the evening after treatment as needed for SBP > 120.    [provider]  ferric citrate (AURYXIA) 1 GM 210 MG(Fe) tablet Take 420 mg by mouth 3 (three) times daily with meals.    [provider]  heparin sodium, porcine, 1000 UNIT/ML injection Inject 2,000 Units into the vein See admin instructions. 2000 units pre treatment (2 days on, 1 day off) 02/04/20   [provider]  HYDROcodone-acetaminophen (NORCO) 5-325 MG tablet 1-2 tabs po q6 hours prn pain 12/30/22   Betha Loa, MD  losartan (COZAAR) 50 MG tablet Take 50 mg by mouth daily. 10/02/19   [provider]  Methoxy PEG-Epoetin Beta (MIRCERA IJ) Inject 1 Dose into the skin See admin instructions. Inject dose after every treatment (2 days on, 1 day off) 07/27/21   [provider]  rosuvastatin (CRESTOR) 5 MG tablet Take 5 mg by mouth daily.  08/20/18   [provider]  Semaglutide (OZEMPIC, 0.25 OR 0.5 MG/DOSE, [place]) Inject 0.5 mg into the skin every Friday.    [provider]     Critical care time:  n/a    Posey Boyer, MSN, AG-ACNP-BC [facility] 05/26/2023, 10:58 AM  See Amion for pager If no response to pager , please  call [phone] until 7pm After 7:00 pm call Elink  [phone]

## 2023-05-26 NOTE — Consult Note (Addendum)
I have seen and examined the patient. I have personally reviewed the clinical findings, laboratory findings, microbiological data and imaging studies. The assessment and treatment plan was discussed with the Nurse Practitioner. I agree with her/his recommendations except following additions/corrections  52 year old male with history of ESRD on HD with left AVF  type II DM who presented with acute onset chest pain with radiation to the jaw/ back pain including nausea/vomiting, fever with some difficulties with dialysis machine. Diarrhea appears to have started day of presentation. ID consulted for serratia bacteremia with unclear source.   Exam -adult male lying in the bed and talking on the phone.  HEENT WNL.  Heart and lung sounds within normal limit.  Abdomen soft and nontender.  Extremities with no pedal edema.  Left upper extremity aVF with no redness, swelling or tenderness  Labs/Imaging/microbiologic data reviewed. Possible Liver cirrhosis in [place] ( HBV, HCV, HIV negative in 10/2022)  Continue cefepime and metronidazole. Fu ID of GPR- likely an anaerobe. Source is unknown. Unlikely GI  or Left AVF. TTE unremarkable as well as uncommon organism to cause endocarditis. CT AP unremarkable.  Can do cefepime with HD for 2 weeks if he is agreeable. If not, PO bactrim/ciprofloxacin are potential options for PO therapy if susceptible  Monitor CBC and CMP on abtx  I have personally spent 81 minutes involved in face-to-face and non-face-to-face activities for this patient on the day of the visit. Professional time spent includes the following activities: Preparing to see the patient (review of tests), Obtaining and/or reviewing separately obtained history (admission/discharge record), Performing a medically appropriate examination and/or evaluation , Ordering medications/tests/procedures, referring and communicating with other health care professionals, Documenting clinical information in the EMR,  Independently interpreting results (not separately reported), Communicating results to the patient/family/caregiver, Counseling and educating the patient/family/caregiver and Care coordination (not separately reported).    [facility]    Date of Admission:  05/23/2023     Total days of antibiotics 2   Cefepime   Metronidazole               Reason for Consult: serratia bacteremia    Referring Provider: Agarwala Primary Care Provider: Tally Joe, MD    Assessment: Ray Bradley is a 52 y.o. male with abrupt onset chills/vomiting/diarrhea, found to have serratia marcescens bacteremia.     Serratia Bacteremia -  -continue cefepime  -A&P CT w/o source. TTE unremarkable. Clinically does not have vertebral infection concern.  -I wonder if he had transient bacteremia from skin / HD cannulation. I would find it odd that this would have come from OP HD center 2 weeks ago without durable nidus of infection.  -he is not interested in HD center to get antibiotics after HD - will consider oral therapy once susceptibilities are back. Plan for 2 weeks    GPR Bacteremia -  Both sets growing  -follow for ID  -continue the metronidazole   Diarrhea, N/V -  Onset after presenting to hospital  -GIP negative, CDiff negative.  -FU O&P exam (doubt this is contributing) -Presume related to transient sepsis etiology  -resolved     ESRD on iHD via LUA AVF - -no trouble with fistula and has been cannulating well at home for him    Abnormal CT of L4-L5 -  -described on A/P CT but dedicated CT of Lspine w/o any concern for discitis / OM   Pulmonary Nodules -  -non-specific bilateral findings  -would check TTE for  completeness inconsideration for IE (done and no abnormalities); no HD cath and serratia would be atypical for native valve IE -FU outpatient recommend    Plan: Continue cefepime  Continue metronidazole  Follow susceptibilities for serratia to plan  oral step down Follow GPR ID on blood   Principal Problem:   Septic shock (HCC) Active Problems:   ESRD (end stage renal disease) (HCC)   Hyperlipidemia   Essential hypertension   Leukopenia   Paroxysmal atrial fibrillation (HCC)   Sepsis (HCC)    aspirin EC  81 mg Oral Daily   Chlorhexidine Gluconate Cloth  6 each Topical Q0600   Chlorhexidine Gluconate Cloth  6 each Topical Q0600   ferric citrate  420 mg Oral TID WC   heparin  5,000 Units Subcutaneous Q8H    HPI: Ray Bradley is a 52 y.o. male admitted from home with fevers, chills chest/back pains on 05/23/2023.   PMHx:  ESRD on iHD via LUE AVF, HTN, T2DM, HLD.  Recent History:  Presented with an acute history of back pain that started during home HD treatment session.  Also noticed chills/fevers. He also has described episodes of diarrhea. No other symptoms have been described.   In ER he was borderline hypotensive - blood cultures drawn and started on empiric vancomycin + cefepime + metronidazole. Respiratory panel negative. WBC initially presented at 0.6 >> 1.6 >> 3.4 (now acutely up to 26.4), lactate 6.6.  CT scan of A/P - Rt L4-L5 facet joint arthritis with mild erosive changes - if concern for septic arthritis MRI recommended. Multiple pulmonary nodules present bilaterally with surrounding ground glass opacities.   Dedicated CT spine - No acute bony abnormality. No evidence of discitis or osteomyelitis by CT. Degenerative right facet arthropathy at L4-5 unchanged since prior study.  Transaminitis noted >> subtle nodular liver noted on U/S.   Was in totally normal state of health up until Monday - describes sudden onset chills with diarrhea/N/V after he came to the hospital.  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  Does home HD with self cannulation and aseptic technique. [place] and worries that he  got bacteria from them as they did not use gloves.    Review of Systems: Review of Systems  Constitutional:  Positive for chills and fever. Negative for malaise/fatigue and weight loss.  HENT:  Negative for sore throat.        No dental problems  Respiratory:  Negative for cough and sputum production.   Cardiovascular:  Negative for chest pain and leg swelling.  Gastrointestinal:  Positive for diarrhea and vomiting. Negative for abdominal pain.  Genitourinary:  Negative for dysuria and flank pain.  Musculoskeletal:  Negative for joint pain, myalgias and neck pain.  Skin:  Negative for rash.  Neurological:  Negative for dizziness, tingling and headaches.  Psychiatric/Behavioral:  Negative for depression and substance abuse. The patient is not nervous/anxious and does not have insomnia.      Past Medical History:  Diagnosis Date   Anemia    Asthma    as a child   Chest pain    Chronic kidney disease    dialysis - at home hemodialysis -does 2 days in a row with one day off   COVID    2022 - mild case   History of kidney stones    HTN (hypertension)    Hyperlipidemia  Pneumonia    as a child   Type II or unspecified type diabetes mellitus without mention of complication, not stated as uncontrolled    Wears glasses    Past Surgical History:  Procedure Laterality Date   AV FISTULA PLACEMENT Left 08/24/2019   Procedure: CREATION OF ARTERIOVENOUS (AV) FISTULA  LEFT ARM;  Surgeon: Cephus Shelling, MD;  Location: [facility] OR;  Service: Vascular;  Laterality: Left;   CARPAL TUNNEL RELEASE Left 12/30/2022   Procedure: LEFT CARPAL TUNNEL RELEASE;  Surgeon: Betha Loa, MD;  Location: [facility] OR;  Service: Orthopedics;  Laterality: Left;  30 MIN   CHOLECYSTECTOMY N/A 10/25/2022   Procedure: LAPAROSCOPIC CHOLECYSTECTOMY;  Surgeon: Diamantina Monks, MD;  Location: [facility] OR;  Service: General;  Laterality: N/A;   CIRCUMCISION, NON-NEWBORN  2004   COLONOSCOPY W/ BIOPSIES AND POLYPECTOMY     EYE  SURGERY     FOOT SURGERY     IR FLUORO GUIDE CV LINE RIGHT  07/23/2019   IR FLUORO GUIDE CV LINE RIGHT  09/19/2019   IR US GUIDE VASC ACCESS RIGHT  07/23/2019   MULTIPLE TOOTH EXTRACTIONS      Social History   Tobacco Use   Smoking status: Never    Passive exposure: Past   Smokeless tobacco: Never  Vaping Use   Vaping status: Never Used  Substance Use Topics   Alcohol use: Not Currently   Drug use: No    Family History  Problem Relation Age of Onset   Colon cancer Father    Diabetes Other        family history   Hypertension Other        family history   Aneurysm Mother        died age 72, brain   Hypertension Mother    Hypertension Sister 15   Hypertension Sister 4   Hypertension Brother 40   No Known Allergies  OBJECTIVE: Blood pressure 117/66, pulse (!) 107, temperature 98.6 F (37 C), temperature source Oral, resp. rate 19, height 5\' 10"  (1.778 m), weight 93 kg, SpO2 94%.  Physical Exam Constitutional:      Appearance: He is well-developed. He is not ill-appearing.     Comments: He is conducting business on his cell phone intermittently throughout our visit. Mentation is perfect. Wife at bedside.   Abdominal:     General: Bowel sounds are normal.     Palpations: Abdomen is soft.     Tenderness: There is no abdominal tenderness.  Skin:    General: Skin is warm and dry.     Comments: LUE fistula normal temp and skin color w/o any ulcerations  Neurological:     Mental Status: He is alert.     Lab Results Lab Results  Component Value Date   WBC 26.4 (H) 05/26/2023   HGB 9.1 (L) 05/26/2023   HCT 26.7 (L) 05/26/2023   MCV 82.7 05/26/2023   PLT 100 (L) 05/26/2023    Lab Results  Component Value Date   CREATININE 10.10 (H) 05/26/2023   BUN 65 (H) 05/26/2023   NA 135 05/26/2023   K 3.5 05/26/2023   CL 95 (L) 05/26/2023   CO2 21 (L) 05/26/2023    Lab Results  Component Value Date   ALT 98 (H) 05/26/2023   AST 53 (H) 05/26/2023   ALKPHOS 129 (H)  05/26/2023   BILITOT 4.1 (H) 05/26/2023     Microbiology: Recent Results (from the past 240 hour(s))  Resp panel by RT-PCR (RSV,  Flu A&B, Covid) Anterior Nasal Swab     Status: None   Collection Time: 05/23/23  8:52 PM   Specimen: Anterior Nasal Swab  Result Value Ref Range Status   SARS Coronavirus 2 by RT PCR NEGATIVE NEGATIVE Final   Influenza A by PCR NEGATIVE NEGATIVE Final   Influenza B by PCR NEGATIVE NEGATIVE Final    Comment: (NOTE) The Xpert Xpress SARS-CoV-2/FLU/RSV plus assay is intended as an aid in the diagnosis of influenza from Nasopharyngeal swab specimens and should not be used as a sole basis for treatment. Nasal washings and aspirates are unacceptable for Xpert Xpress SARS-CoV-2/FLU/RSV testing.  Fact Sheet for Patients: BloggerCourse.com  Fact Sheet for Healthcare Providers: SeriousBroker.it  This test is not yet approved or cleared by the [place] FDA and has been authorized for detection and/or diagnosis of SARS-CoV-2 by FDA under an Emergency Use Authorization (EUA). This EUA will remain in effect (meaning this test can be used) for the duration of the COVID-19 declaration under Section 564(b)(1) of the Act, 21 U.S.C. section 360bbb-3(b)(1), unless the authorization is terminated or revoked.     Resp Syncytial Virus by PCR NEGATIVE NEGATIVE Final    Comment: (NOTE) Fact Sheet for Patients: BloggerCourse.com  Fact Sheet for Healthcare Providers: SeriousBroker.it  This test is not yet approved or cleared by the [place] FDA and has been authorized for detection and/or diagnosis of SARS-CoV-2 by FDA under an Emergency Use Authorization (EUA). This EUA will remain in effect (meaning this test can be used) for the duration of the COVID-19 declaration under Section 564(b)(1) of the Act, 21 U.S.C. section 360bbb-3(b)(1), unless the authorization  is terminated or revoked.  Performed at [facility], [address], [place], [place] [address]   Blood Culture (routine x 2)     Status: Abnormal (Preliminary result)   Collection Time: 05/23/23  8:52 PM   Specimen: BLOOD  Result Value Ref Range Status   Specimen Description BLOOD SITE NOT SPECIFIED  Final   Special Requests   Final    BOTTLES DRAWN AEROBIC AND ANAEROBIC Blood Culture results may not be optimal due to an inadequate volume of blood received in culture bottles   Culture  Setup Time   Final    GRAM NEGATIVE RODS GRAM POSITIVE RODS BOTTLES DRAWN AEROBIC ONLY CRITICAL RESULT CALLED TO, READ BACK BY AND VERIFIED WITH: BRYK,PHARMD@2349  05/24/23 MK    Culture (A)  Final    SERRATIA MARCESCENS SUSCEPTIBILITIES TO FOLLOW Performed at [facility], [address] [place], [place] [address]    Report Status PENDING  Incomplete  Blood Culture ID Panel (Reflexed)     Status: Abnormal   Collection Time: 05/23/23  8:52 PM  Result Value Ref Range Status   Enterococcus faecalis NOT DETECTED NOT DETECTED Final   Enterococcus Faecium NOT DETECTED NOT DETECTED Final   Listeria monocytogenes NOT DETECTED NOT DETECTED Final   Staphylococcus species NOT DETECTED NOT DETECTED Final   Staphylococcus aureus (BCID) NOT DETECTED NOT DETECTED Final   Staphylococcus epidermidis NOT DETECTED NOT DETECTED Final   Staphylococcus lugdunensis NOT DETECTED NOT DETECTED Final   Streptococcus species NOT DETECTED NOT DETECTED Final   Streptococcus agalactiae NOT DETECTED NOT DETECTED Final   Streptococcus pneumoniae NOT DETECTED NOT DETECTED Final   Streptococcus pyogenes NOT DETECTED NOT DETECTED Final   A.calcoaceticus-baumannii NOT DETECTED NOT DETECTED Final   Bacteroides fragilis NOT DETECTED NOT DETECTED Final   Enterobacterales DETECTED (A) NOT DETECTED Final  Comment: Enterobacterales represent a large order of gram negative bacteria, not a single organism. CRITICAL  RESULT CALLED TO, READ BACK BY AND VERIFIED WITH: V BRYK,PHARMD@2349  05/24/23 MK    Enterobacter cloacae complex NOT DETECTED NOT DETECTED Final   Escherichia coli NOT DETECTED NOT DETECTED Final   Klebsiella aerogenes NOT DETECTED NOT DETECTED Final   Klebsiella oxytoca NOT DETECTED NOT DETECTED Final   Klebsiella pneumoniae NOT DETECTED NOT DETECTED Final   Proteus species NOT DETECTED NOT DETECTED Final   Salmonella species NOT DETECTED NOT DETECTED Final   Serratia marcescens DETECTED (A) NOT DETECTED Final    Comment: CRITICAL RESULT CALLED TO, READ BACK BY AND VERIFIED WITH: V BRYK,PHARMD@2349  05/24/23 MK    Haemophilus influenzae NOT DETECTED NOT DETECTED Final   Neisseria meningitidis NOT DETECTED NOT DETECTED Final   Pseudomonas aeruginosa NOT DETECTED NOT DETECTED Final   Stenotrophomonas maltophilia NOT DETECTED NOT DETECTED Final   Candida albicans NOT DETECTED NOT DETECTED Final   Candida auris NOT DETECTED NOT DETECTED Final   Candida glabrata NOT DETECTED NOT DETECTED Final   Candida krusei NOT DETECTED NOT DETECTED Final   Candida parapsilosis NOT DETECTED NOT DETECTED Final   Candida tropicalis NOT DETECTED NOT DETECTED Final   Cryptococcus neoformans/gattii NOT DETECTED NOT DETECTED Final   CTX-M ESBL NOT DETECTED NOT DETECTED Final   Carbapenem resistance IMP NOT DETECTED NOT DETECTED Final   Carbapenem resistance KPC NOT DETECTED NOT DETECTED Final   Carbapenem resistance NDM NOT DETECTED NOT DETECTED Final   Carbapenem resist OXA 48 LIKE NOT DETECTED NOT DETECTED Final   Carbapenem resistance VIM NOT DETECTED NOT DETECTED Final    Comment: Performed at [facility], [address], [place], [place] [address]  Blood Culture (routine x 2)     Status: None (Preliminary result)   Collection Time: 05/23/23  8:55 PM   Specimen: BLOOD  Result Value Ref Range Status   Specimen Description BLOOD SITE NOT SPECIFIED  Final   Special Requests   Final    BOTTLES  DRAWN AEROBIC AND ANAEROBIC Blood Culture results may not be optimal due to an inadequate volume of blood received in culture bottles   Culture  Setup Time   Final    GRAM POSITIVE RODS ANAEROBIC BOTTLE ONLY CRITICAL RESULT CALLED TO, READ BACK BY AND VERIFIED WITH: PHARMD J. MILLEN [id number] @ 1719 FH  Performed at [facility], [address], [place], [place] [address]    Culture GRAM POSITIVE RODS  Final   Report Status PENDING  Incomplete  Gastrointestinal Panel by PCR , Stool     Status: None   Collection Time: 05/24/23  6:13 PM   Specimen: Stool  Result Value Ref Range Status   Campylobacter species NOT DETECTED NOT DETECTED Final   Plesimonas shigelloides NOT DETECTED NOT DETECTED Final   Salmonella species NOT DETECTED NOT DETECTED Final   Yersinia enterocolitica NOT DETECTED NOT DETECTED Final   Vibrio species NOT DETECTED NOT DETECTED Final   Vibrio cholerae NOT DETECTED NOT DETECTED Final   Enteroaggregative E coli (EAEC) NOT DETECTED NOT DETECTED Final   Enteropathogenic E coli (EPEC) NOT DETECTED NOT DETECTED Final   Enterotoxigenic E coli (ETEC) NOT DETECTED NOT DETECTED Final   Shiga like toxin producing E coli (STEC) NOT DETECTED NOT DETECTED Final   Shigella/Enteroinvasive E coli (EIEC) NOT DETECTED NOT DETECTED Final   Cryptosporidium NOT DETECTED NOT DETECTED Final   Cyclospora cayetanensis NOT DETECTED NOT DETECTED Final  Entamoeba histolytica NOT DETECTED NOT DETECTED Final   Giardia lamblia NOT DETECTED NOT DETECTED Final   Adenovirus F40/41 NOT DETECTED NOT DETECTED Final   Astrovirus NOT DETECTED NOT DETECTED Final   Norovirus GI/GII NOT DETECTED NOT DETECTED Final   Rotavirus A NOT DETECTED NOT DETECTED Final   Sapovirus (I, II, IV, and V) NOT DETECTED NOT DETECTED Final    Comment: Performed at [facility], [address], [place], [place] [address]  C Difficile Quick Screen w PCR reflex     Status: None   Collection Time: 05/24/23  6:14 PM    Specimen: STOOL  Result Value Ref Range Status   C Diff antigen NEGATIVE NEGATIVE Final   C Diff toxin NEGATIVE NEGATIVE Final   C Diff interpretation No C. difficile detected.  Final    Comment: Performed at [facility], [address], [place], [place] [address]  MRSA Next Gen by PCR, Nasal     Status: None   Collection Time: 05/24/23  8:19 PM   Specimen: Nasal Mucosa; Nasal Swab  Result Value Ref Range Status   MRSA by PCR Next Gen NOT DETECTED NOT DETECTED Final    Comment: (NOTE) The GeneXpert MRSA Assay (FDA approved for NASAL specimens only), is one component of a comprehensive MRSA colonization surveillance program. It is not intended to diagnose MRSA infection nor to guide or monitor treatment for MRSA infections. Test performance is not FDA approved in patients less than 71 years old. Performed at [facility], [address], [place], [place] [address]    Imaging US Abdomen Limited RUQ (LIVER/GB)  Result Date: 05/25/2023 CLINICAL DATA:  Transaminitis EXAM: ULTRASOUND ABDOMEN LIMITED RIGHT UPPER QUADRANT COMPARISON:  CT abdomen pelvis 10/21/2022 FINDINGS: Gallbladder: Status post cholecystectomy Common bile duct: Diameter: 4 mm Liver: Parenchymal echogenicity: Within normal limits Contours: Subtle nodularity Lesions: None Portal vein: Patent.  Hepatopetal flow Other: None. IMPRESSION: Subtle nodularity of hepatic contour suspicious for cirrhosis. No suspicious liver lesions. Electronically Signed   By: Acquanetta Belling M.D.   On: 05/25/2023 10:10   CT CHEST WO CONTRAST  Result Date: 05/24/2023 CLINICAL DATA:  Lung nodules on abdominal CT. EXAM: CT CHEST WITHOUT CONTRAST TECHNIQUE: Multidetector CT imaging of the chest was performed following the standard protocol without IV contrast. RADIATION DOSE REDUCTION: This exam was performed according to the departmental dose-optimization program which includes automated exposure control, adjustment of the mA and/or kV  according to patient size and/or use of iterative reconstruction technique. COMPARISON:  Lung bases from abdominopelvic CT yesterday no prior chest CT available. FINDINGS: Cardiovascular: The heart is upper normal in size. No pericardial effusion. There are coronary artery calcifications. Normal caliber thoracic aorta. Mediastinum/Nodes: No mediastinal adenopathy. Prominent right axillary nodes measuring up to 12 mm. There is also an 11 mm left axillary node. Limited hilar assessment in the absence of IV contrast. No esophageal wall thickening. Lungs/Pleura: Bilateral pulmonary nodules, many of which are subpleural in location. The largest nodule is in the subpleural left lower lobe measuring 7 x 7 mm, series 4, image 58. There is mild adjacent ground-glass. There is also a benign calcified granuloma in the left lower lobe. No pleural fluid. Trachea and central airways are clear. No features of pulmonary edema. Upper Abdomen: Assessed on abdominopelvic CT yesterday. No acute findings. Musculoskeletal: Mild diffuse thoracic spondylosis with spurring and scattered Schmorl's nodes. There are no acute or suspicious osseous abnormalities. IMPRESSION: 1. Bilateral pulmonary nodules, many of which are  subpleural in location. The largest nodule is in the subpleural left lower lobe measuring 7 x 7 mm. There is mild adjacent ground-glass. These are nonspecific, and may be infectious or inflammatory. Per Fleischner Society Guidelines, recommend a non-contrast Chest CT at 3-6 months, then consider another non-contrast Chest CT at 18-24 months. If patient is low risk for malignancy, non-contrast Chest CT at 18-24 months is optional. These guidelines do not apply to immunocompromised patients and patients with cancer. Follow up in patients with significant comorbidities as clinically warranted. For lung cancer screening, adhere to Lung-RADS guidelines. Reference: Radiology. 2017; 284(1):228-43. 2. Prominent right axillary nodes  measuring up to 12 mm. There is also an 11 mm left axillary node. These are nonspecific. Recommend attention at follow-up. 3. Coronary artery calcifications. Electronically Signed   By: Narda Rutherford M.D.   On: 05/24/2023 22:34   ECHOCARDIOGRAM COMPLETE  Result Date: 05/24/2023    ECHOCARDIOGRAM REPORT   Patient Name:   Ray Bradley Date of Exam: 05/24/2023 Medical Rec #:  [id number]       Height:       70.0 in Accession #:    [id number]      Weight:       263.4 lb Date of Birth:  [birth year]      BSA:          2.347 m Patient Age:    51 years        BP:           97/86 mmHg Patient Gender: M               HR:           111 bpm. Exam Location:  Inpatient Procedure: 2D Echo, Cardiac Doppler, Color Doppler and Intracardiac            Opacification Agent Indications:    A fib  History:        Patient has no prior history of Echocardiogram examinations.                 Arrythmias:Atrial Fibrillation, Signs/Symptoms:septic shock;                 Risk Factors:Hypertension and Dyslipidemia.  Sonographer:    Melissa Morford RDCS (AE, PE) Referring Phys: [id number] SUBRINA SUNDIL IMPRESSIONS  1. Left ventricular ejection fraction, by estimation, is 70 to 75%. The left ventricle has hyperdynamic function. The left ventricle has no regional wall motion abnormalities. Left ventricular diastolic parameters are consistent with Grade I diastolic dysfunction (impaired relaxation).  2. Right ventricular systolic function is normal. The right ventricular size is normal.  3. The mitral valve is normal in structure. No evidence of mitral valve regurgitation. No evidence of mitral stenosis.  4. The aortic valve is normal in structure. Aortic valve regurgitation is not visualized. No aortic stenosis is present.  5. The inferior vena cava The infferior vena cava is collapsed, consistent with low left atrial pressure. Conclusion(s)/Recommendation(s): The study suggests relative hypovolemia. Rhythm during the study was sinus tachycardia.  FINDINGS  Left Ventricle: Left ventricular ejection fraction, by estimation, is 70 to 75%. The left ventricle has hyperdynamic function. The left ventricle has no regional wall motion abnormalities. The left ventricular internal cavity size was normal in size. There is no left ventricular hypertrophy. Left ventricular diastolic parameters are consistent with Grade I diastolic dysfunction (impaired relaxation). Right Ventricle: The right ventricular size is normal. No increase in right ventricular wall thickness. Right ventricular systolic function is  normal. Left Atrium: Left atrial size was normal in size. Right Atrium: Right atrial size was normal in size. Pericardium: There is no evidence of pericardial effusion. Mitral Valve: The mitral valve is normal in structure. No evidence of mitral valve regurgitation. No evidence of mitral valve stenosis. Tricuspid Valve: The tricuspid valve is normal in structure. Tricuspid valve regurgitation is not demonstrated. No evidence of tricuspid stenosis. Aortic Valve: The aortic valve is normal in structure. Aortic valve regurgitation is not visualized. No aortic stenosis is present. Pulmonic Valve: The pulmonic valve was normal in structure. Pulmonic valve regurgitation is not visualized. No evidence of pulmonic stenosis. Aorta: The aortic root is normal in size and structure. Venous: The inferior vena cava The infferior vena cava is collapsed, consistent with low left atrial pressure. IAS/Shunts: No atrial level shunt detected by color flow Doppler.  LEFT VENTRICLE PLAX 2D LVIDd:         4.70 cm   Diastology LVIDs:         3.60 cm   LV e' medial:    8.38 cm/s LV PW:         1.10 cm   LV E/e' medial:  6.5 LV IVS:        1.00 cm   LV e' lateral:   11.40 cm/s LVOT diam:     2.20 cm   LV E/e' lateral: 4.8 LV SV:         85 LV SV Index:   36 LVOT Area:     3.80 cm  RIGHT VENTRICLE RV S prime:     11.30 cm/s TAPSE (M-mode): 1.7 cm LEFT ATRIUM             Index        RIGHT ATRIUM            Index LA diam:        4.00 cm 1.70 cm/m   RA Area:     15.00 cm LA Vol (A2C):   67.1 ml 28.59 ml/m  RA Volume:   38.10 ml  16.24 ml/m LA Vol (A4C):   61.4 ml 26.16 ml/m LA Biplane Vol: 64.3 ml 27.40 ml/m  AORTIC VALVE LVOT Vmax:   110.00 cm/s LVOT Vmean:  84.000 cm/s LVOT VTI:    0.224 m  AORTA Ao Root diam: 3.40 cm MITRAL VALVE MV Area (PHT): 6.32 cm    SHUNTS MV Decel Time: 120 msec    Systemic VTI:  0.22 m MV E velocity: 54.70 cm/s  Systemic Diam: 2.20 cm MV A velocity: 74.60 cm/s MV E/A ratio:  0.73 Mihai Croitoru MD Electronically signed by Thurmon Fair MD Signature Date/Time: 05/24/2023/10:30:49 AM    Final    CT L-SPINE NO CHARGE  Result Date: 05/24/2023 CLINICAL DATA:  Insert Osteomyelitis, lumbar Lumbar 4 to lumbar 5 vertebra septic arthritis EXAM: CT LUMBAR SPINE WITHOUT CONTRAST TECHNIQUE: Multidetector CT imaging of the lumbar spine was performed without intravenous contrast administration. Multiplanar CT image reconstructions were also generated. RADIATION DOSE REDUCTION: This exam was performed according to the departmental dose-optimization program which includes automated exposure control, adjustment of the mA and/or kV according to patient size and/or use of iterative reconstruction technique. COMPARISON:  CT abdomen and pelvis today and 10/21/2022. FINDINGS: Segmentation: 5 lumbar type vertebrae. Alignment: Normal Vertebrae: No acute fracture or focal pathologic process. Paraspinal and other soft tissues: Normal Disc levels: Disc spaces maintained. Mild right facet arthropathy with erosions on the right at L4-5 as seen on earlier study.  This is stable when compared to prior CT from February of this year. This is most compatible with facet arthritis. No evidence of infection. IMPRESSION: No acute bony abnormality. No evidence of discitis or osteomyelitis by CT. Degenerative right facet arthropathy at L4-5 unchanged since prior study. Electronically Signed   By: Charlett Nose M.D.    On: 05/24/2023 00:44   CT ABDOMEN PELVIS WO CONTRAST  Result Date: 05/23/2023 CLINICAL DATA:  Sepsis.  Lower back pain. EXAM: CT ABDOMEN AND PELVIS WITHOUT CONTRAST TECHNIQUE: Multidetector CT imaging of the abdomen and pelvis was performed following the standard protocol without IV contrast. RADIATION DOSE REDUCTION: This exam was performed according to the departmental dose-optimization program which includes automated exposure control, adjustment of the mA and/or kV according to patient size and/or use of iterative reconstruction technique. COMPARISON:  MRI abdomen 10/22/2022 and CT abdomen and pelvis 10/21/2022 FINDINGS: Lower chest: Multiple new pulmonary nodules are present in the lung bases. For example in the left lower lobe posteriorly on series 3/image 17 measuring 7 mm. Additional nodules are present in the right lower lobe on 3/10 and 3/7 and right middle lobe on 3/5. There is mild ground-glass opacity about each nodule. Hepatobiliary: Cholecystectomy. Unremarkable noncontrast appearance of the liver. No biliary dilation. Pancreas: Unremarkable. Spleen: Unremarkable. Adrenals/Urinary Tract: Stable adrenal glands. No urinary calculi or hydronephrosis. Nondistended thick-walled bladder. Stomach/Bowel: Normal caliber large and small bowel. No bowel wall thickening. The appendix is normal.Stomach is within normal limits. Vascular/Lymphatic: Aortic atherosclerosis. No enlarged abdominal or pelvic lymph nodes. Reproductive: Unremarkable. Other: No free intraperitoneal fluid or air. Musculoskeletal: No acute fracture. Right L4-L5 facet arthritis with mild erosive change. IMPRESSION: 1. Right L4-L5 facet joint arthritis with mild erosive change. Correlation with site of pain is recommended. If there is concern for septic arthritis, MRI is recommended. 2. Multiple pulmonary nodules are present bilaterally measuring up to 7 mm with surrounding ground-glass opacity. This is nonspecific and can be seen in a  variety of infectious, neoplastic, and inflammatory conditions including septic emboli. Consider dedicated CT chest for further evaluation. 3. Nondistended thick-walled bladder. Correlate with urinalysis. Aortic Atherosclerosis (ICD10-I70.0). Electronically Signed   By: Minerva Fester M.D.   On: 05/23/2023 23:20   DG Chest Port 1 View  Result Date: 05/23/2023 CLINICAL DATA:  Questionable sepsis - evaluate for abnormality EXAM: PORTABLE CHEST - 1 VIEW COMPARISON:  11/19/2013 FINDINGS: Low lung volumes. Mild perihilar and bibasilar interstitial prominence. No confluent airspace disease. Heart size and mediastinal contours are within normal limits. No effusion. Visualized bones unremarkable. IMPRESSION: Low lung volumes with mild perihilar and bibasilar interstitial prominence. Electronically Signed   By: Corlis Leak M.D.   On: 05/23/2023 21:32   DG Shoulder Left  Result Date: 04/26/2023 CLINICAL DATA:  fall, shoulder pain EXAM: LEFT SHOULDER - 2+ VIEW COMPARISON:  None Available. FINDINGS: There is no evidence of fracture or dislocation. There is no evidence of severe arthropathy or other focal bone abnormality. Soft tissues are unremarkable. IMPRESSION: Negative. Electronically Signed   By: Tish Frederickson M.D.   On: 04/26/2023 16:46     Rexene Alberts, MSN, NP-C [facility] Pager: [phone]  05/26/2023 8:55 AM

## 2023-05-27 DIAGNOSIS — A419 Sepsis, unspecified organism: Secondary | ICD-10-CM | POA: Diagnosis not present

## 2023-05-27 DIAGNOSIS — R6521 Severe sepsis with septic shock: Secondary | ICD-10-CM | POA: Diagnosis not present

## 2023-05-27 LAB — COMPREHENSIVE METABOLIC PANEL
ALT: 73 U/L — ABNORMAL HIGH (ref 0–44)
AST: 39 U/L (ref 15–41)
Albumin: 2.4 g/dL — ABNORMAL LOW (ref 3.5–5.0)
Alkaline Phosphatase: 153 U/L — ABNORMAL HIGH (ref 38–126)
Anion gap: 15 (ref 5–15)
BUN: 84 mg/dL — ABNORMAL HIGH (ref 6–20)
CO2: 21 mmol/L — ABNORMAL LOW (ref 22–32)
Calcium: 8.7 mg/dL — ABNORMAL LOW (ref 8.9–10.3)
Chloride: 98 mmol/L (ref 98–111)
Creatinine, Ser: 11.79 mg/dL — ABNORMAL HIGH (ref 0.61–1.24)
GFR, Estimated: 5 mL/min — ABNORMAL LOW (ref >60)
Glucose, Bld: 129 mg/dL — ABNORMAL HIGH (ref 70–99)
Potassium: 3 mmol/L — ABNORMAL LOW (ref 3.5–5.1)
Sodium: 134 mmol/L — ABNORMAL LOW (ref 135–145)
Total Bilirubin: 4.7 mg/dL — ABNORMAL HIGH (ref 0.3–1.2)
Total Protein: 6.1 g/dL — ABNORMAL LOW (ref 6.5–8.1)

## 2023-05-27 LAB — HEMOGLOBIN A1C
Hgb A1c MFr Bld: 6.7 % — ABNORMAL HIGH (ref 4.8–5.6)
Mean Plasma Glucose: 145.59 mg/dL

## 2023-05-27 LAB — CBC
HCT: 25.6 % — ABNORMAL LOW (ref 39.0–52.0)
Hemoglobin: 8.7 g/dL — ABNORMAL LOW (ref 13.0–17.0)
MCH: 27.4 pg (ref 26.0–34.0)
MCHC: 34 g/dL (ref 30.0–36.0)
MCV: 80.5 fL (ref 80.0–100.0)
Platelets: 100 10*3/uL — ABNORMAL LOW (ref 150–400)
RBC: 3.18 MIL/uL — ABNORMAL LOW (ref 4.22–5.81)
RDW: 15.1 % (ref 11.5–15.5)
WBC: 24.2 10*3/uL — ABNORMAL HIGH (ref 4.0–10.5)
nRBC: 0 % (ref 0.0–0.2)

## 2023-05-27 LAB — MAGNESIUM
Magnesium: 2.8 mg/dL — ABNORMAL HIGH (ref 1.7–2.4)
Magnesium: 3 mg/dL — ABNORMAL HIGH (ref 1.7–2.4)

## 2023-05-27 LAB — GLUCOSE, CAPILLARY
Glucose-Capillary: 118 mg/dL — ABNORMAL HIGH (ref 70–99)
Glucose-Capillary: 127 mg/dL — ABNORMAL HIGH (ref 70–99)

## 2023-05-27 MED ORDER — ALTEPLASE 2 MG IJ SOLR: 2.0000 mg | Freq: Once | INTRAMUSCULAR | Status: DC | PRN

## 2023-05-27 MED ORDER — HEPARIN SODIUM (PORCINE) 1000 UNIT/ML DIALYSIS
4000.0000 [IU] | Freq: Once | INTRAMUSCULAR | Status: AC
  Administered 2023-05-27: 4000 [IU] via INTRAVENOUS_CENTRAL
  Filled 2023-05-27: qty 4

## 2023-05-27 MED ORDER — CHLORHEXIDINE GLUCONATE CLOTH 2 % EX PADS
6.0000 | MEDICATED_PAD | Freq: Every day | CUTANEOUS | Status: DC
  Administered 2023-05-28: 6 via TOPICAL

## 2023-05-27 MED ORDER — HEPARIN SODIUM (PORCINE) 1000 UNIT/ML DIALYSIS: 1000.0000 [IU] | INTRAMUSCULAR | Status: DC | PRN

## 2023-05-27 MED ORDER — ANTICOAGULANT SODIUM CITRATE 4% (200MG/5ML) IV SOLN
5.0000 mL | Status: DC | PRN
  Filled 2023-05-27: qty 5

## 2023-05-27 MED ORDER — LIDOCAINE HCL (PF) 1 % IJ SOLN: 5.0000 mL | INTRAMUSCULAR | Status: DC | PRN

## 2023-05-27 MED ORDER — NEPRO/CARBSTEADY PO LIQD: 237.0000 mL | ORAL | Status: DC | PRN

## 2023-05-27 MED ORDER — PENTAFLUOROPROP-TETRAFLUOROETH EX AERO: 1.0000 | INHALATION_SPRAY | CUTANEOUS | Status: DC | PRN

## 2023-05-27 MED ORDER — LIDOCAINE-PRILOCAINE 2.5-2.5 % EX CREA: 1.0000 | TOPICAL_CREAM | CUTANEOUS | Status: DC | PRN

## 2023-05-27 NOTE — Progress Notes (Signed)
Got report from night HD nurse pt refused. Called this am spoke with Molli Hazard RN to get report so the pt can come up for HD. Molli Hazard RN spoke with pt he is refusing HD at this time to speak with doctor with hopes to d/c today and dialyze at home.

## 2023-05-27 NOTE — Progress Notes (Signed)
[facility] Progress Note   Subjective:   Patient seen in room, pleasant demeanor. Endorses some frustration with dialysis cannulation yesterday and subsequently refused treatment. Otherwise no complaints, eager for discharge.   Objective Vitals:   05/27/23 0400 05/27/23 0500 05/27/23 0806 05/27/23 1200  BP: 137/69  (!) 142/77 (!) 147/63  Pulse: 87  87 85  Resp: 20  19 15   Temp:   97.9 F (36.6 C) 97.9 F (36.6 C)  TempSrc:   Oral Oral  SpO2: 91%  95% 94%  Weight:  124.1 kg    Height:       Physical Exam General: Well-appearing, well-nourished adult male lying on hospital bed in no acute distress.  Heart: RRR no m/r/g Lungs: CTABL Abdomen: Soft, non-tender, non-distended.   Extremities: No edema in bilateral upper and lower extremities. Dialysis Access: LLAAVF - Button Hole - in good repair w/ + bruit/thrill, no edema/erythema/warmth  Additional Objective Labs: Basic Metabolic Panel: Recent Labs  Lab 05/25/23 1600 05/26/23 0614 05/27/23 0341  NA 134* 135 134*  K 4.0 3.5 3.0*  CL 97* 95* 98  CO2 21* 21* 21*  GLUCOSE 79 87 129*  BUN 49* 65* 84*  CREATININE 8.02* 10.10* 11.79*  CALCIUM 7.8* 8.6* 8.7*  PHOS  --  5.3*  --    Liver Function Tests: Recent Labs  Lab 05/25/23 0231 05/26/23 0614 05/27/23 0341  AST 104* 53* 39  ALT 160* 98* 73*  ALKPHOS 111 129* 153*  BILITOT 3.4* 4.1* 4.7*  PROT 6.5 6.0* 6.1*  ALBUMIN 2.9* 2.4* 2.4*    CBC: Recent Labs  Lab 05/23/23 2155 05/24/23 0218 05/25/23 0231 05/26/23 0614 05/27/23 0341  WBC 1.6* 3.4* 24.1* 26.4* 24.2*  NEUTROABS 1.3*  --   --   --   --   HGB 9.6* 9.4* 9.4* 9.1* 8.7*  HCT 29.4* 29.0* 27.8* 26.7* 25.6*  MCV 84.2 85.5 82.2 82.7 80.5  PLT 182 171 126* 100* 100*   Blood Culture    Component Value Date/Time   SDES BLOOD SITE NOT SPECIFIED 05/23/2023 2055   SPECREQUEST  05/23/2023 2055    BOTTLES DRAWN AEROBIC AND ANAEROBIC Blood Culture results may not be optimal due to an  inadequate volume of blood received in culture bottles   CULT  05/23/2023 2055    CULTURE REINCUBATED FOR BETTER GROWTH Performed at [facility], [address] [place], [place] [address]    REPTSTATUS PENDING 05/23/2023 2055    CBG: Recent Labs  Lab 05/25/23 2324 05/26/23 0342 05/26/23 0746 05/27/23 0834 05/27/23 1157  GLUCAP 94 83 78 118* 127*   Studies/Results: No results found. Medications:  sodium chloride 250 mL (05/26/23 1844)   anticoagulant sodium citrate     anticoagulant sodium citrate     anticoagulant sodium citrate     ceFEPime (MAXIPIME) IV 1 g (05/26/23 1845)   metronidazole 500 mg (05/27/23 0556)    aspirin EC  81 mg Oral Daily   Chlorhexidine Gluconate Cloth  6 each Topical Q0600   Chlorhexidine Gluconate Cloth  6 each Topical Q0600   [START ON 05/28/2023] Chlorhexidine Gluconate Cloth  6 each Topical Q0600   ferric citrate  420 mg Oral TID WC   heparin  4,000 Units Dialysis Once in dialysis   heparin  4,000 Units Dialysis Once in dialysis   heparin  5,000 Units Subcutaneous Q8H    Dialysis Orders: Home HD 4x per week  3.5h   116kg  BFR 400  Heparin 4000 (1 or 2 doses)   L AVF  Assessment/Plan: ESRD: Patient refused last inpatient HD, but has agreed to attend next treatment today, orders placed for IHD today by Dr. Arlean Hopping. I think he is just anxious to go home. He expressed some concern about cannulation of his buttonhole access. If at all possible he should be self-cannulating to ensure he is comfortable and maintains adherence. Otherwise continue to trend Renal Panel and CBCs over the remainder of his stay. Continue renal diet, 1200 mL fluid restriction, and avoid venipuncture/BP measurement on access arm.  Serratia Bacteremia: resolving. BP is good off pressors x72 h. Still on Abx. Managed by ID team.  Anemia of CKD: Hgb stable. Will follow. 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 Continue phos binders.  HTN: SBP in the high 140's today. DBP in mid  60s-70s. Will likely need to go back on antiHTNsives once cleared by ID.   Santiago Bumpers, PA-S2 05/27/2023, 1:12 PM  [employer]

## 2023-05-27 NOTE — Care Management Important Message (Signed)
Important Message  Patient Details  Name: Ray Bradley MRN: [id number] Date of Birth: 1970-11-09   Medicare Important Message Given:  Yes     [name] [name] 05/27/2023, 3:36 PM

## 2023-05-27 NOTE — Progress Notes (Signed)
PROGRESS NOTE    Ray Bradley  WGN:[id number] DOB: 1971-01-03 DOA: 05/23/2023 PCP: Tally Joe, MD   Chief Complaint  Patient presents with   Chest Pain    Brief Narrative:   Ray Bradley is a 52 y.o. male who has a PMH as below including ESRD on HD at home four times per week. He presented to [facility] 9/2 with chest pain after having difficulties with dialysate on machine.  Was able to get it working but then developed chills so he presented to ER.  N/V with 1 episode of diarrhea, febrile and hypotensive with elevated lactate but improved after fluid bolus.  Denied any recent sick contact/ exposures.  CT abd/pelv that showed right L4-L5 facet joint arthritis, multiple pulmonary nodules with surrounding GGO, thick walled GB. CT L spine was negative for acute processes.  P patient was admitted to ICU due to progressive hypotension, with watery diarrhea, no nausea, no vomiting, tolerating p.o. admitted to ICU due to pressor support requirement, workup significant for bacteremia due to Serratia, he is weaned off pressors, transferred to progressive unit 05/27/2023.    Assessment & Plan:   Principal Problem:   Septic shock (HCC) Active Problems:   ESRD (end stage renal disease) (HCC)   Hyperlipidemia   Essential hypertension   Leukopenia   Paroxysmal atrial fibrillation (HCC)   Sepsis (HCC)   Shock, present on admission, secondary to Serratia bacteremia and possible aspiration pneumonia -Possible component of hypovolemia as well, this has resolved -Required pressors initially, but he remains off pressor since 9/4 -ID consult greatly appreciated, patient would prefer to go home with p.o. antibiotic instead of receiving IV antibiotics during HD sessions, awaiting final sensitivities and susceptibilities  Possible aspiration pneumonitis/ PNA w/ bilateral pulmonary nodules concerning for infectious vs inflammatory changes Hypoxia, resolved - off O2, continue supplemental O2 prn,  sat >  92%.   - abx as above will cover for aspiration - if urine obtained, send for urine strep - OOB today, pulm hygiene/ IS - pulmonary hygiene, IS, mobilize as able - will need f/u imaging to ensure resolution of nodules in 4-6 weeks    Mild transaminitis, improving with ongoing hyperbilirubinemia  Thrombocytopenia- suspected related to sepsis  - trend CBC/ monitor, stable - continue supportive care as above, abx as above   ESRD on home HD AGMA, improved - on home HD for 4 years.  Dry weight 116kg.  Last full treatment at home on 8/31.  iHD 9/4 -Renal is following, for HD today     Hypomagnesemia,  -Repleted     Chest pain - resolved. Seems musculoskeletal  Left shoulder pain- chronic/ prior injury - Initial EKG A.flutter but currently sinus tach on exam. Trop negative.  ?PAF? But not on any AC but do not see this on EMR - no RWA on TTE - lidocaine patch prn    HTN - normal SBP 120-150 - cont to hold amlodipine, coreg, and losartan as BP remains 100-120 - cont ASA     DMT2 - SSI d/c given borderline glucose but poor PO intake. - A1c in am, previous A1c 7 in February  - hold ozempic      Chronic microcytic anemia of chronic disease - Hgb stable, no evidence of bleeding - trend CBC     DVT prophylaxis: SCDs Code Status: Full Family Communication: Discussed with multiple family members at bedside Disposition:   Status is: Inpatient    Consultants:  PCCM ID Renal   Subjective:  He  reports nausea and poor appetite Objective: Vitals:   05/27/23 1359 05/27/23 1406 05/27/23 1440 05/27/23 1500  BP: (!) 158/86  137/84 (!) 151/75  Pulse: 85  86 83  Resp: 17  17 17   Temp: 98.3 F (36.8 C)     TempSrc: Oral     SpO2: 98%  96% 97%  Weight:  120.8 kg    Height:        Intake/Output Summary (Last 24 hours) at 05/27/2023 1540 Last data filed at 05/26/2023 1600 Gross per 24 hour  Intake 120 ml  Output --  Net 120 ml   Filed Weights   05/26/23 0458 05/27/23  0500 05/27/23 1406  Weight: 93 kg 124.1 kg 120.8 kg    Examination:  Awake Alert, Oriented X 3, No new F.N deficits, Normal affect Symmetrical Chest wall movement, Good air movement bilaterally, CTAB RRR,No Gallops,Rubs or new Murmurs, No Parasternal Heave +ve B.Sounds, Abd Soft, No tenderness, No rebound - guarding or rigidity. No Cyanosis, Clubbing or edema, No new Rash or bruise      Data Reviewed: I have personally reviewed following labs and imaging studies  CBC: Recent Labs  Lab 05/23/23 2155 05/24/23 0218 05/25/23 0231 05/26/23 0614 05/27/23 0341  WBC 1.6* 3.4* 24.1* 26.4* 24.2*  NEUTROABS 1.3*  --   --   --   --   HGB 9.6* 9.4* 9.4* 9.1* 8.7*  HCT 29.4* 29.0* 27.8* 26.7* 25.6*  MCV 84.2 85.5 82.2 82.7 80.5  PLT 182 171 126* 100* 100*    Basic Metabolic Panel: Recent Labs  Lab 05/24/23 1145 05/25/23 0231 05/25/23 0231 05/25/23 1600 05/25/23 2355 05/26/23 0614 05/27/23 0012 05/27/23 0341 05/27/23 1147  NA 135 134*  --  134*  --  135  --  134*  --   K 3.9 4.0  --  4.0  --  3.5  --  3.0*  --   CL 96* 94*  --  97*  --  95*  --  98  --   CO2 19* 17*  --  21*  --  21*  --  21*  --   GLUCOSE 146* 102*  --  79  --  87  --  129*  --   BUN 71* 87*  --  49*  --  65*  --  84*  --   CREATININE 12.66* 14.29*  --  8.02*  --  10.10*  --  11.79*  --   CALCIUM 8.0* 7.6*  --  7.8*  --  8.6*  --  8.7*  --   MG  --  1.5*   < > 1.5* 2.5* 2.6* 2.8*  --  3.0*  PHOS  --   --   --   --   --  5.3*  --   --   --    < > = values in this interval not displayed.    GFR: Estimated Creatinine Clearance: 9.8 mL/min (A) (by C-G formula based on SCr of 11.79 mg/dL (H)).  Liver Function Tests: Recent Labs  Lab 05/24/23 0218 05/24/23 1145 05/25/23 0231 05/26/23 0614 05/27/23 0341  AST 158* 121* 104* 53* 39  ALT 147* 152* 160* 98* 73*  ALKPHOS 124 121 111 129* 153*  BILITOT 1.8* 2.6* 3.4* 4.1* 4.7*  PROT 6.2* 6.5 6.5 6.0* 6.1*  ALBUMIN 2.9* 2.9* 2.9* 2.4* 2.4*     CBG: Recent Labs  Lab 05/25/23 2324 05/26/23 0342 05/26/23 0746 05/27/23 0834 05/27/23 1157  GLUCAP 94 83  78 118* 127*     Recent Results (from the past 240 hour(s))  Resp panel by RT-PCR (RSV, Flu A&B, Covid) Anterior Nasal Swab     Status: None   Collection Time: 05/23/23  8:52 PM   Specimen: Anterior Nasal Swab  Result Value Ref Range Status   SARS Coronavirus 2 by RT PCR NEGATIVE NEGATIVE Final   Influenza A by PCR NEGATIVE NEGATIVE Final   Influenza B by PCR NEGATIVE NEGATIVE Final    Comment: (NOTE) The Xpert Xpress SARS-CoV-2/FLU/RSV plus assay is intended as an aid in the diagnosis of influenza from Nasopharyngeal swab specimens and should not be used as a sole basis for treatment. Nasal washings and aspirates are unacceptable for Xpert Xpress SARS-CoV-2/FLU/RSV testing.  Fact Sheet for Patients: BloggerCourse.com  Fact Sheet for Healthcare Providers: SeriousBroker.it  This test is not yet approved or cleared by the [place] FDA and has been authorized for detection and/or diagnosis of SARS-CoV-2 by FDA under an Emergency Use Authorization (EUA). This EUA will remain in effect (meaning this test can be used) for the duration of the COVID-19 declaration under Section 564(b)(1) of the Act, 21 U.S.C. section 360bbb-3(b)(1), unless the authorization is terminated or revoked.     Resp Syncytial Virus by PCR NEGATIVE NEGATIVE Final    Comment: (NOTE) Fact Sheet for Patients: BloggerCourse.com  Fact Sheet for Healthcare Providers: SeriousBroker.it  This test is not yet approved or cleared by the [place] FDA and has been authorized for detection and/or diagnosis of SARS-CoV-2 by FDA under an Emergency Use Authorization (EUA). This EUA will remain in effect (meaning this test can be used) for the duration of the COVID-19 declaration under Section  564(b)(1) of the Act, 21 U.S.C. section 360bbb-3(b)(1), unless the authorization is terminated or revoked.  Performed at [facility], [address], [place], [place] [address]   Blood Culture (routine x 2)     Status: Abnormal (Preliminary result)   Collection Time: 05/23/23  8:52 PM   Specimen: BLOOD  Result Value Ref Range Status   Specimen Description BLOOD SITE NOT SPECIFIED  Final   Special Requests   Final    BOTTLES DRAWN AEROBIC AND ANAEROBIC Blood Culture results may not be optimal due to an inadequate volume of blood received in culture bottles   Culture  Setup Time   Final    GRAM NEGATIVE RODS GRAM POSITIVE RODS BOTTLES DRAWN AEROBIC ONLY CRITICAL RESULT CALLED TO, READ BACK BY AND VERIFIED WITH: BRYK,PHARMD@2349  05/24/23 MK    Culture (A)  Final    SERRATIA MARCESCENS CULTURE REINCUBATED FOR BETTER GROWTH Performed at [facility], [address] [place], [place] [address]    Report Status PENDING  Incomplete   Organism ID, Bacteria SERRATIA MARCESCENS  Final      Susceptibility   Serratia marcescens - MIC*    CEFEPIME <=0.12 SENSITIVE Sensitive     CEFTAZIDIME <=1 SENSITIVE Sensitive     CEFTRIAXONE <=0.25 SENSITIVE Sensitive     CIPROFLOXACIN <=0.25 SENSITIVE Sensitive     GENTAMICIN <=1 SENSITIVE Sensitive     TRIMETH/SULFA <=20 SENSITIVE Sensitive     * SERRATIA MARCESCENS  Blood Culture ID Panel (Reflexed)     Status: Abnormal   Collection Time: 05/23/23  8:52 PM  Result Value Ref Range Status   Enterococcus faecalis NOT DETECTED NOT DETECTED Final   Enterococcus Faecium NOT DETECTED NOT DETECTED Final   Listeria monocytogenes NOT DETECTED NOT DETECTED Final  Staphylococcus species NOT DETECTED NOT DETECTED Final   Staphylococcus aureus (BCID) NOT DETECTED NOT DETECTED Final   Staphylococcus epidermidis NOT DETECTED NOT DETECTED Final   Staphylococcus lugdunensis NOT DETECTED NOT DETECTED Final   Streptococcus species NOT DETECTED NOT  DETECTED Final   Streptococcus agalactiae NOT DETECTED NOT DETECTED Final   Streptococcus pneumoniae NOT DETECTED NOT DETECTED Final   Streptococcus pyogenes NOT DETECTED NOT DETECTED Final   A.calcoaceticus-baumannii NOT DETECTED NOT DETECTED Final   Bacteroides fragilis NOT DETECTED NOT DETECTED Final   Enterobacterales DETECTED (A) NOT DETECTED Final    Comment: Enterobacterales represent a large order of gram negative bacteria, not a single organism. CRITICAL RESULT CALLED TO, READ BACK BY AND VERIFIED WITH: V BRYK,PHARMD@2349  05/24/23 MK    Enterobacter cloacae complex NOT DETECTED NOT DETECTED Final   Escherichia coli NOT DETECTED NOT DETECTED Final   Klebsiella aerogenes NOT DETECTED NOT DETECTED Final   Klebsiella oxytoca NOT DETECTED NOT DETECTED Final   Klebsiella pneumoniae NOT DETECTED NOT DETECTED Final   Proteus species NOT DETECTED NOT DETECTED Final   Salmonella species NOT DETECTED NOT DETECTED Final   Serratia marcescens DETECTED (A) NOT DETECTED Final    Comment: CRITICAL RESULT CALLED TO, READ BACK BY AND VERIFIED WITH: V BRYK,PHARMD@2349  05/24/23 MK    Haemophilus influenzae NOT DETECTED NOT DETECTED Final   Neisseria meningitidis NOT DETECTED NOT DETECTED Final   Pseudomonas aeruginosa NOT DETECTED NOT DETECTED Final   Stenotrophomonas maltophilia NOT DETECTED NOT DETECTED Final   Candida albicans NOT DETECTED NOT DETECTED Final   Candida auris NOT DETECTED NOT DETECTED Final   Candida glabrata NOT DETECTED NOT DETECTED Final   Candida krusei NOT DETECTED NOT DETECTED Final   Candida parapsilosis NOT DETECTED NOT DETECTED Final   Candida tropicalis NOT DETECTED NOT DETECTED Final   Cryptococcus neoformans/gattii NOT DETECTED NOT DETECTED Final   CTX-M ESBL NOT DETECTED NOT DETECTED Final   Carbapenem resistance IMP NOT DETECTED NOT DETECTED Final   Carbapenem resistance KPC NOT DETECTED NOT DETECTED Final   Carbapenem resistance NDM NOT DETECTED NOT DETECTED  Final   Carbapenem resist OXA 48 LIKE NOT DETECTED NOT DETECTED Final   Carbapenem resistance VIM NOT DETECTED NOT DETECTED Final    Comment: Performed at [facility], [address], [place], [place] [address]  Blood Culture (routine x 2)     Status: None (Preliminary result)   Collection Time: 05/23/23  8:55 PM   Specimen: BLOOD  Result Value Ref Range Status   Specimen Description BLOOD SITE NOT SPECIFIED  Final   Special Requests   Final    BOTTLES DRAWN AEROBIC AND ANAEROBIC Blood Culture results may not be optimal due to an inadequate volume of blood received in culture bottles   Culture  Setup Time   Final    GRAM POSITIVE RODS ANAEROBIC BOTTLE ONLY CRITICAL RESULT CALLED TO, READ BACK BY AND VERIFIED WITH: PHARMD J. MILLEN [id number] @ 1719 FH     Culture   Final    CULTURE REINCUBATED FOR BETTER GROWTH Performed at [facility], [address], [place], [place] [address]    Report Status PENDING  Incomplete  Gastrointestinal Panel by PCR , Stool     Status: None   Collection Time: 05/24/23  6:13 PM   Specimen: Stool  Result Value Ref Range Status   Campylobacter species NOT DETECTED NOT DETECTED Final   Plesimonas shigelloides NOT DETECTED NOT DETECTED Final   Salmonella species NOT  DETECTED NOT DETECTED Final   Yersinia enterocolitica NOT DETECTED NOT DETECTED Final   Vibrio species NOT DETECTED NOT DETECTED Final   Vibrio cholerae NOT DETECTED NOT DETECTED Final   Enteroaggregative E coli (EAEC) NOT DETECTED NOT DETECTED Final   Enteropathogenic E coli (EPEC) NOT DETECTED NOT DETECTED Final   Enterotoxigenic E coli (ETEC) NOT DETECTED NOT DETECTED Final   Shiga like toxin producing E coli (STEC) NOT DETECTED NOT DETECTED Final   Shigella/Enteroinvasive E coli (EIEC) NOT DETECTED NOT DETECTED Final   Cryptosporidium NOT DETECTED NOT DETECTED Final   Cyclospora cayetanensis NOT DETECTED NOT DETECTED Final   Entamoeba histolytica NOT DETECTED NOT DETECTED Final    Giardia lamblia NOT DETECTED NOT DETECTED Final   Adenovirus F40/41 NOT DETECTED NOT DETECTED Final   Astrovirus NOT DETECTED NOT DETECTED Final   Norovirus GI/GII NOT DETECTED NOT DETECTED Final   Rotavirus A NOT DETECTED NOT DETECTED Final   Sapovirus (I, II, IV, and V) NOT DETECTED NOT DETECTED Final    Comment: Performed at [facility], [address] [place], [place] [address]  OVA + PARASITE EXAM     Status: None   Collection Time: 05/24/23  6:13 PM   Specimen: Stool  Result Value Ref Range Status   OVA + PARASITE EXAM Final report  Final    Comment: (NOTE) These results were obtained using wet preparation(s) and trichrome stained smear. This test does not include testing for Cryptosporidium parvum, Cyclospora, or Microsporidia. Performed At: [facility] [address] [place], [place] [id number] Jolene Schimke MD QI:[id number]    Source of Sample STOOL  Final    Comment: Performed at [facility], [address], [place], [place] [address]  C Difficile Quick Screen w PCR reflex     Status: None   Collection Time: 05/24/23  6:14 PM   Specimen: STOOL  Result Value Ref Range Status   C Diff antigen NEGATIVE NEGATIVE Final   C Diff toxin NEGATIVE NEGATIVE Final   C Diff interpretation No C. difficile detected.  Final    Comment: Performed at [facility], [address], [place], [place] [address]  MRSA Next Gen by PCR, Nasal     Status: None   Collection Time: 05/24/23  8:19 PM   Specimen: Nasal Mucosa; Nasal Swab  Result Value Ref Range Status   MRSA by PCR Next Gen NOT DETECTED NOT DETECTED Final    Comment: (NOTE) The GeneXpert MRSA Assay (FDA approved for NASAL specimens only), is one component of a comprehensive MRSA colonization surveillance program. It is not intended to diagnose MRSA infection nor to guide or monitor treatment for MRSA infections. Test performance is not FDA approved in patients less than 34 years old. Performed  at [facility], [address], [place], [place] [address]          Radiology Studies: No results found.      Scheduled Meds:  aspirin EC  81 mg Oral Daily   Chlorhexidine Gluconate Cloth  6 each Topical Q0600   Chlorhexidine Gluconate Cloth  6 each Topical Q0600   [START ON 05/28/2023] Chlorhexidine Gluconate Cloth  6 each Topical Q0600   ferric citrate  420 mg Oral TID WC   heparin  5,000 Units Subcutaneous Q8H   Continuous Infusions:  sodium chloride 250 mL (05/26/23 1844)   anticoagulant sodium citrate     ceFEPime (MAXIPIME) IV 1 g (05/26/23 1845)   metronidazole 500 mg (  05/27/23 0556)     LOS: 3 days      Huey Bienenstock, MD [facility]   To contact the attending provider between 7A-7P or the covering provider during after hours 7P-7A, please log into the web site [email or web address] and access using universal [place] password for that web site. If you do not have the password, please call the hospital operator.  05/27/2023, 3:40 PM

## 2023-05-27 NOTE — Progress Notes (Signed)
Received patient  in bed.,awake,alert and oriented x 4. Consent verified.  Access used: Left lower AVF with buttonholes,patient has brought his own needles and self cannulated.  Medicine given : Heparin 4,000 units pre-run dose.  Duration of treatment : 3.5 hours.  Fluid removed: 2.8 liters.  Hemo comment : Tolerated treatment.  Hand off to the patient's nurse- Albin Felling.

## 2023-05-27 NOTE — Plan of Care (Signed)

## 2023-05-27 NOTE — Progress Notes (Signed)
Patient refusing dialysis treatment at this time. Patient states he would prefer to do treatment at home after discharge tomorrow. He states that if he can not be discharged tomorrow, he will think about taking treatment here in hospital.

## 2023-05-28 ENCOUNTER — Other Ambulatory Visit (HOSPITAL_COMMUNITY): Payer: Self-pay

## 2023-05-28 DIAGNOSIS — A419 Sepsis, unspecified organism: Secondary | ICD-10-CM | POA: Diagnosis not present

## 2023-05-28 DIAGNOSIS — R6521 Severe sepsis with septic shock: Secondary | ICD-10-CM | POA: Diagnosis not present

## 2023-05-28 LAB — CBC
HCT: 29.4 % — ABNORMAL LOW (ref 39.0–52.0)
Hemoglobin: 9.8 g/dL — ABNORMAL LOW (ref 13.0–17.0)
MCH: 27.3 pg (ref 26.0–34.0)
MCHC: 33.3 g/dL (ref 30.0–36.0)
MCV: 81.9 fL (ref 80.0–100.0)
Platelets: 103 10*3/uL — ABNORMAL LOW (ref 150–400)
RBC: 3.59 MIL/uL — ABNORMAL LOW (ref 4.22–5.81)
RDW: 15.4 % (ref 11.5–15.5)
WBC: 15.7 10*3/uL — ABNORMAL HIGH (ref 4.0–10.5)
nRBC: 0.3 % — ABNORMAL HIGH (ref 0.0–0.2)

## 2023-05-28 LAB — BASIC METABOLIC PANEL
Anion gap: 12 (ref 5–15)
BUN: 60 mg/dL — ABNORMAL HIGH (ref 6–20)
CO2: 26 mmol/L (ref 22–32)
Calcium: 8.6 mg/dL — ABNORMAL LOW (ref 8.9–10.3)
Chloride: 96 mmol/L — ABNORMAL LOW (ref 98–111)
Creatinine, Ser: 9.33 mg/dL — ABNORMAL HIGH (ref 0.61–1.24)
GFR, Estimated: 6 mL/min — ABNORMAL LOW (ref >60)
Glucose, Bld: 122 mg/dL — ABNORMAL HIGH (ref 70–99)
Potassium: 3.5 mmol/L (ref 3.5–5.1)
Sodium: 134 mmol/L — ABNORMAL LOW (ref 135–145)

## 2023-05-28 LAB — FERRITIN: Ferritin: 2144 ng/mL — ABNORMAL HIGH (ref 24–336)

## 2023-05-28 LAB — TRANSFERRIN: Transferrin: 116 mg/dL — ABNORMAL LOW (ref 180–329)

## 2023-05-28 MED ORDER — LIDOCAINE 5 % EX PTCH
1.0000 | MEDICATED_PATCH | Freq: Every day | CUTANEOUS | 0 refills | Status: AC | PRN
  Filled 2023-05-28: qty 30, 30d supply, fill #0

## 2023-05-28 MED ORDER — CIPROFLOXACIN IN D5W 400 MG/200ML IV SOLN: 400.0000 mg | INTRAVENOUS | Status: DC

## 2023-05-28 MED ORDER — METRONIDAZOLE 500 MG PO TABS
500.0000 mg | ORAL_TABLET | Freq: Two times a day (BID) | ORAL | 0 refills | Status: AC
  Filled 2023-05-28: qty 22, 11d supply, fill #0

## 2023-05-28 MED ORDER — SEVELAMER CARBONATE 800 MG PO TABS
800.0000 mg | ORAL_TABLET | Freq: Three times a day (TID) | ORAL | 0 refills | Status: AC
  Filled 2023-05-28: qty 90, 30d supply, fill #0

## 2023-05-28 MED ORDER — CIPROFLOXACIN HCL 500 MG PO TABS
500.0000 mg | ORAL_TABLET | Freq: Every day | ORAL | 0 refills | Status: DC
  Filled 2023-05-28: qty 10, 10d supply, fill #0

## 2023-05-28 MED ORDER — CIPROFLOXACIN HCL 500 MG PO TABS
500.0000 mg | ORAL_TABLET | Freq: Every day | ORAL | 0 refills | Status: AC
  Filled 2023-05-28: qty 11, 11d supply, fill #0

## 2023-05-28 MED ORDER — LOSARTAN POTASSIUM 50 MG PO TABS: 50.0000 mg | ORAL_TABLET | Freq: Every day | ORAL | Status: AC

## 2023-05-28 MED ORDER — METRONIDAZOLE 500 MG PO TABS
500.0000 mg | ORAL_TABLET | Freq: Two times a day (BID) | ORAL | 0 refills | Status: DC
  Filled 2023-05-28: qty 20, 10d supply, fill #0

## 2023-05-28 MED ORDER — AMLODIPINE BESYLATE 10 MG PO TABS: 10.0000 mg | ORAL_TABLET | Freq: Every day | ORAL | Status: AC

## 2023-05-28 MED ORDER — SEVELAMER CARBONATE 800 MG PO TABS: 800.0000 mg | ORAL_TABLET | Freq: Three times a day (TID) | ORAL | Status: DC

## 2023-05-28 NOTE — Progress Notes (Signed)
PROGRESS NOTE    Ray Bradley  ZOX:[id number] DOB: 02-19-1971 DOA: 05/23/2023 PCP: Tally Joe, MD   Chief Complaint  Patient presents with   Chest Pain    Brief Narrative:   Ray Bradley is a 52 y.o. male who has a PMH as below including ESRD on HD at home four times per week. He presented to [facility] ED 9/2 with chest pain after having difficulties with dialysate on machine.  Was able to get it working but then developed chills so he presented to ER.  N/V with 1 episode of diarrhea, febrile and hypotensive with elevated lactate but improved after fluid bolus.  Denied any recent sick contact/ exposures.  CT abd/pelv that showed right L4-L5 facet joint arthritis, multiple pulmonary nodules with surrounding GGO, thick walled GB. CT L spine was negative for acute processes.  P patient was admitted to ICU due to progressive hypotension, with watery diarrhea, no nausea, no vomiting, tolerating p.o. admitted to ICU due to pressor support requirement, workup significant for bacteremia due to Serratia, he is weaned off pressors, transferred to progressive unit 05/27/2023.    Assessment & Plan:   Principal Problem:   Septic shock (HCC) Active Problems:   ESRD (end stage renal disease) (HCC)   Hyperlipidemia   Essential hypertension   Leukopenia   Paroxysmal atrial fibrillation (HCC)   Sepsis (HCC)   Shock, present on admission, secondary to Serratia bacteremia and possible aspiration pneumonia -Possible component of hypovolemia as well, this has resolved -Required pressors initially, but he remains off pressor since 9/4 -ID consult greatly appreciated, patient would prefer to go home with p.o. antibiotic instead of receiving IV antibiotics during HD sessions, awaiting final sensitivities and susceptibilities  Possible aspiration pneumonitis/ PNA w/ bilateral pulmonary nodules concerning for infectious vs inflammatory changes Hypoxia, resolved - off O2, continue supplemental O2 prn,  sat >  92%.   - abx as above will cover for aspiration - if urine obtained, send for urine strep - OOB today, pulm hygiene/ IS - pulmonary hygiene, IS, mobilize as able - will need f/u imaging to ensure resolution of nodules in 4-6 weeks    Mild transaminitis, improving with ongoing hyperbilirubinemia  Thrombocytopenia- suspected related to sepsis  - trend CBC/ monitor, stable - continue supportive care as above, abx as above   ESRD on home HD AGMA, improved - on home HD for 4 years.  Dry weight 116kg.  Last full treatment at home on 8/31.  iHD 9/4 -Renal is following, for HD today     Hypomagnesemia,  -Repleted     Chest pain - resolved. Seems musculoskeletal  Left shoulder pain- chronic/ prior injury - Initial EKG A.flutter but currently sinus tach on exam. Trop negative.  ?PAF? But not on any AC but do not see this on EMR - no RWA on TTE - lidocaine patch prn    HTN - normal SBP 120-150 - cont to hold amlodipine, coreg, and losartan as BP remains 100-120 - cont ASA     DMT2 - SSI d/c given borderline glucose but poor PO intake. - A1c in am, previous A1c 7 in February  - hold ozempic      Chronic microcytic anemia of chronic disease - Hgb stable, no evidence of bleeding - trend CBC     DVT prophylaxis: SCDs, he is ambulatory Code Status: Full Family Communication: none at bedside Disposition:   Status is: Inpatient    Consultants:  PCCM ID Renal   Subjective:  Denies any  complaints, no significant events overnight  Objective: Vitals:   05/28/23 0000 05/28/23 0523 05/28/23 0815 05/28/23 1120  BP: 112/64 (!) 140/81 139/67 (!) 157/78  Pulse: 79 81 83   Resp: 15 20 (!) 21   Temp: 99.5 F (37.5 C) 98.8 F (37.1 C)    TempSrc: Oral Oral    SpO2: 97% 97%    Weight:      Height:        Intake/Output Summary (Last 24 hours) at 05/28/2023 1335 Last data filed at 05/28/2023 0100 Gross per 24 hour  Intake 120 ml  Output 2.8 ml  Net 117.2 ml   Filed  Weights   05/27/23 0500 05/27/23 1406 05/27/23 1824  Weight: 124.1 kg 120.8 kg 118.5 kg    Examination:  Awake Alert, Oriented X 3, No new F.N deficits, Normal affect Symmetrical Chest wall movement, Good air movement bilaterally, CTAB RRR,No Gallops,Rubs or new Murmurs, No Parasternal Heave +ve B.Sounds, Abd Soft, No tenderness, No rebound - guarding or rigidity. No Cyanosis, Clubbing or edema, No new Rash or bruise       Data Reviewed: I have personally reviewed following labs and imaging studies  CBC: Recent Labs  Lab 05/23/23 2155 05/24/23 0218 05/25/23 0231 05/26/23 0614 05/27/23 0341 05/28/23 0825  WBC 1.6* 3.4* 24.1* 26.4* 24.2* 15.7*  NEUTROABS 1.3*  --   --   --   --   --   HGB 9.6* 9.4* 9.4* 9.1* 8.7* 9.8*  HCT 29.4* 29.0* 27.8* 26.7* 25.6* 29.4*  MCV 84.2 85.5 82.2 82.7 80.5 81.9  PLT 182 171 126* 100* 100* 103*    Basic Metabolic Panel: Recent Labs  Lab 05/25/23 0231 05/25/23 1600 05/25/23 2355 05/26/23 0614 05/27/23 0012 05/27/23 0341 05/27/23 1147 05/28/23 0825  NA 134* 134*  --  135  --  134*  --  134*  K 4.0 4.0  --  3.5  --  3.0*  --  3.5  CL 94* 97*  --  95*  --  98  --  96*  CO2 17* 21*  --  21*  --  21*  --  26  GLUCOSE 102* 79  --  87  --  129*  --  122*  BUN 87* 49*  --  65*  --  84*  --  60*  CREATININE 14.29* 8.02*  --  10.10*  --  11.79*  --  9.33*  CALCIUM 7.6* 7.8*  --  8.6*  --  8.7*  --  8.6*  MG 1.5* 1.5* 2.5* 2.6* 2.8*  --  3.0*  --   PHOS  --   --   --  5.3*  --   --   --   --     GFR: Estimated Creatinine Clearance: 12.2 mL/min (A) (by C-G formula based on SCr of 9.33 mg/dL (H)).  Liver Function Tests: Recent Labs  Lab 05/24/23 0218 05/24/23 1145 05/25/23 0231 05/26/23 0614 05/27/23 0341  AST 158* 121* 104* 53* 39  ALT 147* 152* 160* 98* 73*  ALKPHOS 124 121 111 129* 153*  BILITOT 1.8* 2.6* 3.4* 4.1* 4.7*  PROT 6.2* 6.5 6.5 6.0* 6.1*  ALBUMIN 2.9* 2.9* 2.9* 2.4* 2.4*    CBG: Recent Labs  Lab 05/25/23 2324  05/26/23 0342 05/26/23 0746 05/27/23 0834 05/27/23 1157  GLUCAP 94 83 78 118* 127*     Recent Results (from the past 240 hour(s))  Resp panel by RT-PCR (RSV, Flu A&B, Covid) Anterior Nasal Swab  Status: None   Collection Time: 05/23/23  8:52 PM   Specimen: Anterior Nasal Swab  Result Value Ref Range Status   SARS Coronavirus 2 by RT PCR NEGATIVE NEGATIVE Final   Influenza A by PCR NEGATIVE NEGATIVE Final   Influenza B by PCR NEGATIVE NEGATIVE Final    Comment: (NOTE) The Xpert Xpress SARS-CoV-2/FLU/RSV plus assay is intended as an aid in the diagnosis of influenza from Nasopharyngeal swab specimens and should not be used as a sole basis for treatment. Nasal washings and aspirates are unacceptable for Xpert Xpress SARS-CoV-2/FLU/RSV testing.  Fact Sheet for Patients: BloggerCourse.com  Fact Sheet for Healthcare Providers: SeriousBroker.it  This test is not yet approved or cleared by the [place] FDA and has been authorized for detection and/or diagnosis of SARS-CoV-2 by FDA under an Emergency Use Authorization (EUA). This EUA will remain in effect (meaning this test can be used) for the duration of the COVID-19 declaration under Section 564(b)(1) of the Act, 21 U.S.C. section 360bbb-3(b)(1), unless the authorization is terminated or revoked.     Resp Syncytial Virus by PCR NEGATIVE NEGATIVE Final    Comment: (NOTE) Fact Sheet for Patients: BloggerCourse.com  Fact Sheet for Healthcare Providers: SeriousBroker.it  This test is not yet approved or cleared by the [place] FDA and has been authorized for detection and/or diagnosis of SARS-CoV-2 by FDA under an Emergency Use Authorization (EUA). This EUA will remain in effect (meaning this test can be used) for the duration of the COVID-19 declaration under Section 564(b)(1) of the Act, 21 U.S.C. section  360bbb-3(b)(1), unless the authorization is terminated or revoked.  Performed at [facility], [address] [place], [place] [address]   Blood Culture (routine x 2)     Status: Abnormal (Preliminary result)   Collection Time: 05/23/23  8:52 PM   Specimen: BLOOD  Result Value Ref Range Status   Specimen Description BLOOD SITE NOT SPECIFIED  Final   Special Requests   Final    BOTTLES DRAWN AEROBIC AND ANAEROBIC Blood Culture results may not be optimal due to an inadequate volume of blood received in culture bottles   Culture  Setup Time   Final    GRAM NEGATIVE RODS GRAM POSITIVE RODS BOTTLES DRAWN AEROBIC ONLY CRITICAL RESULT CALLED TO, READ BACK BY AND VERIFIED WITH: BRYK,PHARMD@2349  05/24/23 MK    Culture (A)  Final    SERRATIA MARCESCENS CULTURE REINCUBATED FOR BETTER GROWTH Performed at [facility], [address], [place], [place] [address]    Report Status PENDING  Incomplete   Organism ID, Bacteria SERRATIA MARCESCENS  Final      Susceptibility   Serratia marcescens - MIC*    CEFEPIME <=0.12 SENSITIVE Sensitive     CEFTAZIDIME <=1 SENSITIVE Sensitive     CEFTRIAXONE <=0.25 SENSITIVE Sensitive     CIPROFLOXACIN <=0.25 SENSITIVE Sensitive     GENTAMICIN <=1 SENSITIVE Sensitive     TRIMETH/SULFA <=20 SENSITIVE Sensitive     * SERRATIA MARCESCENS  Blood Culture ID Panel (Reflexed)     Status: Abnormal   Collection Time: 05/23/23  8:52 PM  Result Value Ref Range Status   Enterococcus faecalis NOT DETECTED NOT DETECTED Final   Enterococcus Faecium NOT DETECTED NOT DETECTED Final   Listeria monocytogenes NOT DETECTED NOT DETECTED Final   Staphylococcus species NOT DETECTED NOT DETECTED Final   Staphylococcus aureus (BCID) NOT DETECTED NOT DETECTED Final   Staphylococcus epidermidis NOT DETECTED NOT DETECTED Final   Staphylococcus lugdunensis  NOT DETECTED NOT DETECTED Final   Streptococcus species NOT DETECTED NOT DETECTED Final   Streptococcus agalactiae  NOT DETECTED NOT DETECTED Final   Streptococcus pneumoniae NOT DETECTED NOT DETECTED Final   Streptococcus pyogenes NOT DETECTED NOT DETECTED Final   A.calcoaceticus-baumannii NOT DETECTED NOT DETECTED Final   Bacteroides fragilis NOT DETECTED NOT DETECTED Final   Enterobacterales DETECTED (A) NOT DETECTED Final    Comment: Enterobacterales represent a large order of gram negative bacteria, not a single organism. CRITICAL RESULT CALLED TO, READ BACK BY AND VERIFIED WITH: V BRYK,PHARMD@2349  05/24/23 MK    Enterobacter cloacae complex NOT DETECTED NOT DETECTED Final   Escherichia coli NOT DETECTED NOT DETECTED Final   Klebsiella aerogenes NOT DETECTED NOT DETECTED Final   Klebsiella oxytoca NOT DETECTED NOT DETECTED Final   Klebsiella pneumoniae NOT DETECTED NOT DETECTED Final   Proteus species NOT DETECTED NOT DETECTED Final   Salmonella species NOT DETECTED NOT DETECTED Final   Serratia marcescens DETECTED (A) NOT DETECTED Final    Comment: CRITICAL RESULT CALLED TO, READ BACK BY AND VERIFIED WITH: V BRYK,PHARMD@2349  05/24/23 MK    Haemophilus influenzae NOT DETECTED NOT DETECTED Final   Neisseria meningitidis NOT DETECTED NOT DETECTED Final   Pseudomonas aeruginosa NOT DETECTED NOT DETECTED Final   Stenotrophomonas maltophilia NOT DETECTED NOT DETECTED Final   Candida albicans NOT DETECTED NOT DETECTED Final   Candida auris NOT DETECTED NOT DETECTED Final   Candida glabrata NOT DETECTED NOT DETECTED Final   Candida krusei NOT DETECTED NOT DETECTED Final   Candida parapsilosis NOT DETECTED NOT DETECTED Final   Candida tropicalis NOT DETECTED NOT DETECTED Final   Cryptococcus neoformans/gattii NOT DETECTED NOT DETECTED Final   CTX-M ESBL NOT DETECTED NOT DETECTED Final   Carbapenem resistance IMP NOT DETECTED NOT DETECTED Final   Carbapenem resistance KPC NOT DETECTED NOT DETECTED Final   Carbapenem resistance NDM NOT DETECTED NOT DETECTED Final   Carbapenem resist OXA 48 LIKE NOT  DETECTED NOT DETECTED Final   Carbapenem resistance VIM NOT DETECTED NOT DETECTED Final    Comment: Performed at [facility], [address], [place], [place] [address]  Blood Culture (routine x 2)     Status: None (Preliminary result)   Collection Time: 05/23/23  8:55 PM   Specimen: BLOOD  Result Value Ref Range Status   Specimen Description BLOOD SITE NOT SPECIFIED  Final   Special Requests   Final    BOTTLES DRAWN AEROBIC AND ANAEROBIC Blood Culture results may not be optimal due to an inadequate volume of blood received in culture bottles   Culture  Setup Time   Final    GRAM POSITIVE RODS ANAEROBIC BOTTLE ONLY CRITICAL RESULT CALLED TO, READ BACK BY AND VERIFIED WITH: PHARMD J. MILLEN [id number] @ 1719 FH     Culture   Final    CULTURE REINCUBATED FOR BETTER GROWTH Performed at [facility], [address] [place], [place] [address]    Report Status PENDING  Incomplete  Gastrointestinal Panel by PCR , Stool     Status: None   Collection Time: 05/24/23  6:13 PM   Specimen: Stool  Result Value Ref Range Status   Campylobacter species NOT DETECTED NOT DETECTED Final   Plesimonas shigelloides NOT DETECTED NOT DETECTED Final   Salmonella species NOT DETECTED NOT DETECTED Final   Yersinia enterocolitica NOT DETECTED NOT DETECTED Final   Vibrio species NOT DETECTED NOT DETECTED Final   Vibrio cholerae NOT DETECTED NOT DETECTED  Final   Enteroaggregative E coli (EAEC) NOT DETECTED NOT DETECTED Final   Enteropathogenic E coli (EPEC) NOT DETECTED NOT DETECTED Final   Enterotoxigenic E coli (ETEC) NOT DETECTED NOT DETECTED Final   Shiga like toxin producing E coli (STEC) NOT DETECTED NOT DETECTED Final   Shigella/Enteroinvasive E coli (EIEC) NOT DETECTED NOT DETECTED Final   Cryptosporidium NOT DETECTED NOT DETECTED Final   Cyclospora cayetanensis NOT DETECTED NOT DETECTED Final   Entamoeba histolytica NOT DETECTED NOT DETECTED Final   Giardia lamblia NOT DETECTED NOT  DETECTED Final   Adenovirus F40/41 NOT DETECTED NOT DETECTED Final   Astrovirus NOT DETECTED NOT DETECTED Final   Norovirus GI/GII NOT DETECTED NOT DETECTED Final   Rotavirus A NOT DETECTED NOT DETECTED Final   Sapovirus (I, II, IV, and V) NOT DETECTED NOT DETECTED Final    Comment: Performed at [facility], [address] [place], [place] [address]  OVA + PARASITE EXAM     Status: None   Collection Time: 05/24/23  6:13 PM   Specimen: Stool  Result Value Ref Range Status   OVA + PARASITE EXAM Final report  Final    Comment: (NOTE) These results were obtained using wet preparation(s) and trichrome stained smear. This test does not include testing for Cryptosporidium parvum, Cyclospora, or Microsporidia. Performed At: [facility] [address] [place], [place] [id number] Jolene Schimke MD UU:[id number]    Source of Sample STOOL  Final    Comment: Performed at [facility], [address], [place], [place] [address]  C Difficile Quick Screen w PCR reflex     Status: None   Collection Time: 05/24/23  6:14 PM   Specimen: STOOL  Result Value Ref Range Status   C Diff antigen NEGATIVE NEGATIVE Final   C Diff toxin NEGATIVE NEGATIVE Final   C Diff interpretation No C. difficile detected.  Final    Comment: Performed at [facility], [address], [place], [place] [address]  MRSA Next Gen by PCR, Nasal     Status: None   Collection Time: 05/24/23  8:19 PM   Specimen: Nasal Mucosa; Nasal Swab  Result Value Ref Range Status   MRSA by PCR Next Gen NOT DETECTED NOT DETECTED Final    Comment: (NOTE) The GeneXpert MRSA Assay (FDA approved for NASAL specimens only), is one component of a comprehensive MRSA colonization surveillance program. It is not intended to diagnose MRSA infection nor to guide or monitor treatment for MRSA infections. Test performance is not FDA approved in patients less than 29 years old. Performed at [facility], [address] [place], [place] [address]          Radiology Studies: No results found.      Scheduled Meds:  aspirin EC  81 mg Oral Daily   Chlorhexidine Gluconate Cloth  6 each Topical Q0600   Chlorhexidine Gluconate Cloth  6 each Topical Q0600   Chlorhexidine Gluconate Cloth  6 each Topical Q0600   ferric citrate  420 mg Oral TID WC   heparin  5,000 Units Subcutaneous Q8H   Continuous Infusions:  sodium chloride 250 mL (05/26/23 1844)   ceFEPime (MAXIPIME) IV 1 g (05/27/23 1941)   metronidazole 500 mg (05/28/23 0522)     LOS: 4 days      Huey Bienenstock, MD [facility]   To contact the attending provider between 7A-7P or the covering provider during after hours 7P-7A, please log into the  web site [email or web address] and access using universal [place] password for that web site. If you do not have the password, please call the hospital operator.  05/28/2023, 1:35 PM

## 2023-05-28 NOTE — Progress Notes (Signed)
Pharmacy Antibiotic Note  Ray Bradley is a 52 y.o. male admitted on 05/23/2023 with sepsis and septic pulmonary emboli.  Pharmacy has been consulted to change from cefepime to cipro + metronidazole - with end date 9/17 per ID for serratia bacteremia Pt ESRD   WBC 24>15 fever curve trending down to 99  Plan: Cipro 400mg  IV q24h Metronidazole 500mg  IV q12h  Height: 5\' 10"  (177.8 cm) Weight: 118.5 kg (261 lb 3.9 oz) IBW/kg (Calculated) : 73  Temp (24hrs), Avg:98.9 F (37.2 C), Min:98.4 F (36.9 C), Max:99.5 F (37.5 C)  Recent Labs  Lab 05/24/23 0027 05/24/23 0218 05/24/23 0219 05/24/23 0411 05/24/23 1140 05/24/23 1145 05/25/23 0231 05/25/23 1600 05/26/23 0614 05/27/23 0341 05/28/23 0825  WBC  --  3.4*  --   --   --   --  24.1*  --  26.4* 24.2* 15.7*  CREATININE  --  11.74*  --   --   --    < > 14.29* 8.02* 10.10* 11.79* 9.33*  LATICACIDVEN 6.6*  --  7.6* 5.8* 4.2*  --   --   --   --   --   --    < > = values in this interval not displayed.    Estimated Creatinine Clearance: 12.2 mL/min (A) (by C-G formula based on SCr of 9.33 mg/dL (H)).    Allergies  Allergen Reactions   Pork-Derived Products Other (See Comments)    Religious beliefs    Leota Sauers Pharm.D. CPP, BCPS Clinical Pharmacist [phone] 05/28/2023 2:47 PM

## 2023-05-28 NOTE — Plan of Care (Signed)

## 2023-05-28 NOTE — Progress Notes (Addendum)
   [facility] Progress Note   Subjective:   Patient seen in room. In good spirits. No c/o.   Objective Vitals:   05/28/23 0000 05/28/23 0523 05/28/23 0815 05/28/23 1120  BP: 112/64 (!) 140/81 139/67 (!) 157/78  Pulse: 79 81 83   Resp: 15 20 (!) 21   Temp: 99.5 F (37.5 C) 98.8 F (37.1 C)    TempSrc: Oral Oral    SpO2: 97% 97%    Weight:      Height:       Physical Exam General: Well-appearing, well-nourished adult male lying on hospital bed in no acute distress.  Heart: RRR no m/r/g Lungs: CTABL Abdomen: Soft, non-tender, non-distended.   Extremities: No edema in bilateral upper and lower extremities. Dialysis Access: [facility] - in good repair w/ + bruit/thrill, no edema/erythema/warmth Medications:  sodium chloride 250 mL (05/26/23 1844)   ciprofloxacin     metronidazole 500 mg (05/28/23 0522)    aspirin EC  81 mg Oral Daily   Chlorhexidine Gluconate Cloth  6 each Topical Q0600   Chlorhexidine Gluconate Cloth  6 each Topical Q0600   Chlorhexidine Gluconate Cloth  6 each Topical Q0600   ferric citrate  420 mg Oral TID WC   heparin  5,000 Units Subcutaneous Q8H    Dialysis Orders: Home HD 4x per week  3.5h   116kg  BFR 400   Heparin 4000 (1 or 2 doses)   L AVF  Assessment/Plan: ESRD: pt had HD 2x while here this past Wed and Friday. He is close to his dry wt of 116kg. OK for dc from renal standpoint. He will resume his home HD schedule at home.  Serratia Bacteremia: resolving. BP is good off pressors x72 h. Being discharged on po abx cipro/ flagyl through 06/08/23.  Anemia of CKD: Hgb stable.  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 Continue phos binders.  Possible iron overload - high ferritin here around 2,144, along w/ MRCP abdomen in Feb 2024 showing iron deposition in the liver, spleen and bone marrow, suggesting iron overload. Primary md questioning if he has iron overload and should we change binder (on [place] now which is made w/ Fe). Will  change binder to renvela for now. Will pass along info to OP HD renal MD as well.  HTN/ volume - 2kg over dry wt, no gross overload. BP's are borderline high. Okay to resume some or all of his BP meds at dc today.  Dispo - going home, as above  Ray Moselle  MD  [facility] 05/28/2023, 2:52 PM  Recent Labs  Lab 05/26/23 0614 05/27/23 0341 05/28/23 0825  HGB 9.1* 8.7* 9.8*  ALBUMIN 2.4* 2.4*  --   CALCIUM 8.6* 8.7* 8.6*  PHOS 5.3*  --   --   CREATININE 10.10* 11.79* 9.33*  K 3.5 3.0* 3.5    Inpatient medications:  aspirin EC  81 mg Oral Daily   Chlorhexidine Gluconate Cloth  6 each Topical Q0600   Chlorhexidine Gluconate Cloth  6 each Topical Q0600   Chlorhexidine Gluconate Cloth  6 each Topical Q0600   ferric citrate  420 mg Oral TID WC   heparin  5,000 Units Subcutaneous Q8H    sodium chloride 250 mL (05/26/23 1844)   ciprofloxacin     metronidazole 500 mg (05/28/23 0522)   acetaminophen **OR** acetaminophen, lidocaine, loperamide, ondansetron **OR** ondansetron (ZOFRAN) IV, mouth rinse, Pedialyte, senna-docusate     '

## 2023-05-28 NOTE — Progress Notes (Addendum)
ID Brief note   Results for orders placed or performed during the hospital encounter of 05/23/23  Resp panel by RT-PCR (RSV, Flu A&B, Covid) Anterior Nasal Swab     Status: None   Collection Time: 05/23/23  8:52 PM   Specimen: Anterior Nasal Swab  Result Value Ref Range Status   SARS Coronavirus 2 by RT PCR NEGATIVE NEGATIVE Final   Influenza A by PCR NEGATIVE NEGATIVE Final   Influenza B by PCR NEGATIVE NEGATIVE Final    Comment: (NOTE) The Xpert Xpress SARS-CoV-2/FLU/RSV plus assay is intended as an aid in the diagnosis of influenza from Nasopharyngeal swab specimens and should not be used as a sole basis for treatment. Nasal washings and aspirates are unacceptable for Xpert Xpress SARS-CoV-2/FLU/RSV testing.  Fact Sheet for Patients: BloggerCourse.com  Fact Sheet for Healthcare Providers: SeriousBroker.it  This test is not yet approved or cleared by the [place] FDA and has been authorized for detection and/or diagnosis of SARS-CoV-2 by FDA under an Emergency Use Authorization (EUA). This EUA will remain in effect (meaning this test can be used) for the duration of the COVID-19 declaration under Section 564(b)(1) of the Act, 21 U.S.C. section 360bbb-3(b)(1), unless the authorization is terminated or revoked.     Resp Syncytial Virus by PCR NEGATIVE NEGATIVE Final    Comment: (NOTE) Fact Sheet for Patients: BloggerCourse.com  Fact Sheet for Healthcare Providers: SeriousBroker.it  This test is not yet approved or cleared by the [place] FDA and has been authorized for detection and/or diagnosis of SARS-CoV-2 by FDA under an Emergency Use Authorization (EUA). This EUA will remain in effect (meaning this test can be used) for the duration of the COVID-19 declaration under Section 564(b)(1) of the Act, 21 U.S.C. section 360bbb-3(b)(1), unless the authorization is  terminated or revoked.  Performed at [facility], [address] [place], [place] [address]   Blood Culture (routine x 2)     Status: Abnormal (Preliminary result)   Collection Time: 05/23/23  8:52 PM   Specimen: BLOOD  Result Value Ref Range Status   Specimen Description BLOOD SITE NOT SPECIFIED  Final   Special Requests   Final    BOTTLES DRAWN AEROBIC AND ANAEROBIC Blood Culture results may not be optimal due to an inadequate volume of blood received in culture bottles   Culture  Setup Time   Final    GRAM NEGATIVE RODS GRAM POSITIVE RODS BOTTLES DRAWN AEROBIC ONLY CRITICAL RESULT CALLED TO, READ BACK BY AND VERIFIED WITH: BRYK,PHARMD@2349  05/24/23 MK    Culture (A)  Final    SERRATIA MARCESCENS CULTURE REINCUBATED FOR BETTER GROWTH Performed at [facility], [address], [place], [place] [address]    Report Status PENDING  Incomplete   Organism ID, Bacteria SERRATIA MARCESCENS  Final      Susceptibility   Serratia marcescens - MIC*    CEFEPIME <=0.12 SENSITIVE Sensitive     CEFTAZIDIME <=1 SENSITIVE Sensitive     CEFTRIAXONE <=0.25 SENSITIVE Sensitive     CIPROFLOXACIN <=0.25 SENSITIVE Sensitive     GENTAMICIN <=1 SENSITIVE Sensitive     TRIMETH/SULFA <=20 SENSITIVE Sensitive     * SERRATIA MARCESCENS  Blood Culture ID Panel (Reflexed)     Status: Abnormal   Collection Time: 05/23/23  8:52 PM  Result Value Ref Range Status   Enterococcus faecalis NOT DETECTED NOT DETECTED Final   Enterococcus Faecium NOT DETECTED NOT DETECTED Final   Listeria monocytogenes NOT DETECTED NOT DETECTED  Final   Staphylococcus species NOT DETECTED NOT DETECTED Final   Staphylococcus aureus (BCID) NOT DETECTED NOT DETECTED Final   Staphylococcus epidermidis NOT DETECTED NOT DETECTED Final   Staphylococcus lugdunensis NOT DETECTED NOT DETECTED Final   Streptococcus species NOT DETECTED NOT DETECTED Final   Streptococcus agalactiae NOT DETECTED NOT DETECTED Final    Streptococcus pneumoniae NOT DETECTED NOT DETECTED Final   Streptococcus pyogenes NOT DETECTED NOT DETECTED Final   A.calcoaceticus-baumannii NOT DETECTED NOT DETECTED Final   Bacteroides fragilis NOT DETECTED NOT DETECTED Final   Enterobacterales DETECTED (A) NOT DETECTED Final    Comment: Enterobacterales represent a large order of gram negative bacteria, not a single organism. CRITICAL RESULT CALLED TO, READ BACK BY AND VERIFIED WITH: V BRYK,PHARMD@2349  05/24/23 MK    Enterobacter cloacae complex NOT DETECTED NOT DETECTED Final   Escherichia coli NOT DETECTED NOT DETECTED Final   Klebsiella aerogenes NOT DETECTED NOT DETECTED Final   Klebsiella oxytoca NOT DETECTED NOT DETECTED Final   Klebsiella pneumoniae NOT DETECTED NOT DETECTED Final   Proteus species NOT DETECTED NOT DETECTED Final   Salmonella species NOT DETECTED NOT DETECTED Final   Serratia marcescens DETECTED (A) NOT DETECTED Final    Comment: CRITICAL RESULT CALLED TO, READ BACK BY AND VERIFIED WITH: V BRYK,PHARMD@2349  05/24/23 MK    Haemophilus influenzae NOT DETECTED NOT DETECTED Final   Neisseria meningitidis NOT DETECTED NOT DETECTED Final   Pseudomonas aeruginosa NOT DETECTED NOT DETECTED Final   Stenotrophomonas maltophilia NOT DETECTED NOT DETECTED Final   Candida albicans NOT DETECTED NOT DETECTED Final   Candida auris NOT DETECTED NOT DETECTED Final   Candida glabrata NOT DETECTED NOT DETECTED Final   Candida krusei NOT DETECTED NOT DETECTED Final   Candida parapsilosis NOT DETECTED NOT DETECTED Final   Candida tropicalis NOT DETECTED NOT DETECTED Final   Cryptococcus neoformans/gattii NOT DETECTED NOT DETECTED Final   CTX-M ESBL NOT DETECTED NOT DETECTED Final   Carbapenem resistance IMP NOT DETECTED NOT DETECTED Final   Carbapenem resistance KPC NOT DETECTED NOT DETECTED Final   Carbapenem resistance NDM NOT DETECTED NOT DETECTED Final   Carbapenem resist OXA 48 LIKE NOT DETECTED NOT DETECTED Final    Carbapenem resistance VIM NOT DETECTED NOT DETECTED Final    Comment: Performed at [facility], [address], [place], [place] [address]  Blood Culture (routine x 2)     Status: None (Preliminary result)   Collection Time: 05/23/23  8:55 PM   Specimen: BLOOD  Result Value Ref Range Status   Specimen Description BLOOD SITE NOT SPECIFIED  Final   Special Requests   Final    BOTTLES DRAWN AEROBIC AND ANAEROBIC Blood Culture results may not be optimal due to an inadequate volume of blood received in culture bottles   Culture  Setup Time   Final    GRAM POSITIVE RODS ANAEROBIC BOTTLE ONLY CRITICAL RESULT CALLED TO, READ BACK BY AND VERIFIED WITH: PHARMD J. MILLEN [id number] @ 1719 FH     Culture   Final    CULTURE REINCUBATED FOR BETTER GROWTH Performed at [facility], [address] [place], [place] [address]    Report Status PENDING  Incomplete  Gastrointestinal Panel by PCR , Stool     Status: None   Collection Time: 05/24/23  6:13 PM   Specimen: Stool  Result Value Ref Range Status   Campylobacter species NOT DETECTED NOT DETECTED Final   Plesimonas shigelloides NOT DETECTED NOT DETECTED Final  Salmonella species NOT DETECTED NOT DETECTED Final   Yersinia enterocolitica NOT DETECTED NOT DETECTED Final   Vibrio species NOT DETECTED NOT DETECTED Final   Vibrio cholerae NOT DETECTED NOT DETECTED Final   Enteroaggregative E coli (EAEC) NOT DETECTED NOT DETECTED Final   Enteropathogenic E coli (EPEC) NOT DETECTED NOT DETECTED Final   Enterotoxigenic E coli (ETEC) NOT DETECTED NOT DETECTED Final   Shiga like toxin producing E coli (STEC) NOT DETECTED NOT DETECTED Final   Shigella/Enteroinvasive E coli (EIEC) NOT DETECTED NOT DETECTED Final   Cryptosporidium NOT DETECTED NOT DETECTED Final   Cyclospora cayetanensis NOT DETECTED NOT DETECTED Final   Entamoeba histolytica NOT DETECTED NOT DETECTED Final   Giardia lamblia NOT DETECTED NOT DETECTED Final   Adenovirus F40/41  NOT DETECTED NOT DETECTED Final   Astrovirus NOT DETECTED NOT DETECTED Final   Norovirus GI/GII NOT DETECTED NOT DETECTED Final   Rotavirus A NOT DETECTED NOT DETECTED Final   Sapovirus (I, II, IV, and V) NOT DETECTED NOT DETECTED Final    Comment: Performed at [facility], [address] [place], [place] [address]  OVA + PARASITE EXAM     Status: None   Collection Time: 05/24/23  6:13 PM   Specimen: Stool  Result Value Ref Range Status   OVA + PARASITE EXAM Final report  Final    Comment: (NOTE) These results were obtained using wet preparation(s) and trichrome stained smear. This test does not include testing for Cryptosporidium parvum, Cyclospora, or Microsporidia. Performed At: [facility] [address] [place], [place] [id number] Jolene Schimke MD XB:[id number]    Source of Sample STOOL  Final    Comment: Performed at [facility], [address] [place], [place] [address]  C Difficile Quick Screen w PCR reflex     Status: None   Collection Time: 05/24/23  6:14 PM   Specimen: STOOL  Result Value Ref Range Status   C Diff antigen NEGATIVE NEGATIVE Final   C Diff toxin NEGATIVE NEGATIVE Final   C Diff interpretation No C. difficile detected.  Final    Comment: Performed at [facility], [address] [place], [place] [address]  MRSA Next Gen by PCR, Nasal     Status: None   Collection Time: 05/24/23  8:19 PM   Specimen: Nasal Mucosa; Nasal Swab  Result Value Ref Range Status   MRSA by PCR Next Gen NOT DETECTED NOT DETECTED Final    Comment: (NOTE) The GeneXpert MRSA Assay (FDA approved for NASAL specimens only), is one component of a comprehensive MRSA colonization surveillance program. It is not intended to diagnose MRSA infection nor to guide or monitor treatment for MRSA infections. Test performance is not FDA approved in patients less than 24 years old. Performed at [facility], [address] [place], [place] [address]     Per micro, GPR is an anaerobic GPR and possibly a contaminant. They are sending to ID to [facility] tomorrow Afebrile. Leukocytosis is downtrending  DC cefepime, start ciprofloxacin, ESRD dosing for GNR bacteremia. Continue metronidazole  500mg  po bid. Pharmacy consult placed.  Complete 2 weeks course due to unclear source. EOT 06/08/23 ID will so, recall if needed   Odette Fraction, MD Infectious Disease Physician [facility] [address] [place], [place] [address] Phone: [phone]  Fax: [phone]

## 2023-05-28 NOTE — Discharge Summary (Signed)
Physician Discharge Summary  Ray Bradley XLK:[id number] DOB: [birth year] DOA: 05/23/2023  PCP: Tally Joe, MD  Admit date: 05/23/2023 Discharge date: 05/28/2023  Admitted From: (Home) Disposition:  (Home )  Recommendations for Outpatient Follow-up:  Follow up with PCP in  weeks Please obtain BMP/CBC/LFTs in one week Please will need repeat CT chest in 3 months to ensure resolution of pulmonary nodule Patient will need follow-up with GI, secondary to transaminitis, AST, ALT trending down, but alk phos/total bili remains elevated, as well due to finding of possible cirrhosis on the ultrasound. Patient will need outpatient follow-up with hematology, ambulatory referral has been sent to hematology, given elevated ferritin level, low transferrin, and evidence of iron deposition on liver, spleen and bone marrow.   Diet recommendation: Renal diet with 1200 cc fluid restrictions  Brief/Interim Summary:  Ray Bradley is a 52 y.o. male who has a PMH as below including ESRD on HD at home four times per week. He presented to [facility] ED 9/2 with chest pain after having difficulties with dialysate on machine.  Was able to get it working but then developed chills so he presented to ER.  N/V with 1 episode of diarrhea, febrile and hypotensive with elevated lactate but improved after fluid bolus.  Denied any recent sick contact/ exposures.  CT abd/pelv that showed right L4-L5 facet joint arthritis, multiple pulmonary nodules with surrounding GGO, thick walled GB. CT L spine was negative for acute processes.  P patient was admitted to ICU due to progressive hypotension, with watery diarrhea, no nausea, no vomiting, tolerating p.o. admitted to ICU due to pressor support requirement, workup significant for bacteremia due to Serratia, he is weaned off pressors, transferred to progressive unit 05/27/2023.  Blood pressure remained stable.   Septic shock, present on admission, secondary to Serratia bacteremia and  another organism (Per micro, GPR is an anaerobic GPR and possibly a contaminant. ) -As well possible component of hypovolemia as well, this has resolved -Required pressors initially, but he remains off pressor since 9/4, blood pressure has been stable, actually trending up. -ID consult greatly appreciated, patient would prefer to go home with p.o. antibiotic instead of receiving IV antibiotics during HD sessions, culture growing Serratia, another organism)Per micro, GPR is an anaerobic GPR and possibly a contaminant. They are sending to ID to [facility] tomorrow ) -As I have discussed with ID, patient improving, afebrile, leukocytosis is trending down, so he can be discharged on oral regimen, he will DC on Flagyl, Cipro, he was treated with IV cefepime cefepime and Flagyl during hospital stay close (   Possible aspiration pneumonitis/ PNA w/ bilateral pulmonary nodules concerning for infectious vs inflammatory changes Hypoxia, resolved -He is on room air, encouraged to ambulate and use incentive spirometer -He will need repeat CT chest in 3 months to ensure resolution of pulmonary nodules I have discussed with patient, wife at bedside importance to follow-up on repeat imaging     Mild transaminitis, improving with ongoing hyperbilirubinemia  Thrombocytopenia - suspected related to sepsis  -AST and ALT trending down, alk phos and total bilirubin trending up, right upper quadrant ultrasound with common bile duct diameter of 4 mm, Right upper quadrant ultrasound with evidence of subtle nodularity questioning cirrhosis, but CT abdomen pelvis x 2 this year, and MRI abdomen with no evidence of cirrhosis and liver, but evidence of iron iron deposition in the liver, spleen and bone marrow . -Patient will need to follow-up with GI as an outpatient regarding elevated LFTs, reports he follows  with Dr.  Dulce Sellar, he will call tomorrow to schedule an appointment -There was a concern of IV iron deposition on liver,  spleen and bone marrow, cso ferritin has been obtained, significantly elevated at 2144.  ESRD on home HD AGMA, improved - on home HD for 4 years.  Dry weight 116kg.  Last full treatment at home on 8/31.  iHD 9/4, and 9/6 -Renal is following, -Ferric citrate  changed to Renvela given elevated IV ferritin level    Hypomagnesemia,  -Repleted   Elevated ferritin level -As well evidence of iron disposition on liver, spleen and bone marrow, hematology referral has been made.   Chest pain - resolved. Seems musculoskeletal  Left shoulder pain- chronic/ prior injury - Initial EKG A.flutter but currently sinus tach on exam. Trop negative.  ?PAF? But not on any AC but do not see this on EMR - no RWA on TTE - lidocaine patch prn    HTN -Requiring pressors, but currently improved, on the higher side, will resume home medication on a gradual fashion, start with Coreg, resume amlodipine early next week, losartan late next week.   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     Chronic microcytic anemia of chronic disease - Hgb stable, no evidence of bleeding     Discharge Diagnoses:  Principal Problem:   Septic shock (HCC) Active Problems:   ESRD (end stage renal disease) (HCC)   Hyperlipidemia   Essential hypertension   Leukopenia   Paroxysmal atrial fibrillation (HCC)   Sepsis [facility])    Discharge Instructions  Discharge Instructions     Ambulatory referral to Hematology / Oncology   Complete by: As directed    Patient with significantly elevated ferritin level, evidence of iron deposition in liver, spleen, and bone marrow, evaluate for hemochromatosis   Diet - low sodium heart healthy   Complete by: As directed    Discharge instructions   Complete by: As directed    Follow with Primary MD Tally Joe, MD in 7 days   Get CBC, CMP,  checked  by Primary MD next visit.    Activity: As tolerated with  Full fall precautions use walker/cane & assistance as needed   Disposition Home    Diet: renal diet with 1200 cc fluid restriction   On your next visit with your primary care physician please Get Medicines reviewed and adjusted.   Please request your Prim.MD to go over all Hospital Tests and Procedure/Radiological results at the follow up, please get all Hospital records sent to your Prim MD by signing hospital release before you go home.   If you experience worsening of your admission symptoms, develop shortness of breath, life threatening emergency, suicidal or homicidal thoughts you must seek medical attention immediately by calling 911 or calling your MD immediately  if symptoms less severe.  You Must read complete instructions/literature along with all the possible adverse reactions/side effects for all the Medicines you take and that have been prescribed to you. Take any new Medicines after you have completely understood and accpet all the possible adverse reactions/side effects.   Do not drive, operating heavy machinery, perform activities at heights, swimming or participation in water activities or provide baby sitting services if your were admitted for syncope or siezures until you have seen by Primary MD or a Neurologist and advised to do so again.  Do not drive when taking Pain medications.  Do not take more than prescribed Pain, Sleep and Anxiety Medications  Special Instructions: If you have smoked or chewed Tobacco  in the last 2 yrs please stop smoking, stop any regular Alcohol  and or any Recreational drug use.  Wear Seat belts while driving.   Please note  You were cared for by a hospitalist during your hospital stay. If you have any questions about your discharge medications or the care you received while you were in the hospital after you are discharged, you can call the unit and asked to speak with the hospitalist on call if the hospitalist that took care of you  is not available. Once you are discharged, your primary care physician will handle any further medical issues. Please note that NO REFILLS for any discharge medications will be authorized once you are discharged, as it is imperative that you return to your primary care physician (or establish a relationship with a primary care physician if you do not have one) for your aftercare needs so that they can reassess your need for medications and monitor your lab values.   Increase activity slowly   Complete by: As directed       Allergies as of 05/28/2023       Reactions   Pork-derived Products Other (See Comments)   Religious beliefs        Medication List     STOP taking these medications    ferric citrate 1 GM 210 MG(Fe) tablet Commonly known as: AURYXIA       TAKE these medications    amLODipine 10 MG tablet Commonly known as: NORVASC Take 1 tablet (10 mg total) by mouth daily. Start taking on: May 30, 2023 What changed: These instructions start on May 30, 2023. If you are unsure what to do until then, ask your doctor or other care provider.   aspirin EC 81 MG tablet Take 81 mg by mouth daily.   calcitRIOL 0.5 MCG capsule Commonly known as: ROCALTROL Take 0.5 mcg by mouth daily.   carvedilol 12.5 MG tablet Commonly known as: COREG Take 12.5 mg by mouth 2 (two) times daily.   ciprofloxacin 500 MG tablet Commonly known as: Cipro Take 1 tablet (500 mg total) by mouth daily with breakfast for 11 days.   cyanocobalamin 1000 MCG tablet Commonly known as: VITAMIN B12 Take 1,000 mcg by mouth daily.   Dialyvite 800-Zinc 15 0.8 MG Tabs Take 1 tablet by mouth daily.   heparin sodium (porcine) 1000 UNIT/ML injection Inject 4,000 Units into the vein See admin instructions. 2 days on, 1 day off.   lidocaine 5 % Commonly known as: LIDODERM Place 1 patch onto the skin daily as needed (musculoskeletal pain). Remove & Discard patch within 12 hours or as directed by  MD   losartan 50 MG tablet Commonly known as: COZAAR Take 1 tablet (50 mg total) by mouth daily. Start taking on: June 02, 2023 What changed: These instructions start on June 02, 2023. If you are unsure what to do until then, ask your doctor or other care provider.   metroNIDAZOLE 500 MG tablet Commonly known as: Flagyl Take 1 tablet (500 mg total) by mouth 2 (two) times daily for 11 days.   OZEMPIC (0.25 OR 0.5 MG/DOSE) [place] Inject 0.5 mg into the skin once. Wednesday   sevelamer carbonate 800 MG tablet Commonly known as: Renvela Take 1 tablet (800 mg total) by mouth 3 (three) times daily with meals.  Allergies  Allergen Reactions   Pork-Derived Products Other (See Comments)    Religious beliefs    Consultations: ID Renal   Procedures/Studies: US Abdomen Limited RUQ (LIVER/GB)  Result Date: 05/25/2023 CLINICAL DATA:  Transaminitis EXAM: ULTRASOUND ABDOMEN LIMITED RIGHT UPPER QUADRANT COMPARISON:  CT abdomen pelvis 10/21/2022 FINDINGS: Gallbladder: Status post cholecystectomy Common bile duct: Diameter: 4 mm Liver: Parenchymal echogenicity: Within normal limits Contours: Subtle nodularity Lesions: None Portal vein: Patent.  Hepatopetal flow Other: None. IMPRESSION: Subtle nodularity of hepatic contour suspicious for cirrhosis. No suspicious liver lesions. Electronically Signed   By: Acquanetta Belling M.D.   On: 05/25/2023 10:10   CT CHEST WO CONTRAST  Result Date: 05/24/2023 CLINICAL DATA:  Lung nodules on abdominal CT. EXAM: CT CHEST WITHOUT CONTRAST TECHNIQUE: Multidetector CT imaging of the chest was performed following the standard protocol without IV contrast. RADIATION DOSE REDUCTION: This exam was performed according to the departmental dose-optimization program which includes automated exposure control, adjustment of the mA and/or kV according to patient size and/or use of iterative reconstruction technique. COMPARISON:  Lung bases from abdominopelvic CT  yesterday no prior chest CT available. FINDINGS: Cardiovascular: The heart is upper normal in size. No pericardial effusion. There are coronary artery calcifications. Normal caliber thoracic aorta. Mediastinum/Nodes: No mediastinal adenopathy. Prominent right axillary nodes measuring up to 12 mm. There is also an 11 mm left axillary node. Limited hilar assessment in the absence of IV contrast. No esophageal wall thickening. Lungs/Pleura: Bilateral pulmonary nodules, many of which are subpleural in location. The largest nodule is in the subpleural left lower lobe measuring 7 x 7 mm, series 4, image 58. There is mild adjacent ground-glass. There is also a benign calcified granuloma in the left lower lobe. No pleural fluid. Trachea and central airways are clear. No features of pulmonary edema. Upper Abdomen: Assessed on abdominopelvic CT yesterday. No acute findings. Musculoskeletal: Mild diffuse thoracic spondylosis with spurring and scattered Schmorl's nodes. There are no acute or suspicious osseous abnormalities. IMPRESSION: 1. Bilateral pulmonary nodules, many of which are subpleural in location. The largest nodule is in the subpleural left lower lobe measuring 7 x 7 mm. There is mild adjacent ground-glass. These are nonspecific, and may be infectious or inflammatory. Per Fleischner Society Guidelines, recommend a non-contrast Chest CT at 3-6 months, then consider another non-contrast Chest CT at 18-24 months. If patient is low risk for malignancy, non-contrast Chest CT at 18-24 months is optional. These guidelines do not apply to immunocompromised patients and patients with cancer. Follow up in patients with significant comorbidities as clinically warranted. For lung cancer screening, adhere to Lung-RADS guidelines. Reference: Radiology. 2017; 284(1):228-43. 2. Prominent right axillary nodes measuring up to 12 mm. There is also an 11 mm left axillary node. These are nonspecific. Recommend attention at follow-up.  3. Coronary artery calcifications. Electronically Signed   By: Narda Rutherford M.D.   On: 05/24/2023 22:34   ECHOCARDIOGRAM COMPLETE  Result Date: 05/24/2023    ECHOCARDIOGRAM REPORT   Patient Name:   Ray Bradley Date of Exam: 05/24/2023 Medical Rec #:  [id number]       Height:       70.0 in Accession #:    [id number]      Weight:       263.4 lb Date of Birth:  11-22-1970      BSA:          2.347 m Patient Age:    51 years        BP:  97/86 mmHg Patient Gender: M               HR:           111 bpm. Exam Location:  Inpatient Procedure: 2D Echo, Cardiac Doppler, Color Doppler and Intracardiac            Opacification Agent Indications:    A fib  History:        Patient has no prior history of Echocardiogram examinations.                 Arrythmias:Atrial Fibrillation, Signs/Symptoms:septic shock;                 Risk Factors:Hypertension and Dyslipidemia.  Sonographer:    Melissa Morford RDCS (AE, PE) Referring Phys: [id number] SUBRINA SUNDIL IMPRESSIONS  1. Left ventricular ejection fraction, by estimation, is 70 to 75%. The left ventricle has hyperdynamic function. The left ventricle has no regional wall motion abnormalities. Left ventricular diastolic parameters are consistent with Grade I diastolic dysfunction (impaired relaxation).  2. Right ventricular systolic function is normal. The right ventricular size is normal.  3. The mitral valve is normal in structure. No evidence of mitral valve regurgitation. No evidence of mitral stenosis.  4. The aortic valve is normal in structure. Aortic valve regurgitation is not visualized. No aortic stenosis is present.  5. The inferior vena cava The infferior vena cava is collapsed, consistent with low left atrial pressure. Conclusion(s)/Recommendation(s): The study suggests relative hypovolemia. Rhythm during the study was sinus tachycardia. FINDINGS  Left Ventricle: Left ventricular ejection fraction, by estimation, is 70 to 75%. The left ventricle has  hyperdynamic function. The left ventricle has no regional wall motion abnormalities. The left ventricular internal cavity size was normal in size. There is no left ventricular hypertrophy. Left ventricular diastolic parameters are consistent with Grade I diastolic dysfunction (impaired relaxation). Right Ventricle: The right ventricular size is normal. No increase in right ventricular wall thickness. Right ventricular systolic function is normal. Left Atrium: Left atrial size was normal in size. Right Atrium: Right atrial size was normal in size. Pericardium: There is no evidence of pericardial effusion. Mitral Valve: The mitral valve is normal in structure. No evidence of mitral valve regurgitation. No evidence of mitral valve stenosis. Tricuspid Valve: The tricuspid valve is normal in structure. Tricuspid valve regurgitation is not demonstrated. No evidence of tricuspid stenosis. Aortic Valve: The aortic valve is normal in structure. Aortic valve regurgitation is not visualized. No aortic stenosis is present. Pulmonic Valve: The pulmonic valve was normal in structure. Pulmonic valve regurgitation is not visualized. No evidence of pulmonic stenosis. Aorta: The aortic root is normal in size and structure. Venous: The inferior vena cava The infferior vena cava is collapsed, consistent with low left atrial pressure. IAS/Shunts: No atrial level shunt detected by color flow Doppler.  LEFT VENTRICLE PLAX 2D LVIDd:         4.70 cm   Diastology LVIDs:         3.60 cm   LV e' medial:    8.38 cm/s LV PW:         1.10 cm   LV E/e' medial:  6.5 LV IVS:        1.00 cm   LV e' lateral:   11.40 cm/s LVOT diam:     2.20 cm   LV E/e' lateral: 4.8 LV SV:         85 LV SV Index:   36 LVOT Area:  3.80 cm  RIGHT VENTRICLE RV S prime:     11.30 cm/s TAPSE (M-mode): 1.7 cm LEFT ATRIUM             Index        RIGHT ATRIUM           Index LA diam:        4.00 cm 1.70 cm/m   RA Area:     15.00 cm LA Vol (A2C):   67.1 ml 28.59 ml/m   RA Volume:   38.10 ml  16.24 ml/m LA Vol (A4C):   61.4 ml 26.16 ml/m LA Biplane Vol: 64.3 ml 27.40 ml/m  AORTIC VALVE LVOT Vmax:   110.00 cm/s LVOT Vmean:  84.000 cm/s LVOT VTI:    0.224 m  AORTA Ao Root diam: 3.40 cm MITRAL VALVE MV Area (PHT): 6.32 cm    SHUNTS MV Decel Time: 120 msec    Systemic VTI:  0.22 m MV E velocity: 54.70 cm/s  Systemic Diam: 2.20 cm MV A velocity: 74.60 cm/s MV E/A ratio:  0.73 Mihai Croitoru MD Electronically signed by Thurmon Fair MD Signature Date/Time: 05/24/2023/10:30:49 AM    Final    CT L-SPINE NO CHARGE  Result Date: 05/24/2023 CLINICAL DATA:  Insert Osteomyelitis, lumbar Lumbar 4 to lumbar 5 vertebra septic arthritis EXAM: CT LUMBAR SPINE WITHOUT CONTRAST TECHNIQUE: Multidetector CT imaging of the lumbar spine was performed without intravenous contrast administration. Multiplanar CT image reconstructions were also generated. RADIATION DOSE REDUCTION: This exam was performed according to the departmental dose-optimization program which includes automated exposure control, adjustment of the mA and/or kV according to patient size and/or use of iterative reconstruction technique. COMPARISON:  CT abdomen and pelvis today and 10/21/2022. FINDINGS: Segmentation: 5 lumbar type vertebrae. Alignment: Normal Vertebrae: No acute fracture or focal pathologic process. Paraspinal and other soft tissues: Normal Disc levels: Disc spaces maintained. Mild right facet arthropathy with erosions on the right at L4-5 as seen on earlier study. This is stable when compared to prior CT from February of this year. This is most compatible with facet arthritis. No evidence of infection. IMPRESSION: No acute bony abnormality. No evidence of discitis or osteomyelitis by CT. Degenerative right facet arthropathy at L4-5 unchanged since prior study. Electronically Signed   By: Charlett Nose M.D.   On: 05/24/2023 00:44   CT ABDOMEN PELVIS WO CONTRAST  Result Date: 05/23/2023 CLINICAL DATA:  Sepsis.  Lower  back pain. EXAM: CT ABDOMEN AND PELVIS WITHOUT CONTRAST TECHNIQUE: Multidetector CT imaging of the abdomen and pelvis was performed following the standard protocol without IV contrast. RADIATION DOSE REDUCTION: This exam was performed according to the departmental dose-optimization program which includes automated exposure control, adjustment of the mA and/or kV according to patient size and/or use of iterative reconstruction technique. COMPARISON:  MRI abdomen 10/22/2022 and CT abdomen and pelvis 10/21/2022 FINDINGS: Lower chest: Multiple new pulmonary nodules are present in the lung bases. For example in the left lower lobe posteriorly on series 3/image 17 measuring 7 mm. Additional nodules are present in the right lower lobe on 3/10 and 3/7 and right middle lobe on 3/5. There is mild ground-glass opacity about each nodule. Hepatobiliary: Cholecystectomy. Unremarkable noncontrast appearance of the liver. No biliary dilation. Pancreas: Unremarkable. Spleen: Unremarkable. Adrenals/Urinary Tract: Stable adrenal glands. No urinary calculi or hydronephrosis. Nondistended thick-walled bladder. Stomach/Bowel: Normal caliber large and small bowel. No bowel wall thickening. The appendix is normal.Stomach is within normal limits. Vascular/Lymphatic: Aortic atherosclerosis. No enlarged abdominal or pelvic  lymph nodes. Reproductive: Unremarkable. Other: No free intraperitoneal fluid or air. Musculoskeletal: No acute fracture. Right L4-L5 facet arthritis with mild erosive change. IMPRESSION: 1. Right L4-L5 facet joint arthritis with mild erosive change. Correlation with site of pain is recommended. If there is concern for septic arthritis, MRI is recommended. 2. Multiple pulmonary nodules are present bilaterally measuring up to 7 mm with surrounding ground-glass opacity. This is nonspecific and can be seen in a variety of infectious, neoplastic, and inflammatory conditions including septic emboli. Consider dedicated CT chest  for further evaluation. 3. Nondistended thick-walled bladder. Correlate with urinalysis. Aortic Atherosclerosis (ICD10-I70.0). Electronically Signed   By: Minerva Fester M.D.   On: 05/23/2023 23:20   DG Chest Port 1 View  Result Date: 05/23/2023 CLINICAL DATA:  Questionable sepsis - evaluate for abnormality EXAM: PORTABLE CHEST - 1 VIEW COMPARISON:  11/19/2013 FINDINGS: Low lung volumes. Mild perihilar and bibasilar interstitial prominence. No confluent airspace disease. Heart size and mediastinal contours are within normal limits. No effusion. Visualized bones unremarkable. IMPRESSION: Low lung volumes with mild perihilar and bibasilar interstitial prominence. Electronically Signed   By: Corlis Leak M.D.   On: 05/23/2023 21:32      Subjective:  Denies any complaints today, no significant events, eager to go home.  Discharge Exam: Vitals:   05/28/23 0815 05/28/23 1120  BP: 139/67 (!) 157/78  Pulse: 83   Resp: (!) 21   Temp:    SpO2:     Vitals:   05/28/23 0000 05/28/23 0523 05/28/23 0815 05/28/23 1120  BP: 112/64 (!) 140/81 139/67 (!) 157/78  Pulse: 79 81 83   Resp: 15 20 (!) 21   Temp: 99.5 F (37.5 C) 98.8 F (37.1 C)    TempSrc: Oral Oral    SpO2: 97% 97%    Weight:      Height:        General: Pt is alert, awake, not in acute distress Cardiovascular: RRR, S1/S2 +, no rubs, no gallops Respiratory: CTA bilaterally, no wheezing, no rhonchi Abdominal: Soft, NT, ND, bowel sounds + Extremities: no edema, no cyanosis    The results of significant diagnostics from this hospitalization (including imaging, microbiology, ancillary and laboratory) are listed below for reference.     Microbiology: Recent Results (from the past 240 hour(s))  Resp panel by RT-PCR (RSV, Flu A&B, Covid) Anterior Nasal Swab     Status: None   Collection Time: 05/23/23  8:52 PM   Specimen: Anterior Nasal Swab  Result Value Ref Range Status   SARS Coronavirus 2 by RT PCR NEGATIVE NEGATIVE Final    Influenza A by PCR NEGATIVE NEGATIVE Final   Influenza B by PCR NEGATIVE NEGATIVE Final    Comment: (NOTE) The Xpert Xpress SARS-CoV-2/FLU/RSV plus assay is intended as an aid in the diagnosis of influenza from Nasopharyngeal swab specimens and should not be used as a sole basis for treatment. Nasal washings and aspirates are unacceptable for Xpert Xpress SARS-CoV-2/FLU/RSV testing.  Fact Sheet for Patients: BloggerCourse.com  Fact Sheet for Healthcare Providers: SeriousBroker.it  This test is not yet approved or cleared by the [place] FDA and has been authorized for detection and/or diagnosis of SARS-CoV-2 by FDA under an Emergency Use Authorization (EUA). This EUA will remain in effect (meaning this test can be used) for the duration of the COVID-19 declaration under Section 564(b)(1) of the Act, 21 U.S.C. section 360bbb-3(b)(1), unless the authorization is terminated or revoked.     Resp Syncytial Virus by PCR NEGATIVE NEGATIVE  Final    Comment: (NOTE) Fact Sheet for Patients: BloggerCourse.com  Fact Sheet for Healthcare Providers: SeriousBroker.it  This test is not yet approved or cleared by the [place] FDA and has been authorized for detection and/or diagnosis of SARS-CoV-2 by FDA under an Emergency Use Authorization (EUA). This EUA will remain in effect (meaning this test can be used) for the duration of the COVID-19 declaration under Section 564(b)(1) of the Act, 21 U.S.C. section 360bbb-3(b)(1), unless the authorization is terminated or revoked.  Performed at [facility], [address] [place], [place] [address]   Blood Culture (routine x 2)     Status: Abnormal (Preliminary result)   Collection Time: 05/23/23  8:52 PM   Specimen: BLOOD  Result Value Ref Range Status   Specimen Description BLOOD SITE NOT SPECIFIED  Final   Special Requests    Final    BOTTLES DRAWN AEROBIC AND ANAEROBIC Blood Culture results may not be optimal due to an inadequate volume of blood received in culture bottles   Culture  Setup Time   Final    GRAM NEGATIVE RODS GRAM POSITIVE RODS BOTTLES DRAWN AEROBIC ONLY CRITICAL RESULT CALLED TO, READ BACK BY AND VERIFIED WITH: BRYK,PHARMD@2349  05/24/23 MK    Culture (A)  Final    SERRATIA MARCESCENS CULTURE REINCUBATED FOR BETTER GROWTH Performed at [facility], [address], [place], [place] [address]    Report Status PENDING  Incomplete   Organism ID, Bacteria SERRATIA MARCESCENS  Final      Susceptibility   Serratia marcescens - MIC*    CEFEPIME <=0.12 SENSITIVE Sensitive     CEFTAZIDIME <=1 SENSITIVE Sensitive     CEFTRIAXONE <=0.25 SENSITIVE Sensitive     CIPROFLOXACIN <=0.25 SENSITIVE Sensitive     GENTAMICIN <=1 SENSITIVE Sensitive     TRIMETH/SULFA <=20 SENSITIVE Sensitive     * SERRATIA MARCESCENS  Blood Culture ID Panel (Reflexed)     Status: Abnormal   Collection Time: 05/23/23  8:52 PM  Result Value Ref Range Status   Enterococcus faecalis NOT DETECTED NOT DETECTED Final   Enterococcus Faecium NOT DETECTED NOT DETECTED Final   Listeria monocytogenes NOT DETECTED NOT DETECTED Final   Staphylococcus species NOT DETECTED NOT DETECTED Final   Staphylococcus aureus (BCID) NOT DETECTED NOT DETECTED Final   Staphylococcus epidermidis NOT DETECTED NOT DETECTED Final   Staphylococcus lugdunensis NOT DETECTED NOT DETECTED Final   Streptococcus species NOT DETECTED NOT DETECTED Final   Streptococcus agalactiae NOT DETECTED NOT DETECTED Final   Streptococcus pneumoniae NOT DETECTED NOT DETECTED Final   Streptococcus pyogenes NOT DETECTED NOT DETECTED Final   A.calcoaceticus-baumannii NOT DETECTED NOT DETECTED Final   Bacteroides fragilis NOT DETECTED NOT DETECTED Final   Enterobacterales DETECTED (A) NOT DETECTED Final    Comment: Enterobacterales represent a large order of gram  negative bacteria, not a single organism. CRITICAL RESULT CALLED TO, READ BACK BY AND VERIFIED WITH: V BRYK,PHARMD@2349  05/24/23 MK    Enterobacter cloacae complex NOT DETECTED NOT DETECTED Final   Escherichia coli NOT DETECTED NOT DETECTED Final   Klebsiella aerogenes NOT DETECTED NOT DETECTED Final   Klebsiella oxytoca NOT DETECTED NOT DETECTED Final   Klebsiella pneumoniae NOT DETECTED NOT DETECTED Final   Proteus species NOT DETECTED NOT DETECTED Final   Salmonella species NOT DETECTED NOT DETECTED Final   Serratia marcescens DETECTED (A) NOT DETECTED Final    Comment: CRITICAL RESULT CALLED TO, READ BACK BY AND VERIFIED WITH: V BRYK,PHARMD@2349  05/24/23 MK  Haemophilus influenzae NOT DETECTED NOT DETECTED Final   Neisseria meningitidis NOT DETECTED NOT DETECTED Final   Pseudomonas aeruginosa NOT DETECTED NOT DETECTED Final   Stenotrophomonas maltophilia NOT DETECTED NOT DETECTED Final   Candida albicans NOT DETECTED NOT DETECTED Final   Candida auris NOT DETECTED NOT DETECTED Final   Candida glabrata NOT DETECTED NOT DETECTED Final   Candida krusei NOT DETECTED NOT DETECTED Final   Candida parapsilosis NOT DETECTED NOT DETECTED Final   Candida tropicalis NOT DETECTED NOT DETECTED Final   Cryptococcus neoformans/gattii NOT DETECTED NOT DETECTED Final   CTX-M ESBL NOT DETECTED NOT DETECTED Final   Carbapenem resistance IMP NOT DETECTED NOT DETECTED Final   Carbapenem resistance KPC NOT DETECTED NOT DETECTED Final   Carbapenem resistance NDM NOT DETECTED NOT DETECTED Final   Carbapenem resist OXA 48 LIKE NOT DETECTED NOT DETECTED Final   Carbapenem resistance VIM NOT DETECTED NOT DETECTED Final    Comment: Performed at [facility], [address] [place], [place] [address]  Blood Culture (routine x 2)     Status: Abnormal (Preliminary result)   Collection Time: 05/23/23  8:55 PM   Specimen: BLOOD  Result Value Ref Range Status   Specimen Description BLOOD SITE NOT  SPECIFIED  Final   Special Requests   Final    BOTTLES DRAWN AEROBIC AND ANAEROBIC Blood Culture results may not be optimal due to an inadequate volume of blood received in culture bottles   Culture  Setup Time   Final    GRAM POSITIVE RODS ANAEROBIC BOTTLE ONLY CRITICAL RESULT CALLED TO, READ BACK BY AND VERIFIED WITH: PHARMD J. MILLEN [id number] @ 1719 FH  Performed at [facility], [address], [place], [place] [address]    Culture ANAEROBIC GRAM POSITIVE RODS (A)  Final   Report Status PENDING  Incomplete  Gastrointestinal Panel by PCR , Stool     Status: None   Collection Time: 05/24/23  6:13 PM   Specimen: Stool  Result Value Ref Range Status   Campylobacter species NOT DETECTED NOT DETECTED Final   Plesimonas shigelloides NOT DETECTED NOT DETECTED Final   Salmonella species NOT DETECTED NOT DETECTED Final   Yersinia enterocolitica NOT DETECTED NOT DETECTED Final   Vibrio species NOT DETECTED NOT DETECTED Final   Vibrio cholerae NOT DETECTED NOT DETECTED Final   Enteroaggregative E coli (EAEC) NOT DETECTED NOT DETECTED Final   Enteropathogenic E coli (EPEC) NOT DETECTED NOT DETECTED Final   Enterotoxigenic E coli (ETEC) NOT DETECTED NOT DETECTED Final   Shiga like toxin producing E coli (STEC) NOT DETECTED NOT DETECTED Final   Shigella/Enteroinvasive E coli (EIEC) NOT DETECTED NOT DETECTED Final   Cryptosporidium NOT DETECTED NOT DETECTED Final   Cyclospora cayetanensis NOT DETECTED NOT DETECTED Final   Entamoeba histolytica NOT DETECTED NOT DETECTED Final   Giardia lamblia NOT DETECTED NOT DETECTED Final   Adenovirus F40/41 NOT DETECTED NOT DETECTED Final   Astrovirus NOT DETECTED NOT DETECTED Final   Norovirus GI/GII NOT DETECTED NOT DETECTED Final   Rotavirus A NOT DETECTED NOT DETECTED Final   Sapovirus (I, II, IV, and V) NOT DETECTED NOT DETECTED Final    Comment: Performed at [facility], [address] [place], [place] [address]  OVA + PARASITE EXAM      Status: None   Collection Time: 05/24/23  6:13 PM   Specimen: Stool  Result Value Ref Range Status   OVA + PARASITE EXAM Final report  Final  Comment: (NOTE) These results were obtained using wet preparation(s) and trichrome stained smear. This test does not include testing for Cryptosporidium parvum, Cyclospora, or Microsporidia. Performed At: [facility] [address] [place], [place] [id number] Jolene Schimke MD BJ:[id number]    Source of Sample STOOL  Final    Comment: Performed at [facility], [address] [place], [place] [address]  C Difficile Quick Screen w PCR reflex     Status: None   Collection Time: 05/24/23  6:14 PM   Specimen: STOOL  Result Value Ref Range Status   C Diff antigen NEGATIVE NEGATIVE Final   C Diff toxin NEGATIVE NEGATIVE Final   C Diff interpretation No C. difficile detected.  Final    Comment: Performed at [facility], [address], [place], [place] [address]  MRSA Next Gen by PCR, Nasal     Status: None   Collection Time: 05/24/23  8:19 PM   Specimen: Nasal Mucosa; Nasal Swab  Result Value Ref Range Status   MRSA by PCR Next Gen NOT DETECTED NOT DETECTED Final    Comment: (NOTE) The GeneXpert MRSA Assay (FDA approved for NASAL specimens only), is one component of a comprehensive MRSA colonization surveillance program. It is not intended to diagnose MRSA infection nor to guide or monitor treatment for MRSA infections. Test performance is not FDA approved in patients less than 52 years old. Performed at [facility], [address], [place], [place] [address]      Labs: BNP (last 3 results) Recent Labs    05/23/23 2044  BNP 25.9   Basic Metabolic Panel: Recent Labs  Lab 05/25/23 0231 05/25/23 1600 05/25/23 2355 05/26/23 0614 05/27/23 0012 05/27/23 0341 05/27/23 1147 05/28/23 0825  NA 134* 134*  --  135  --  134*  --  134*  K 4.0 4.0  --  3.5  --  3.0*  --  3.5  CL 94* 97*  --  95*  --  98   --  96*  CO2 17* 21*  --  21*  --  21*  --  26  GLUCOSE 102* 79  --  87  --  129*  --  122*  BUN 87* 49*  --  65*  --  84*  --  60*  CREATININE 14.29* 8.02*  --  10.10*  --  11.79*  --  9.33*  CALCIUM 7.6* 7.8*  --  8.6*  --  8.7*  --  8.6*  MG 1.5* 1.5* 2.5* 2.6* 2.8*  --  3.0*  --   PHOS  --   --   --  5.3*  --   --   --   --    Liver Function Tests: Recent Labs  Lab 05/24/23 0218 05/24/23 1145 05/25/23 0231 05/26/23 0614 05/27/23 0341  AST 158* 121* 104* 53* 39  ALT 147* 152* 160* 98* 73*  ALKPHOS 124 121 111 129* 153*  BILITOT 1.8* 2.6* 3.4* 4.1* 4.7*  PROT 6.2* 6.5 6.5 6.0* 6.1*  ALBUMIN 2.9* 2.9* 2.9* 2.4* 2.4*   No results for input(s): "LIPASE", "AMYLASE" in the last 168 hours. No results for input(s): "AMMONIA" in the last 168 hours. CBC: Recent Labs  Lab 05/23/23 2155 05/24/23 0218 05/25/23 0231 05/26/23 0614 05/27/23 0341 05/28/23 0825  WBC 1.6* 3.4* 24.1* 26.4* 24.2* 15.7*  NEUTROABS 1.3*  --   --   --   --   --   HGB 9.6* 9.4* 9.4*  9.1* 8.7* 9.8*  HCT 29.4* 29.0* 27.8* 26.7* 25.6* 29.4*  MCV 84.2 85.5 82.2 82.7 80.5 81.9  PLT 182 171 126* 100* 100* 103*   Cardiac Enzymes: No results for input(s): "CKTOTAL", "CKMB", "CKMBINDEX", "TROPONINI" in the last 168 hours. BNP: Invalid input(s): "POCBNP" CBG: Recent Labs  Lab 05/25/23 2324 05/26/23 0342 05/26/23 0746 05/27/23 0834 05/27/23 1157  GLUCAP 94 83 78 118* 127*   D-Dimer No results for input(s): "DDIMER" in the last 72 hours. Hgb A1c Recent Labs    05/27/23 0341  HGBA1C 6.7*   Lipid Profile No results for input(s): "CHOL", "HDL", "LDLCALC", "TRIG", "CHOLHDL", "LDLDIRECT" in the last 72 hours. Thyroid function studies No results for input(s): "TSH", "T4TOTAL", "T3FREE", "THYROIDAB" in the last 72 hours.  Invalid input(s): "FREET3" Anemia work up Recent Labs    05/28/23 0825  FERRITIN 2,144*   Urinalysis    Component Value Date/Time   COLORURINE YELLOW 05/25/2021 1106    APPEARANCEUR HAZY (A) 05/25/2021 1106   LABSPEC 1.005 05/25/2021 1106   PHURINE 6.0 05/25/2021 1106   GLUCOSEU 50 (A) 05/25/2021 1106   HGBUR MODERATE (A) 05/25/2021 1106   BILIRUBINUR NEGATIVE 05/25/2021 1106   BILIRUBINUR negative 05/25/2021 1001   KETONESUR NEGATIVE 05/25/2021 1106   KETONESUR negative 05/25/2021 1001   PROTEINUR 100 (A) 05/25/2021 1106   PROTEINUR =100 (A) 05/25/2021 1001   UROBILINOGEN 0.2 05/25/2021 1001   NITRITE NEGATIVE 05/25/2021 1106   NITRITE Negative 05/25/2021 1001   LEUKOCYTESUR LARGE (A) 05/25/2021 1106   LEUKOCYTESUR Trace (A) 05/25/2021 1001   Sepsis Labs Recent Labs  Lab 05/25/23 0231 05/26/23 0614 05/27/23 0341 05/28/23 0825  WBC 24.1* 26.4* 24.2* 15.7*   Microbiology Recent Results (from the past 240 hour(s))  Resp panel by RT-PCR (RSV, Flu A&B, Covid) Anterior Nasal Swab     Status: None   Collection Time: 05/23/23  8:52 PM   Specimen: Anterior Nasal Swab  Result Value Ref Range Status   SARS Coronavirus 2 by RT PCR NEGATIVE NEGATIVE Final   Influenza A by PCR NEGATIVE NEGATIVE Final   Influenza B by PCR NEGATIVE NEGATIVE Final    Comment: (NOTE) The Xpert Xpress SARS-CoV-2/FLU/RSV plus assay is intended as an aid in the diagnosis of influenza from Nasopharyngeal swab specimens and should not be used as a sole basis for treatment. Nasal washings and aspirates are unacceptable for Xpert Xpress SARS-CoV-2/FLU/RSV testing.  Fact Sheet for Patients: BloggerCourse.com  Fact Sheet for Healthcare Providers: SeriousBroker.it  This test is not yet approved or cleared by the [place] FDA and has been authorized for detection and/or diagnosis of SARS-CoV-2 by FDA under an Emergency Use Authorization (EUA). This EUA will remain in effect (meaning this test can be used) for the duration of the COVID-19 declaration under Section 564(b)(1) of the Act, 21 U.S.C. section 360bbb-3(b)(1),  unless the authorization is terminated or revoked.     Resp Syncytial Virus by PCR NEGATIVE NEGATIVE Final    Comment: (NOTE) Fact Sheet for Patients: BloggerCourse.com  Fact Sheet for Healthcare Providers: SeriousBroker.it  This test is not yet approved or cleared by the [place] FDA and has been authorized for detection and/or diagnosis of SARS-CoV-2 by FDA under an Emergency Use Authorization (EUA). This EUA will remain in effect (meaning this test can be used) for the duration of the COVID-19 declaration under Section 564(b)(1) of the Act, 21 U.S.C. section 360bbb-3(b)(1), unless the authorization is terminated or revoked.  Performed at [facility], [address], [place], [place] [address]   Blood Culture (routine x 2)     Status: Abnormal (Preliminary result)   Collection Time: 05/23/23  8:52 PM   Specimen: BLOOD  Result Value Ref Range Status   Specimen Description BLOOD SITE NOT SPECIFIED  Final   Special Requests   Final    BOTTLES DRAWN AEROBIC AND ANAEROBIC Blood Culture results may not be optimal due to an inadequate volume of blood received in culture bottles   Culture  Setup Time   Final    GRAM NEGATIVE RODS GRAM POSITIVE RODS BOTTLES DRAWN AEROBIC ONLY CRITICAL RESULT CALLED TO, READ BACK BY AND VERIFIED WITH: BRYK,PHARMD@2349  05/24/23 MK    Culture (A)  Final    SERRATIA MARCESCENS CULTURE REINCUBATED FOR BETTER GROWTH Performed at [facility], [address] [place], [place] [address]    Report Status PENDING  Incomplete   Organism ID, Bacteria SERRATIA MARCESCENS  Final      Susceptibility   Serratia marcescens - MIC*    CEFEPIME <=0.12 SENSITIVE Sensitive     CEFTAZIDIME <=1 SENSITIVE Sensitive     CEFTRIAXONE <=0.25 SENSITIVE Sensitive     CIPROFLOXACIN <=0.25 SENSITIVE Sensitive     GENTAMICIN <=1 SENSITIVE Sensitive     TRIMETH/SULFA <=20 SENSITIVE Sensitive     * SERRATIA  MARCESCENS  Blood Culture ID Panel (Reflexed)     Status: Abnormal   Collection Time: 05/23/23  8:52 PM  Result Value Ref Range Status   Enterococcus faecalis NOT DETECTED NOT DETECTED Final   Enterococcus Faecium NOT DETECTED NOT DETECTED Final   Listeria monocytogenes NOT DETECTED NOT DETECTED Final   Staphylococcus species NOT DETECTED NOT DETECTED Final   Staphylococcus aureus (BCID) NOT DETECTED NOT DETECTED Final   Staphylococcus epidermidis NOT DETECTED NOT DETECTED Final   Staphylococcus lugdunensis NOT DETECTED NOT DETECTED Final   Streptococcus species NOT DETECTED NOT DETECTED Final   Streptococcus agalactiae NOT DETECTED NOT DETECTED Final   Streptococcus pneumoniae NOT DETECTED NOT DETECTED Final   Streptococcus pyogenes NOT DETECTED NOT DETECTED Final   A.calcoaceticus-baumannii NOT DETECTED NOT DETECTED Final   Bacteroides fragilis NOT DETECTED NOT DETECTED Final   Enterobacterales DETECTED (A) NOT DETECTED Final    Comment: Enterobacterales represent a large order of gram negative bacteria, not a single organism. CRITICAL RESULT CALLED TO, READ BACK BY AND VERIFIED WITH: V BRYK,PHARMD@2349  05/24/23 MK    Enterobacter cloacae complex NOT DETECTED NOT DETECTED Final   Escherichia coli NOT DETECTED NOT DETECTED Final   Klebsiella aerogenes NOT DETECTED NOT DETECTED Final   Klebsiella oxytoca NOT DETECTED NOT DETECTED Final   Klebsiella pneumoniae NOT DETECTED NOT DETECTED Final   Proteus species NOT DETECTED NOT DETECTED Final   Salmonella species NOT DETECTED NOT DETECTED Final   Serratia marcescens DETECTED (A) NOT DETECTED Final    Comment: CRITICAL RESULT CALLED TO, READ BACK BY AND VERIFIED WITH: V BRYK,PHARMD@2349  05/24/23 MK    Haemophilus influenzae NOT DETECTED NOT DETECTED Final   Neisseria meningitidis NOT DETECTED NOT DETECTED Final   Pseudomonas aeruginosa NOT DETECTED NOT DETECTED Final   Stenotrophomonas maltophilia NOT DETECTED NOT DETECTED Final    Candida albicans NOT DETECTED NOT DETECTED Final   Candida auris NOT DETECTED NOT DETECTED Final   Candida glabrata NOT DETECTED NOT DETECTED Final   Candida krusei NOT DETECTED NOT DETECTED Final   Candida parapsilosis NOT DETECTED NOT DETECTED Final   Candida tropicalis NOT DETECTED NOT DETECTED Final   Cryptococcus neoformans/gattii  NOT DETECTED NOT DETECTED Final   CTX-M ESBL NOT DETECTED NOT DETECTED Final   Carbapenem resistance IMP NOT DETECTED NOT DETECTED Final   Carbapenem resistance KPC NOT DETECTED NOT DETECTED Final   Carbapenem resistance NDM NOT DETECTED NOT DETECTED Final   Carbapenem resist OXA 48 LIKE NOT DETECTED NOT DETECTED Final   Carbapenem resistance VIM NOT DETECTED NOT DETECTED Final    Comment: Performed at [facility], [address], [place], [place] [address]  Blood Culture (routine x 2)     Status: Abnormal (Preliminary result)   Collection Time: 05/23/23  8:55 PM   Specimen: BLOOD  Result Value Ref Range Status   Specimen Description BLOOD SITE NOT SPECIFIED  Final   Special Requests   Final    BOTTLES DRAWN AEROBIC AND ANAEROBIC Blood Culture results may not be optimal due to an inadequate volume of blood received in culture bottles   Culture  Setup Time   Final    GRAM POSITIVE RODS ANAEROBIC BOTTLE ONLY CRITICAL RESULT CALLED TO, READ BACK BY AND VERIFIED WITH: PHARMD J. MILLEN [id number] @ 1719 FH  Performed at [facility], [address], [place], [place] [address]    Culture ANAEROBIC GRAM POSITIVE RODS (A)  Final   Report Status PENDING  Incomplete  Gastrointestinal Panel by PCR , Stool     Status: None   Collection Time: 05/24/23  6:13 PM   Specimen: Stool  Result Value Ref Range Status   Campylobacter species NOT DETECTED NOT DETECTED Final   Plesimonas shigelloides NOT DETECTED NOT DETECTED Final   Salmonella species NOT DETECTED NOT DETECTED Final   Yersinia enterocolitica NOT DETECTED NOT DETECTED Final   Vibrio species NOT  DETECTED NOT DETECTED Final   Vibrio cholerae NOT DETECTED NOT DETECTED Final   Enteroaggregative E coli (EAEC) NOT DETECTED NOT DETECTED Final   Enteropathogenic E coli (EPEC) NOT DETECTED NOT DETECTED Final   Enterotoxigenic E coli (ETEC) NOT DETECTED NOT DETECTED Final   Shiga like toxin producing E coli (STEC) NOT DETECTED NOT DETECTED Final   Shigella/Enteroinvasive E coli (EIEC) NOT DETECTED NOT DETECTED Final   Cryptosporidium NOT DETECTED NOT DETECTED Final   Cyclospora cayetanensis NOT DETECTED NOT DETECTED Final   Entamoeba histolytica NOT DETECTED NOT DETECTED Final   Giardia lamblia NOT DETECTED NOT DETECTED Final   Adenovirus F40/41 NOT DETECTED NOT DETECTED Final   Astrovirus NOT DETECTED NOT DETECTED Final   Norovirus GI/GII NOT DETECTED NOT DETECTED Final   Rotavirus A NOT DETECTED NOT DETECTED Final   Sapovirus (I, II, IV, and V) NOT DETECTED NOT DETECTED Final    Comment: Performed at [facility], [address] [place], [place] [address]  OVA + PARASITE EXAM     Status: None   Collection Time: 05/24/23  6:13 PM   Specimen: Stool  Result Value Ref Range Status   OVA + PARASITE EXAM Final report  Final    Comment: (NOTE) These results were obtained using wet preparation(s) and trichrome stained smear. This test does not include testing for Cryptosporidium parvum, Cyclospora, or Microsporidia. Performed At: [facility] [address] [place], [place] [id number] Jolene Schimke MD JJ:[id number]    Source of Sample STOOL  Final    Comment: Performed at [facility], [address] [place], [place] [address]  C Difficile Quick Screen w PCR reflex     Status: None   Collection Time: 05/24/23  6:14 PM  Specimen: STOOL  Result Value Ref Range Status   C Diff antigen NEGATIVE NEGATIVE Final   C Diff toxin NEGATIVE NEGATIVE Final   C Diff interpretation No C. difficile detected.  Final    Comment: Performed at [facility], [address], [place], [place] [address]  MRSA Next Gen by PCR, Nasal     Status: None   Collection Time: 05/24/23  8:19 PM   Specimen: Nasal Mucosa; Nasal Swab  Result Value Ref Range Status   MRSA by PCR Next Gen NOT DETECTED NOT DETECTED Final    Comment: (NOTE) The GeneXpert MRSA Assay (FDA approved for NASAL specimens only), is one component of a comprehensive MRSA colonization surveillance program. It is not intended to diagnose MRSA infection nor to guide or monitor treatment for MRSA infections. Test performance is not FDA approved in patients less than 63 years old. Performed at [facility], [address], [place], [place] [address]      Time coordinating discharge: Over 30 minutes  SIGNED:   Huey Bienenstock, MD  [facility] 05/28/2023, 3:05 PM Pager   If 7PM-7AM, please contact night-coverage [email or web address] Password [email or web address]

## 2023-05-29 LAB — CULTURE, BLOOD (ROUTINE X 2)

## 2023-05-30 NOTE — Progress Notes (Signed)
Late Note Entry- May 30, 2023  Pt was d/c on Saturday. Contacted [place] home therapy dept this morning to provide pt's d/c date to staff. Staff aware of pt's d/c and did a home visit on Saturday evening.   Olivia Canter Renal Navigator [phone]

## 2023-06-02 LAB — MISC LABCORP TEST (SEND OUT): Labcorp test code: 182345

## 2023-06-10 LAB — CULTURE, BLOOD (ROUTINE X 2)

## 2023-06-16 ENCOUNTER — Other Ambulatory Visit (HOSPITAL_COMMUNITY): Payer: Self-pay

## 2023-06-16 MED ORDER — PROMETHAZINE-CODEINE 6.25-10 MG/5ML PO SOLN
2.5000 mL | Freq: Every evening | ORAL | 0 refills | Status: AC | PRN
Start: 2023-06-16 — End: ?

## 2023-06-20 ENCOUNTER — Encounter: Payer: Self-pay | Admitting: Hematology and Oncology

## 2023-06-20 ENCOUNTER — Inpatient Hospital Stay: Payer: Medicare Other | Attending: Hematology and Oncology | Admitting: Hematology and Oncology

## 2023-06-20 DIAGNOSIS — Z79899 Other long term (current) drug therapy: Secondary | ICD-10-CM | POA: Diagnosis not present

## 2023-06-20 DIAGNOSIS — I4891 Unspecified atrial fibrillation: Secondary | ICD-10-CM | POA: Diagnosis not present

## 2023-06-20 DIAGNOSIS — R918 Other nonspecific abnormal finding of lung field: Secondary | ICD-10-CM | POA: Insufficient documentation

## 2023-06-20 DIAGNOSIS — M869 Osteomyelitis, unspecified: Secondary | ICD-10-CM | POA: Diagnosis not present

## 2023-06-20 DIAGNOSIS — I120 Hypertensive chronic kidney disease with stage 5 chronic kidney disease or end stage renal disease: Secondary | ICD-10-CM | POA: Diagnosis present

## 2023-06-20 DIAGNOSIS — E1169 Type 2 diabetes mellitus with other specified complication: Secondary | ICD-10-CM | POA: Diagnosis not present

## 2023-06-20 DIAGNOSIS — J45909 Unspecified asthma, uncomplicated: Secondary | ICD-10-CM | POA: Insufficient documentation

## 2023-06-20 DIAGNOSIS — Z992 Dependence on renal dialysis: Secondary | ICD-10-CM | POA: Insufficient documentation

## 2023-06-20 DIAGNOSIS — Z7982 Long term (current) use of aspirin: Secondary | ICD-10-CM | POA: Diagnosis not present

## 2023-06-20 DIAGNOSIS — E785 Hyperlipidemia, unspecified: Secondary | ICD-10-CM | POA: Insufficient documentation

## 2023-06-20 DIAGNOSIS — D631 Anemia in chronic kidney disease: Secondary | ICD-10-CM | POA: Insufficient documentation

## 2023-06-20 DIAGNOSIS — E1122 Type 2 diabetes mellitus with diabetic chronic kidney disease: Secondary | ICD-10-CM | POA: Insufficient documentation

## 2023-06-20 DIAGNOSIS — N186 End stage renal disease: Secondary | ICD-10-CM | POA: Diagnosis not present

## 2023-06-20 NOTE — Progress Notes (Signed)
[name] CONSULT NOTE  Patient Care Team: Tally Joe, MD as PCP - General (Family Medicine)  ASSESSMENT & PLAN:  Iron overload The patient has signs of iron overload with very high ferritin level and signs of iron deposition from his MRI in February 2024 He has not received intravenous iron from dialysis for over a year I recommend the patient to make sure that he is not taking any vitamin supplement that contains iron I will order tests to look for hemochromatosis  I recommend phlebotomy every 3 weeks or so in an attempt to bring his ferritin level down The limiting factor would be his anemia chronic kidney failure; he might need more frequent doses of erythropoietin stimulating agent while undergoing phlebotomy I will only be checking his ferritin level periodically He will proceed with phlebotomy whenever his hemoglobin is greater than 9 I will see him in a few months for further follow-up  Anemia in chronic kidney disease We will check his CBC before each phlebotomy session and we will omit phlebotomy if his hemoglobin is under 9 g  Multiple lung nodules on CT These are nonspecific Plan to repeat imaging again at the end of the year  Orders Placed This Encounter  Procedures   Hemochromatosis DNA, PCR    Standing Status:   Future    Standing Expiration Date:   06/19/2024   CBC with Differential/Platelet    Standing Status:   Standing    Number of Occurrences:   22    Standing Expiration Date:   06/19/2024   Ferritin    Standing Status:   Standing    Number of Occurrences:   5    Standing Expiration Date:   06/19/2024    The total time spent in the appointment was 60 minutes encounter with patients including review of chart and various tests results, discussions about plan of care and coordination of care plan   All questions were answered. The patient knows to call the clinic with any problems, questions or concerns. No barriers to learning was  detected.  Artis Delay, MD 9/30/20249:22 AM  CHIEF COMPLAINTS/PURPOSE OF CONSULTATION:  Iron overload, on background history of anemia chronic renal failure on hemodialysis  HISTORY OF PRESENTING ILLNESS:  Ray Bradley 52 y.o. male is here because of recent findings of elevated ferritin level The patient is here accompanied by his partner He has been on hemodialysis since the year of 2020, due to complication from diabetes and hypertension Around February of this year, he underwent cholecystectomy.  He had MRI done at that time which show evidence of iron deposition in his liver, spleen and bone marrow The patient had received intravenous iron through hemodialysis for few years but nothing for the past year or so He is currently doing hemodialysis at home, typically on the frequency of 2 days on and 1 day off He has been receiving intermittent doses of erythropoietin stimulating agent from the dialysis center perhaps once a month He denies recent bleeding  MEDICAL HISTORY:  Past Medical History:  Diagnosis Date   Anemia    Asthma    as a child   Chest pain    Chronic kidney disease    dialysis - at home hemodialysis -does 2 days in a row with one day off   COVID    2022 - mild case   History of kidney stones    HTN (hypertension)    Hyperlipidemia    Pneumonia    as a  child   Type II or unspecified type diabetes mellitus without mention of complication, not stated as uncontrolled    Wears glasses     SURGICAL HISTORY: Past Surgical History:  Procedure Laterality Date   AV FISTULA PLACEMENT Left 08/24/2019   Procedure: CREATION OF ARTERIOVENOUS (AV) FISTULA  LEFT ARM;  Surgeon: Cephus Shelling, MD;  Location: [facility] OR;  Service: Vascular;  Laterality: Left;   CARPAL TUNNEL RELEASE Left 12/30/2022   Procedure: LEFT CARPAL TUNNEL RELEASE;  Surgeon: Betha Loa, MD;  Location: [facility] OR;  Service: Orthopedics;  Laterality: Left;  30 MIN   CHOLECYSTECTOMY N/A 10/25/2022    Procedure: LAPAROSCOPIC CHOLECYSTECTOMY;  Surgeon: Diamantina Monks, MD;  Location: [facility] OR;  Service: General;  Laterality: N/A;   CIRCUMCISION, NON-NEWBORN  2004   COLONOSCOPY W/ BIOPSIES AND POLYPECTOMY     EYE SURGERY     FOOT SURGERY     IR FLUORO GUIDE CV LINE RIGHT  07/23/2019   IR FLUORO GUIDE CV LINE RIGHT  09/19/2019   IR US GUIDE VASC ACCESS RIGHT  07/23/2019   MULTIPLE TOOTH EXTRACTIONS      SOCIAL HISTORY: Social History   Socioeconomic History   Marital status: Married    Spouse name: Not on file   Number of children: 3   Years of education: Not on file   Highest education level: Not on file  Occupational History   Not on file  Tobacco Use   Smoking status: Never    Passive exposure: Past   Smokeless tobacco: Never  Vaping Use   Vaping status: Never Used  Substance and Sexual Activity   Alcohol use: Not Currently   Drug use: No   Sexual activity: Yes    Birth control/protection: None  Other Topics Concern   Not on file  Social History Narrative   Lives with wife and 3 children.     Social Determinants of Health   Financial Resource Strain: Not on file  Food Insecurity: No Food Insecurity (05/26/2023)   Hunger Vital Sign    Worried About Running Out of Food in the Last Year: Never true    Ran Out of Food in the Last Year: Never true  Transportation Needs: No Transportation Needs (05/26/2023)   PRAPARE - Administrator, Civil Service (Medical): No    Lack of Transportation (Non-Medical): No  Physical Activity: Not on file  Stress: Not on file  Social Connections: Not on file  Intimate Partner Violence: Not At Risk (05/26/2023)   Humiliation, Afraid, Rape, and Kick questionnaire    Fear of Current or Ex-Partner: No    Emotionally Abused: No    Physically Abused: No    Sexually Abused: No    FAMILY HISTORY: Family History  Problem Relation Age of Onset   Colon cancer Father    Diabetes Other        family history   Hypertension Other         family history   Aneurysm Mother        died age 56, brain   Hypertension Mother    Hypertension Sister 93   Hypertension Sister 71   Hypertension Brother 22    ALLERGIES:  is allergic to pork-derived products.  MEDICATIONS:  Current Outpatient Medications  Medication Sig Dispense Refill   amLODipine (NORVASC) 10 MG tablet Take 1 tablet (10 mg total) by mouth daily.     aspirin EC 81 MG tablet Take 81 mg by mouth daily.  B Complex-C-Zn-Folic Acid (DIALYVITE 800-ZINC 15) 0.8 MG TABS Take 1 tablet by mouth daily.     calcitRIOL (ROCALTROL) 0.5 MCG capsule Take 0.5 mcg by mouth daily.     carvedilol (COREG) 12.5 MG tablet Take 12.5 mg by mouth 2 (two) times daily.     cyanocobalamin (VITAMIN B12) 1000 MCG tablet Take 1,000 mcg by mouth daily.     heparin sodium, porcine, 1000 UNIT/ML injection Inject 4,000 Units into the vein See admin instructions. 2 days on, 1 day off.     lidocaine (LIDODERM) 5 % Place 1 patch onto the skin daily as needed (musculoskeletal pain). Remove & Discard patch within 12 hours or as directed by MD 30 patch 0   losartan (COZAAR) 50 MG tablet Take 1 tablet (50 mg total) by mouth daily.     Promethazine-Codeine 6.25-10 MG/5ML SOLN Take 2.5 mLs by mouth at bedtime as needed. 12.5 mL 0   Semaglutide (OZEMPIC, 0.25 OR 0.5 MG/DOSE, Clinch) Inject 0.5 mg into the skin once. Wednesday     sevelamer carbonate (RENVELA) 800 MG tablet Take 1 tablet (800 mg total) by mouth 3 (three) times daily with meals. 90 tablet 0   No current facility-administered medications for this visit.    REVIEW OF SYSTEMS:   Constitutional: Denies fevers, chills or abnormal night sweats Eyes: Denies blurriness of vision, double vision or watery eyes Ears, nose, mouth, throat, and face: Denies mucositis or sore throat Respiratory: Denies cough, dyspnea or wheezes Cardiovascular: Denies palpitation, chest discomfort or lower extremity swelling Gastrointestinal:  Denies nausea, heartburn or  change in bowel habits Skin: Denies abnormal skin rashes Lymphatics: Denies new lymphadenopathy or easy bruising Neurological:Denies numbness, tingling or new weaknesses Behavioral/Psych: Mood is stable, no new changes  All other systems were reviewed with the patient and are negative.  PHYSICAL EXAMINATION: ECOG PERFORMANCE STATUS: 1 - Symptomatic but completely ambulatory  Vitals:   06/20/23 0850  BP: (!) 153/77  Pulse: 79  Resp: 16  Temp: 98.7 F (37.1 C)  SpO2: 98%   Filed Weights   06/20/23 0850  Weight: 251 lb 4 oz (114 kg)    GENERAL:alert, no distress and comfortable NEURO: no focal motor/sensory deficits  LABORATORY DATA:  I have reviewed the data as listed Lab Results  Component Value Date   WBC 15.7 (H) 05/28/2023   HGB 9.8 (L) 05/28/2023   HCT 29.4 (L) 05/28/2023   MCV 81.9 05/28/2023   PLT 103 (L) 05/28/2023   Recent Labs    05/25/23 0231 05/25/23 1600 05/26/23 0614 05/27/23 0341 05/28/23 0825  NA 134*   < > 135 134* 134*  K 4.0   < > 3.5 3.0* 3.5  CL 94*   < > 95* 98 96*  CO2 17*   < > 21* 21* 26  GLUCOSE 102*   < > 87 129* 122*  BUN 87*   < > 65* 84* 60*  CREATININE 14.29*   < > 10.10* 11.79* 9.33*  CALCIUM 7.6*   < > 8.6* 8.7* 8.6*  GFRNONAA 4*   < > 6* 5* 6*  PROT 6.5  --  6.0* 6.1*  --   ALBUMIN 2.9*  --  2.4* 2.4*  --   AST 104*  --  53* 39  --   ALT 160*  --  98* 73*  --   ALKPHOS 111  --  129* 153*  --   BILITOT 3.4*  --  4.1* 4.7*  --    < > =  values in this interval not displayed.    RADIOGRAPHIC STUDIES: I have reviewed his prior CT imaging and MRI I have personally reviewed the radiological images as listed and agreed with the findings in the report. US Abdomen Limited RUQ (LIVER/GB)  Result Date: 05/25/2023 CLINICAL DATA:  Transaminitis EXAM: ULTRASOUND ABDOMEN LIMITED RIGHT UPPER QUADRANT COMPARISON:  CT abdomen pelvis 10/21/2022 FINDINGS: Gallbladder: Status post cholecystectomy Common bile duct: Diameter: 4 mm Liver:  Parenchymal echogenicity: Within normal limits Contours: Subtle nodularity Lesions: None Portal vein: Patent.  Hepatopetal flow Other: None. IMPRESSION: Subtle nodularity of hepatic contour suspicious for cirrhosis. No suspicious liver lesions. Electronically Signed   By: Acquanetta Belling M.D.   On: 05/25/2023 10:10   CT CHEST WO CONTRAST  Result Date: 05/24/2023 CLINICAL DATA:  Lung nodules on abdominal CT. EXAM: CT CHEST WITHOUT CONTRAST TECHNIQUE: Multidetector CT imaging of the chest was performed following the standard protocol without IV contrast. RADIATION DOSE REDUCTION: This exam was performed according to the departmental dose-optimization program which includes automated exposure control, adjustment of the mA and/or kV according to patient size and/or use of iterative reconstruction technique. COMPARISON:  Lung bases from abdominopelvic CT yesterday no prior chest CT available. FINDINGS: Cardiovascular: The heart is upper normal in size. No pericardial effusion. There are coronary artery calcifications. Normal caliber thoracic aorta. Mediastinum/Nodes: No mediastinal adenopathy. Prominent right axillary nodes measuring up to 12 mm. There is also an 11 mm left axillary node. Limited hilar assessment in the absence of IV contrast. No esophageal wall thickening. Lungs/Pleura: Bilateral pulmonary nodules, many of which are subpleural in location. The largest nodule is in the subpleural left lower lobe measuring 7 x 7 mm, series 4, image 58. There is mild adjacent ground-glass. There is also a benign calcified granuloma in the left lower lobe. No pleural fluid. Trachea and central airways are clear. No features of pulmonary edema. Upper Abdomen: Assessed on abdominopelvic CT yesterday. No acute findings. Musculoskeletal: Mild diffuse thoracic spondylosis with spurring and scattered Schmorl's nodes. There are no acute or suspicious osseous abnormalities. IMPRESSION: 1. Bilateral pulmonary nodules, many of which  are subpleural in location. The largest nodule is in the subpleural left lower lobe measuring 7 x 7 mm. There is mild adjacent ground-glass. These are nonspecific, and may be infectious or inflammatory. Per Fleischner Society Guidelines, recommend a non-contrast Chest CT at 3-6 months, then consider another non-contrast Chest CT at 18-24 months. If patient is low risk for malignancy, non-contrast Chest CT at 18-24 months is optional. These guidelines do not apply to immunocompromised patients and patients with cancer. Follow up in patients with significant comorbidities as clinically warranted. For lung cancer screening, adhere to Lung-RADS guidelines. Reference: Radiology. 2017; 284(1):228-43. 2. Prominent right axillary nodes measuring up to 12 mm. There is also an 11 mm left axillary node. These are nonspecific. Recommend attention at follow-up. 3. Coronary artery calcifications. Electronically Signed   By: Narda Rutherford M.D.   On: 05/24/2023 22:34   ECHOCARDIOGRAM COMPLETE  Result Date: 05/24/2023    ECHOCARDIOGRAM REPORT   Patient Name:   OLUSEYI MOUNTEER Date of Exam: 05/24/2023 Medical Rec #:  [id number]       Height:       70.0 in Accession #:    [id number]      Weight:       263.4 lb Date of Birth:  06/05/1971      BSA:          2.347 m Patient Age:  51 years        BP:           97/86 mmHg Patient Gender: M               HR:           111 bpm. Exam Location:  Inpatient Procedure: 2D Echo, Cardiac Doppler, Color Doppler and Intracardiac            Opacification Agent Indications:    A fib  History:        Patient has no prior history of Echocardiogram examinations.                 Arrythmias:Atrial Fibrillation, Signs/Symptoms:septic shock;                 Risk Factors:Hypertension and Dyslipidemia.  Sonographer:    Melissa Morford RDCS (AE, PE) Referring Phys: [id number] SUBRINA SUNDIL IMPRESSIONS  1. Left ventricular ejection fraction, by estimation, is 70 to 75%. The left ventricle has hyperdynamic  function. The left ventricle has no regional wall motion abnormalities. Left ventricular diastolic parameters are consistent with Grade I diastolic dysfunction (impaired relaxation).  2. Right ventricular systolic function is normal. The right ventricular size is normal.  3. The mitral valve is normal in structure. No evidence of mitral valve regurgitation. No evidence of mitral stenosis.  4. The aortic valve is normal in structure. Aortic valve regurgitation is not visualized. No aortic stenosis is present.  5. The inferior vena cava The infferior vena cava is collapsed, consistent with low left atrial pressure. Conclusion(s)/Recommendation(s): The study suggests relative hypovolemia. Rhythm during the study was sinus tachycardia. FINDINGS  Left Ventricle: Left ventricular ejection fraction, by estimation, is 70 to 75%. The left ventricle has hyperdynamic function. The left ventricle has no regional wall motion abnormalities. The left ventricular internal cavity size was normal in size. There is no left ventricular hypertrophy. Left ventricular diastolic parameters are consistent with Grade I diastolic dysfunction (impaired relaxation). Right Ventricle: The right ventricular size is normal. No increase in right ventricular wall thickness. Right ventricular systolic function is normal. Left Atrium: Left atrial size was normal in size. Right Atrium: Right atrial size was normal in size. Pericardium: There is no evidence of pericardial effusion. Mitral Valve: The mitral valve is normal in structure. No evidence of mitral valve regurgitation. No evidence of mitral valve stenosis. Tricuspid Valve: The tricuspid valve is normal in structure. Tricuspid valve regurgitation is not demonstrated. No evidence of tricuspid stenosis. Aortic Valve: The aortic valve is normal in structure. Aortic valve regurgitation is not visualized. No aortic stenosis is present. Pulmonic Valve: The pulmonic valve was normal in structure.  Pulmonic valve regurgitation is not visualized. No evidence of pulmonic stenosis. Aorta: The aortic root is normal in size and structure. Venous: The inferior vena cava The infferior vena cava is collapsed, consistent with low left atrial pressure. IAS/Shunts: No atrial level shunt detected by color flow Doppler.  LEFT VENTRICLE PLAX 2D LVIDd:         4.70 cm   Diastology LVIDs:         3.60 cm   LV e' medial:    8.38 cm/s LV PW:         1.10 cm   LV E/e' medial:  6.5 LV IVS:        1.00 cm   LV e' lateral:   11.40 cm/s LVOT diam:     2.20 cm   LV E/e' lateral: 4.8  LV SV:         85 LV SV Index:   36 LVOT Area:     3.80 cm  RIGHT VENTRICLE RV S prime:     11.30 cm/s TAPSE (M-mode): 1.7 cm LEFT ATRIUM             Index        RIGHT ATRIUM           Index LA diam:        4.00 cm 1.70 cm/m   RA Area:     15.00 cm LA Vol (A2C):   67.1 ml 28.59 ml/m  RA Volume:   38.10 ml  16.24 ml/m LA Vol (A4C):   61.4 ml 26.16 ml/m LA Biplane Vol: 64.3 ml 27.40 ml/m  AORTIC VALVE LVOT Vmax:   110.00 cm/s LVOT Vmean:  84.000 cm/s LVOT VTI:    0.224 m  AORTA Ao Root diam: 3.40 cm MITRAL VALVE MV Area (PHT): 6.32 cm    SHUNTS MV Decel Time: 120 msec    Systemic VTI:  0.22 m MV E velocity: 54.70 cm/s  Systemic Diam: 2.20 cm MV A velocity: 74.60 cm/s MV E/A ratio:  0.73 Mihai Croitoru MD Electronically signed by Thurmon Fair MD Signature Date/Time: 05/24/2023/10:30:49 AM    Final    CT L-SPINE NO CHARGE  Result Date: 05/24/2023 CLINICAL DATA:  Insert Osteomyelitis, lumbar Lumbar 4 to lumbar 5 vertebra septic arthritis EXAM: CT LUMBAR SPINE WITHOUT CONTRAST TECHNIQUE: Multidetector CT imaging of the lumbar spine was performed without intravenous contrast administration. Multiplanar CT image reconstructions were also generated. RADIATION DOSE REDUCTION: This exam was performed according to the departmental dose-optimization program which includes automated exposure control, adjustment of the mA and/or kV according to patient size  and/or use of iterative reconstruction technique. COMPARISON:  CT abdomen and pelvis today and 10/21/2022. FINDINGS: Segmentation: 5 lumbar type vertebrae. Alignment: Normal Vertebrae: No acute fracture or focal pathologic process. Paraspinal and other soft tissues: Normal Disc levels: Disc spaces maintained. Mild right facet arthropathy with erosions on the right at L4-5 as seen on earlier study. This is stable when compared to prior CT from February of this year. This is most compatible with facet arthritis. No evidence of infection. IMPRESSION: No acute bony abnormality. No evidence of discitis or osteomyelitis by CT. Degenerative right facet arthropathy at L4-5 unchanged since prior study. Electronically Signed   By: Charlett Nose M.D.   On: 05/24/2023 00:44   CT ABDOMEN PELVIS WO CONTRAST  Result Date: 05/23/2023 CLINICAL DATA:  Sepsis.  Lower back pain. EXAM: CT ABDOMEN AND PELVIS WITHOUT CONTRAST TECHNIQUE: Multidetector CT imaging of the abdomen and pelvis was performed following the standard protocol without IV contrast. RADIATION DOSE REDUCTION: This exam was performed according to the departmental dose-optimization program which includes automated exposure control, adjustment of the mA and/or kV according to patient size and/or use of iterative reconstruction technique. COMPARISON:  MRI abdomen 10/22/2022 and CT abdomen and pelvis 10/21/2022 FINDINGS: Lower chest: Multiple new pulmonary nodules are present in the lung bases. For example in the left lower lobe posteriorly on series 3/image 17 measuring 7 mm. Additional nodules are present in the right lower lobe on 3/10 and 3/7 and right middle lobe on 3/5. There is mild ground-glass opacity about each nodule. Hepatobiliary: Cholecystectomy. Unremarkable noncontrast appearance of the liver. No biliary dilation. Pancreas: Unremarkable. Spleen: Unremarkable. Adrenals/Urinary Tract: Stable adrenal glands. No urinary calculi or hydronephrosis. Nondistended  thick-walled bladder. Stomach/Bowel: Normal caliber large  and small bowel. No bowel wall thickening. The appendix is normal.Stomach is within normal limits. Vascular/Lymphatic: Aortic atherosclerosis. No enlarged abdominal or pelvic lymph nodes. Reproductive: Unremarkable. Other: No free intraperitoneal fluid or air. Musculoskeletal: No acute fracture. Right L4-L5 facet arthritis with mild erosive change. IMPRESSION: 1. Right L4-L5 facet joint arthritis with mild erosive change. Correlation with site of pain is recommended. If there is concern for septic arthritis, MRI is recommended. 2. Multiple pulmonary nodules are present bilaterally measuring up to 7 mm with surrounding ground-glass opacity. This is nonspecific and can be seen in a variety of infectious, neoplastic, and inflammatory conditions including septic emboli. Consider dedicated CT chest for further evaluation. 3. Nondistended thick-walled bladder. Correlate with urinalysis. Aortic Atherosclerosis (ICD10-I70.0). Electronically Signed   By: Minerva Fester M.D.   On: 05/23/2023 23:20   DG Chest Port 1 View  Result Date: 05/23/2023 CLINICAL DATA:  Questionable sepsis - evaluate for abnormality EXAM: PORTABLE CHEST - 1 VIEW COMPARISON:  11/19/2013 FINDINGS: Low lung volumes. Mild perihilar and bibasilar interstitial prominence. No confluent airspace disease. Heart size and mediastinal contours are within normal limits. No effusion. Visualized bones unremarkable. IMPRESSION: Low lung volumes with mild perihilar and bibasilar interstitial prominence. Electronically Signed   By: Corlis Leak M.D.   On: 05/23/2023 21:32

## 2023-06-20 NOTE — Assessment & Plan Note (Signed)
We will check his CBC before each phlebotomy session and we will omit phlebotomy if his hemoglobin is under 9 g

## 2023-06-20 NOTE — Assessment & Plan Note (Signed)
These are nonspecific Plan to repeat imaging again at the end of the year

## 2023-06-20 NOTE — Assessment & Plan Note (Signed)
The patient has signs of iron overload with very high ferritin level and signs of iron deposition from his MRI in February 2024 He has not received intravenous iron from dialysis for over a year I recommend the patient to make sure that he is not taking any vitamin supplement that contains iron I will order tests to look for hemochromatosis  I recommend phlebotomy every 3 weeks or so in an attempt to bring his ferritin level down The limiting factor would be his anemia chronic kidney failure; he might need more frequent doses of erythropoietin stimulating agent while undergoing phlebotomy I will only be checking his ferritin level periodically He will proceed with phlebotomy whenever his hemoglobin is greater than 9 I will see him in a few months for further follow-up

## 2023-06-22 ENCOUNTER — Inpatient Hospital Stay: Payer: Medicare Other | Attending: Hematology and Oncology

## 2023-06-22 ENCOUNTER — Inpatient Hospital Stay: Payer: Medicare Other

## 2023-07-13 ENCOUNTER — Other Ambulatory Visit: Payer: Self-pay | Admitting: Hematology and Oncology

## 2023-07-13 ENCOUNTER — Inpatient Hospital Stay: Payer: Medicare Other

## 2023-07-13 ENCOUNTER — Telehealth: Payer: Self-pay

## 2023-07-13 NOTE — Telephone Encounter (Signed)
I canceled all his orders

## 2023-07-13 NOTE — Telephone Encounter (Signed)
This RN called pt to inform him of missed appts today- pt states that he will not be returning to this clinic as he has found another facility. Appts canceled.

## 2023-08-23 ENCOUNTER — Other Ambulatory Visit: Payer: Self-pay | Admitting: Family Medicine

## 2023-08-23 DIAGNOSIS — R918 Other nonspecific abnormal finding of lung field: Secondary | ICD-10-CM

## 2023-09-09 ENCOUNTER — Ambulatory Visit
Admission: RE | Admit: 2023-09-09 | Discharge: 2023-09-09 | Disposition: A | Discharge status: Home / Self Care | Payer: Medicare Other | Source: Ambulatory Visit | Attending: Family Medicine | Admitting: Family Medicine

## 2023-09-09 DIAGNOSIS — R918 Other nonspecific abnormal finding of lung field: Secondary | ICD-10-CM

## 2024-01-16 ENCOUNTER — Encounter (HOSPITAL_COMMUNITY): Admission: RE | Payer: Self-pay | Source: Home / Self Care

## 2024-01-16 ENCOUNTER — Ambulatory Visit (HOSPITAL_COMMUNITY): Admission: RE | Admit: 2024-01-16 | Source: Home / Self Care | Admitting: Nephrology

## 2024-01-16 SURGERY — A/V SHUNT INTERVENTION
Anesthesia: LOCAL

## 2024-03-09 ENCOUNTER — Ambulatory Visit (HOSPITAL_COMMUNITY)
Admission: RE | Admit: 2024-03-09 | Discharge: 2024-03-09 | Disposition: A | Discharge status: Home / Self Care | Attending: Vascular Surgery | Admitting: Vascular Surgery

## 2024-03-09 ENCOUNTER — Other Ambulatory Visit: Payer: Self-pay

## 2024-03-09 ENCOUNTER — Encounter (HOSPITAL_COMMUNITY)
Admission: RE | Disposition: A | Discharge status: Home / Self Care | Payer: Self-pay | Source: Home / Self Care | Attending: Vascular Surgery

## 2024-03-09 DIAGNOSIS — Z992 Dependence on renal dialysis: Secondary | ICD-10-CM | POA: Diagnosis not present

## 2024-03-09 DIAGNOSIS — I120 Hypertensive chronic kidney disease with stage 5 chronic kidney disease or end stage renal disease: Secondary | ICD-10-CM | POA: Diagnosis not present

## 2024-03-09 DIAGNOSIS — T82838A Hemorrhage of vascular prosthetic devices, implants and grafts, initial encounter: Secondary | ICD-10-CM

## 2024-03-09 DIAGNOSIS — Z7722 Contact with and (suspected) exposure to environmental tobacco smoke (acute) (chronic): Secondary | ICD-10-CM | POA: Insufficient documentation

## 2024-03-09 DIAGNOSIS — E1122 Type 2 diabetes mellitus with diabetic chronic kidney disease: Secondary | ICD-10-CM | POA: Diagnosis not present

## 2024-03-09 DIAGNOSIS — Y832 Surgical operation with anastomosis, bypass or graft as the cause of abnormal reaction of the patient, or of later complication, without mention of misadventure at the time of the procedure: Secondary | ICD-10-CM | POA: Diagnosis not present

## 2024-03-09 DIAGNOSIS — Z7985 Long-term (current) use of injectable non-insulin antidiabetic drugs: Secondary | ICD-10-CM | POA: Insufficient documentation

## 2024-03-09 DIAGNOSIS — N186 End stage renal disease: Secondary | ICD-10-CM

## 2024-03-09 HISTORY — PX: A/V SHUNT INTERVENTION: CATH118220

## 2024-03-09 SURGERY — A/V SHUNT INTERVENTION
Anesthesia: LOCAL | Site: Arm Lower | Laterality: Left

## 2024-03-09 MED ORDER — LIDOCAINE HCL (PF) 1 % IJ SOLN
INTRAMUSCULAR | Status: DC | PRN
Start: 2024-03-09 — End: 2024-03-09
  Administered 2024-03-09: 2 mL via SUBCUTANEOUS

## 2024-03-09 MED ORDER — IODIXANOL 320 MG/ML IV SOLN
INTRAVENOUS | Status: DC | PRN
  Administered 2024-03-09: 40 mL via INTRAVENOUS

## 2024-03-09 MED ORDER — SODIUM CHLORIDE 0.9 % IV SOLN
INTRAVENOUS | Status: AC | PRN
  Administered 2024-03-09: 1000 mL via INTRAVENOUS

## 2024-03-09 MED ORDER — LIDOCAINE HCL (PF) 1 % IJ SOLN
INTRAMUSCULAR | Status: AC
Start: 2024-03-09 — End: 2024-03-09
  Filled 2024-03-09: qty 30

## 2024-03-09 SURGICAL SUPPLY — 5 items
KIT MICROPUNCTURE NIT STIFF (SHEATH) IMPLANT
SHEATH PROBE COVER 6X72 (BAG) IMPLANT
STOPCOCK MORSE 400PSI 3WAY (MISCELLANEOUS) IMPLANT
TRAY PV CATH (CUSTOM PROCEDURE TRAY) ×2 IMPLANT
TUBING CIL FLEX 10 FLL-RA (TUBING) IMPLANT

## 2024-03-09 NOTE — Op Note (Signed)
    Patient name: Ray Bradley MRN: [id number] DOB: 1971/04/19 Sex: male  03/09/2024 Pre-operative Diagnosis: Prolonged bleeding left radiocephalic AV fistula Post-operative diagnosis:  Same Surgeon:  Young Hensen, MD Procedure Performed: 1.  Ultrasound-guided access left radiocephalic AV fistula 2.  Left upper extremity fistulogram including central venogram  Indications: 53 year old male with end-stage renal disease using a left radiocephalic AV fistula.  He presents for evaluation of prolonged bleeding after risk-benefits discussed.  Findings: Widely patent left radiocephalic AV fistula with no evidence of peripheral or central stenosis   Procedure:  The patient was identified in the holding area and taken to [facility] lab.  The patient was then placed supine on the table and prepped and draped in the usual sterile fashion.  A time out was called.  We used 1% lidocaine  without epinephrine  to anesthetize the skin over the fistula.  This was then accessed at the wrist under ultrasound guidance with a micro access needle and placed a microwire and micro sheath.  Left upper extremity fistulogram was obtained including central venogram.  Pertinent findings are noted above.  No intervention performed.  4-0 Monocryl placed.     Young Hensen, MD [facility] Office: [phone]

## 2024-03-09 NOTE — H&P (Signed)
 H&P    MRN #:  [id number]  History of Present Illness: This is a 53 y.o. male with end-stage renal disease that presents for left arm fistulogram.  Patient has a left radiocephalic AV fistula placed in 0160.  States he has had high pressures for the last several months  Past Medical History:  Diagnosis Date   Anemia    Asthma    as a child   Chest pain    Chronic kidney disease    dialysis - at home hemodialysis -does 2 days in a row with one day off   COVID    2022 - mild case   History of kidney stones    HTN (hypertension)    Hyperlipidemia    Pneumonia    as a child   Type II or unspecified type diabetes mellitus without mention of complication, not stated as uncontrolled    Wears glasses     Past Surgical History:  Procedure Laterality Date   AV FISTULA PLACEMENT Left 08/24/2019   Procedure: CREATION OF ARTERIOVENOUS (AV) FISTULA  LEFT ARM;  Surgeon: Young Hensen, MD;  Location: [facility] OR;  Service: Vascular;  Laterality: Left;   CARPAL TUNNEL RELEASE Left 12/30/2022   Procedure: LEFT CARPAL TUNNEL RELEASE;  Surgeon: Brunilda Capra, MD;  Location: [facility] OR;  Service: Orthopedics;  Laterality: Left;  30 MIN   CHOLECYSTECTOMY N/A 10/25/2022   Procedure: LAPAROSCOPIC CHOLECYSTECTOMY;  Surgeon: Anda Bamberg, MD;  Location: [facility] OR;  Service: General;  Laterality: N/A;   CIRCUMCISION, NON-NEWBORN  2004   COLONOSCOPY W/ BIOPSIES AND POLYPECTOMY     EYE SURGERY     FOOT SURGERY     IR FLUORO GUIDE CV LINE RIGHT  07/23/2019   IR FLUORO GUIDE CV LINE RIGHT  09/19/2019   IR US  GUIDE VASC ACCESS RIGHT  07/23/2019   MULTIPLE TOOTH EXTRACTIONS      Allergies  Allergen Reactions   Pork-Derived Products Other (See Comments)    Religious beliefs    Prior to Admission medications   Medication Sig Start Date End Date Taking? Authorizing Provider  amLODipine  (NORVASC ) 10 MG tablet Take 1 tablet (10 mg total) by mouth daily. Patient not taking: Reported on 01/12/2024 05/30/23    Elgergawy, Ardia Kraft, MD  aspirin  EC 81 MG tablet Take 81 mg by mouth daily.     [provider]  B Complex-C-Zn-Folic Acid (DIALYVITE 800-ZINC 15) 0.8 MG TABS Take 1 tablet by mouth daily. 05/11/23   [provider]  calcitRIOL  (ROCALTROL ) 0.25 MCG capsule Take 0.25 mcg by mouth daily.    [provider]  citalopram (CELEXA) 10 MG tablet Take 20 mg by mouth daily.    [provider]  heparin  sodium, porcine, 1000 UNIT/ML injection Inject 4,000 Units into the vein See admin instructions. 2 days on, 1 day off. 02/04/20   [provider]  lidocaine  (LIDODERM ) 5 % Place 1 patch onto the skin daily as needed (musculoskeletal pain). Remove & Discard patch within 12 hours or as directed by MD Patient not taking: Reported on 01/12/2024 05/28/23   Elgergawy, Ardia Kraft, MD  losartan  (COZAAR ) 50 MG tablet Take 1 tablet (50 mg total) by mouth daily. 06/02/23   Elgergawy, Ardia Kraft, MD  Methoxy PEG-Epoetin Beta (MIRCERA IJ) Inject into the skin as needed. 09/28/23   [provider]  mupirocin  ointment (BACTROBAN ) 2 % Apply 1 Application topically 4 (four) times a week. 03/19/20   [provider]  Promethazine -Codeine  6.25-10  MG/5ML SOLN Take 2.5 mLs by mouth at bedtime as needed. Patient not taking: Reported on 01/12/2024 06/16/23     rosuvastatin  (CRESTOR ) 5 MG tablet Take 5 mg by mouth daily.    [provider]  Semaglutide (OZEMPIC, 0.25 OR 0.5 MG/DOSE, [place]) Inject 0.5 mg into the skin once. Wednesday    [provider]  SENSIPAR 30 MG tablet Take 30 mg by mouth every Monday, Wednesday, and Friday. 03/30/23   [provider]  sevelamer  carbonate (RENVELA ) 800 MG tablet Take 1 tablet (800 mg total) by mouth 3 (three) times daily with meals. Patient taking differently: Take 2,400 mg by mouth 3 (three) times daily with meals. 05/28/23   Elgergawy, Ardia Kraft, MD  VIAGRA 25 MG tablet Take 25 mg by mouth as needed for erectile dysfunction.  12/07/23   [provider]    Social History   Socioeconomic History   Marital status: Married    Spouse name: Not on file   Number of children: 3   Years of education: Not on file   Highest education level: Not on file  Occupational History   Not on file  Tobacco Use   Smoking status: Never    Passive exposure: Past   Smokeless tobacco: Never  Vaping Use   Vaping status: Never Used  Substance and Sexual Activity   Alcohol use: Not Currently   Drug use: No   Sexual activity: Yes    Birth control/protection: None  Other Topics Concern   Not on file  Social History Narrative   Lives with wife and 3 children.     Social Drivers of Corporate investment banker Strain: Not on file  Food Insecurity: Low Risk  (12/02/2023)   Received from [facility]   Hunger Vital Sign    Within the past 12 months, you worried that your food would run out before you got money to buy more: Never true    Within the past 12 months, the food you bought just didn't last and you didn't have money to get more. : Never true  Transportation Needs: No Transportation Needs (12/02/2023)   Received from [facility]    In the past 12 months, has lack of reliable transportation kept you from medical appointments, meetings, work or from getting things needed for daily living? : No  Physical Activity: Not on file  Stress: Not on file  Social Connections: Not on file  Intimate Partner Violence: Not At Risk (05/26/2023)   Humiliation, Afraid, Rape, and Kick questionnaire    Fear of Current or Ex-Partner: No    Emotionally Abused: No    Physically Abused: No    Sexually Abused: No     Family History  Problem Relation Age of Onset   Colon cancer Father    Diabetes Other        family history   Hypertension Other        family history   Aneurysm Mother        died age 35, brain   Hypertension Mother    Hypertension Sister 53   Hypertension Sister 68   Hypertension Brother  49    ROS: [x]  Positive   [ ]  Negative   [ ]  All sytems reviewed and are negative  Cardiovascular: []  chest pain/pressure []  palpitations []  SOB lying flat []  DOE []  pain in legs while walking []  pain in legs at rest []  pain in legs at night []  non-healing ulcers []   hx of DVT []  swelling in legs  Pulmonary: []  productive cough []  asthma/wheezing []  home O2  Neurologic: []  weakness in []  arms []  legs []  numbness in []  arms []  legs []  hx of CVA []  mini stroke [] difficulty speaking or slurred speech []  temporary loss of vision in one eye []  dizziness  Hematologic: []  hx of cancer []  bleeding problems []  problems with blood clotting easily  Endocrine:   []  diabetes []  thyroid disease  GI []  vomiting blood []  blood in stool  GU: []  CKD/renal failure []  HD--[]  M/W/F or []  T/T/S []  burning with urination []  blood in urine  Psychiatric: []  anxiety []  depression  Musculoskeletal: []  arthritis []  joint pain  Integumentary: []  rashes []  ulcers  Constitutional: []  fever []  chills   Physical Examination  Vitals:   03/09/24 0742  BP: (!) 141/70  Pulse: 74  Resp: 12  Temp: 97.9 F (36.6 C)  SpO2: 100%   There is no height or weight on file to calculate BMI.  General:  WDWN in NAD Gait: Not observed HENT: WNL, normocephalic Pulmonary: normal non-labored breathing Cardiac: regular, without  Murmurs, rubs or gallops Abdomen:  soft, NT/ND Vascular Exam/Pulses: Left radiocephalic fistula with excellent thrill  CBC    Component Value Date/Time   WBC 15.7 (H) 05/28/2023 0825   RBC 3.59 (L) 05/28/2023 0825   HGB 9.8 (L) 05/28/2023 0825   HCT 29.4 (L) 05/28/2023 0825   PLT 103 (L) 05/28/2023 0825   MCV 81.9 05/28/2023 0825   MCH 27.3 05/28/2023 0825   MCHC 33.3 05/28/2023 0825   RDW 15.4 05/28/2023 0825   LYMPHSABS 0.3 (L) 05/23/2023 2155   MONOABS 0.0 (L) 05/23/2023 2155   EOSABS 0.0 05/23/2023 2155   BASOSABS 0.0 05/23/2023 2155     BMET    Component Value Date/Time   NA 134 (L) 05/28/2023 0825   K 3.5 05/28/2023 0825   CL 96 (L) 05/28/2023 0825   CO2 26 05/28/2023 0825   GLUCOSE 122 (H) 05/28/2023 0825   BUN 60 (H) 05/28/2023 0825   CREATININE 9.33 (H) 05/28/2023 0825   CALCIUM  8.6 (L) 05/28/2023 0825   GFRNONAA 6 (L) 05/28/2023 0825    COAGS: Lab Results  Component Value Date   INR 1.2 05/23/2023   INR 1.1 07/23/2019     Non-Invasive Vascular Imaging:      ASSESSMENT/PLAN: This is a 53 y.o. male  with end-stage renal disease that presents for left arm fistulogram.  Patient has a left radiocephalic AV fistula placed in 1191.  Discussed plan for left arm fistulogram with possible intervention including angioplasty and stenting or surgical revision.  Questions answered  Young Hensen, MD [facility] Office: [phone]  Young Hensen

## 2024-03-12 ENCOUNTER — Encounter (HOSPITAL_COMMUNITY): Payer: Self-pay | Admitting: Vascular Surgery

## 2024-03-20 ENCOUNTER — Telehealth: Payer: Self-pay

## 2024-03-20 NOTE — Telephone Encounter (Signed)
 Patient called regarding removal of his stiches.Patient was informed that he has 4-0 monocryl sutures which dissolve and therefore do not require removal. The patient verbalized understanding.

## 2024-08-04 ENCOUNTER — Emergency Department (HOSPITAL_BASED_OUTPATIENT_CLINIC_OR_DEPARTMENT_OTHER)
Admission: EM | Admit: 2024-08-04 | Discharge: 2024-08-04 | Disposition: A | Discharge status: Home / Self Care | Attending: Emergency Medicine | Admitting: Emergency Medicine

## 2024-08-04 ENCOUNTER — Emergency Department (HOSPITAL_BASED_OUTPATIENT_CLINIC_OR_DEPARTMENT_OTHER): Admitting: Radiology

## 2024-08-04 ENCOUNTER — Other Ambulatory Visit: Payer: Self-pay

## 2024-08-04 DIAGNOSIS — J45909 Unspecified asthma, uncomplicated: Secondary | ICD-10-CM | POA: Insufficient documentation

## 2024-08-04 DIAGNOSIS — I120 Hypertensive chronic kidney disease with stage 5 chronic kidney disease or end stage renal disease: Secondary | ICD-10-CM | POA: Diagnosis not present

## 2024-08-04 DIAGNOSIS — Z8616 Personal history of COVID-19: Secondary | ICD-10-CM | POA: Insufficient documentation

## 2024-08-04 DIAGNOSIS — E1122 Type 2 diabetes mellitus with diabetic chronic kidney disease: Secondary | ICD-10-CM | POA: Diagnosis not present

## 2024-08-04 DIAGNOSIS — Z7951 Long term (current) use of inhaled steroids: Secondary | ICD-10-CM | POA: Insufficient documentation

## 2024-08-04 DIAGNOSIS — N186 End stage renal disease: Secondary | ICD-10-CM | POA: Diagnosis not present

## 2024-08-04 DIAGNOSIS — Z7982 Long term (current) use of aspirin: Secondary | ICD-10-CM | POA: Insufficient documentation

## 2024-08-04 DIAGNOSIS — Z992 Dependence on renal dialysis: Secondary | ICD-10-CM | POA: Insufficient documentation

## 2024-08-04 DIAGNOSIS — Z79899 Other long term (current) drug therapy: Secondary | ICD-10-CM | POA: Insufficient documentation

## 2024-08-04 DIAGNOSIS — R059 Cough, unspecified: Secondary | ICD-10-CM | POA: Diagnosis present

## 2024-08-04 DIAGNOSIS — J069 Acute upper respiratory infection, unspecified: Secondary | ICD-10-CM | POA: Insufficient documentation

## 2024-08-04 LAB — CBC
HCT: 29.3 % — ABNORMAL LOW (ref 39.0–52.0)
Hemoglobin: 10.2 g/dL — ABNORMAL LOW (ref 13.0–17.0)
MCH: 28.3 pg (ref 26.0–34.0)
MCHC: 34.8 g/dL (ref 30.0–36.0)
MCV: 81.2 fL (ref 80.0–100.0)
Platelets: 269 K/uL (ref 150–400)
RBC: 3.61 MIL/uL — ABNORMAL LOW (ref 4.22–5.81)
RDW: 16.6 % — ABNORMAL HIGH (ref 11.5–15.5)
WBC: 7 K/uL (ref 4.0–10.5)
nRBC: 0 % (ref 0.0–0.2)

## 2024-08-04 LAB — RESP PANEL BY RT-PCR (RSV, FLU A&B, COVID)  RVPGX2
Influenza A by PCR: NEGATIVE
Influenza B by PCR: NEGATIVE
Resp Syncytial Virus by PCR: NEGATIVE
SARS Coronavirus 2 by RT PCR: NEGATIVE

## 2024-08-04 LAB — BASIC METABOLIC PANEL WITH GFR
Anion gap: 15 (ref 5–15)
BUN: 26 mg/dL — ABNORMAL HIGH (ref 6–20)
CO2: 29 mmol/L (ref 22–32)
Calcium: 10.3 mg/dL (ref 8.9–10.3)
Chloride: 92 mmol/L — ABNORMAL LOW (ref 98–111)
Creatinine, Ser: 6.52 mg/dL — ABNORMAL HIGH (ref 0.61–1.24)
GFR, Estimated: 10 mL/min — ABNORMAL LOW (ref >60)
Glucose, Bld: 157 mg/dL — ABNORMAL HIGH (ref 70–99)
Potassium: 3.4 mmol/L — ABNORMAL LOW (ref 3.5–5.1)
Sodium: 135 mmol/L (ref 135–145)

## 2024-08-04 MED ORDER — PROMETHAZINE-DM 6.25-15 MG/5ML PO SYRP: 5.0000 mL | ORAL_SOLUTION | Freq: Four times a day (QID) | ORAL | 0 refills | Status: AC | PRN

## 2024-08-04 MED ORDER — ALBUTEROL SULFATE HFA 108 (90 BASE) MCG/ACT IN AERS
2.0000 | INHALATION_SPRAY | Freq: Once | RESPIRATORY_TRACT | Status: AC
  Administered 2024-08-04: 2 via RESPIRATORY_TRACT
  Filled 2024-08-04: qty 6.7

## 2024-08-04 MED ORDER — DEXAMETHASONE 4 MG PO TABS
10.0000 mg | ORAL_TABLET | Freq: Once | ORAL | Status: AC
  Administered 2024-08-04: 10 mg via ORAL
  Filled 2024-08-04: qty 3

## 2024-08-04 NOTE — ED Provider Notes (Signed)
  EMERGENCY DEPARTMENT AT [facility] Provider Note   [name]: [id number] Arrival date & time: 08/04/24  9964     Patient presents with: Cough   Ray Bradley is a 53 y.o. male.   HPI     This is a 53 year old male with a history of end-stage renal disease on home dialysis who presents with cough.  Reports upper respiratory symptoms and nonproductive cough for last 1 week.  Reports coughing fits and subsequent episodes of loss of consciousness.  This has happened to him before in the setting of coughing.  No chest pain or shortness of breath.  States he otherwise feels well.  He is unable to sleep secondary to cough.  Does have a history of asthma as a child.  Prior to Admission medications   Medication Sig Start Date End Date Taking? Authorizing Provider  promethazine -dextromethorphan (PROMETHAZINE -DM) 6.25-15 MG/5ML syrup Take 5 mLs by mouth 4 (four) times daily as needed for cough. 08/04/24  Yes Krisalyn Yankowski, Charmaine FALCON, MD  amLODipine  (NORVASC ) 10 MG tablet Take 1 tablet (10 mg total) by mouth daily. Patient not taking: Reported on 01/12/2024 05/30/23   Elgergawy, Brayton RAMAN, MD  aspirin  EC 81 MG tablet Take 81 mg by mouth daily.     [provider]  B Complex-C-Zn-Folic Acid (DIALYVITE 800-ZINC 15) 0.8 MG TABS Take 1 tablet by mouth daily. 05/11/23   [provider]  calcitRIOL  (ROCALTROL ) 0.25 MCG capsule Take 0.25 mcg by mouth daily.    [provider]  citalopram (CELEXA) 10 MG tablet Take 20 mg by mouth daily.    [provider]  heparin  sodium, porcine, 1000 UNIT/ML injection Inject 4,000 Units into the vein See admin instructions. 2 days on, 1 day off. 02/04/20   [provider]  lidocaine  (LIDODERM ) 5 % Place 1 patch onto the skin daily as needed (musculoskeletal pain). Remove & Discard patch within 12 hours or as directed by MD Patient not taking: Reported on 01/12/2024 05/28/23   Elgergawy, Brayton RAMAN, MD  losartan  (COZAAR )  50 MG tablet Take 1 tablet (50 mg total) by mouth daily. 06/02/23   Elgergawy, Brayton RAMAN, MD  Methoxy PEG-Epoetin Beta (MIRCERA IJ) Inject into the skin as needed. 09/28/23   [provider]  mupirocin  ointment (BACTROBAN ) 2 % Apply 1 Application topically 4 (four) times a week. 03/19/20   [provider]  Promethazine -Codeine  6.25-10 MG/5ML SOLN Take 2.5 mLs by mouth at bedtime as needed. Patient not taking: Reported on 01/12/2024 06/16/23     rosuvastatin  (CRESTOR ) 5 MG tablet Take 5 mg by mouth daily.    [provider]  Semaglutide (OZEMPIC, 0.25 OR 0.5 MG/DOSE, [place]) Inject 0.5 mg into the skin once. Wednesday    [provider]  SENSIPAR 30 MG tablet Take 30 mg by mouth every Monday, Wednesday, and Friday. 03/30/23   [provider]  sevelamer  carbonate (RENVELA ) 800 MG tablet Take 1 tablet (800 mg total) by mouth 3 (three) times daily with meals. Patient taking differently: Take 2,400 mg by mouth 3 (three) times daily with meals. 05/28/23   Elgergawy, Brayton RAMAN, MD  VIAGRA 25 MG tablet Take 25 mg by mouth as needed for erectile dysfunction. 12/07/23   [provider]    Allergies: Porcine (pork) protein-containing drug products    Review of Systems  Constitutional:  Negative for fever.  HENT:  Negative for congestion.   Respiratory:  Positive for cough. Negative for shortness of breath.   Cardiovascular:  Negative for chest pain.  Gastrointestinal:  Negative for abdominal pain.  All other systems reviewed and are negative.   Updated Vital Signs BP 138/78 (BP Location: Right Arm)   Pulse 73   Temp 98.1 F (36.7 C) (Oral)   Resp 18   Ht 1.778 m (5' 10)   Wt 117.9 kg   SpO2 99%   BMI 37.31 kg/m   Physical Exam Vitals and nursing note reviewed.  Constitutional:      Appearance: He is well-developed. He is obese. He is not ill-appearing.  HENT:     Head: Normocephalic and atraumatic.  Eyes:     Pupils: Pupils are equal, round, and  reactive to light.  Cardiovascular:     Rate and Rhythm: Normal rate and regular rhythm.     Heart sounds: Normal heart sounds. No murmur heard. Pulmonary:     Effort: Pulmonary effort is normal. No respiratory distress.     Breath sounds: Wheezing present.     Comments: Occasional wheeze Abdominal:     General: Bowel sounds are normal.     Palpations: Abdomen is soft.     Tenderness: There is no abdominal tenderness. There is no rebound.  Musculoskeletal:     Cervical back: Neck supple.  Skin:    General: Skin is warm and dry.  Neurological:     Mental Status: He is alert and oriented to person, place, and time.  Psychiatric:        Mood and Affect: Mood normal.     (all labs ordered are listed, but only abnormal results are displayed) Labs Reviewed  BASIC METABOLIC PANEL WITH GFR - Abnormal; Notable for the following components:      Result Value   Potassium 3.4 (*)    Chloride 92 (*)    Glucose, Bld 157 (*)    BUN 26 (*)    Creatinine, Ser 6.52 (*)    GFR, Estimated 10 (*)    All other components within normal limits  CBC - Abnormal; Notable for the following components:   RBC 3.61 (*)    Hemoglobin 10.2 (*)    HCT 29.3 (*)    RDW 16.6 (*)    All other components within normal limits  RESP PANEL BY RT-PCR (RSV, FLU A&B, COVID)  [id number]    EKG: EKG Interpretation Date/Time:  Saturday August 04 2024 00:47:32 EST Ventricular Rate:  93 PR Interval:  192 QRS Duration:  100 QT Interval:  362 QTC Calculation: 450 R Axis:   41  Text Interpretation: Normal sinus rhythm ST & T wave abnormality, consider inferior ischemia Abnormal ECG When compared with ECG of 25-May-2023 09:58, ST now depressed in Inferior leads Inverted T waves have replaced nonspecific T wave abnormality in Inferior leads Nonspecific T wave abnormality now evident in Lateral leads Confirmed by Bari Pfeiffer ([id number]) on 08/04/2024 2:55:35 AM  Radiology: [facility] Chest 2 View Result Date:  08/04/2024 EXAM: 2 VIEW(S) XRAY OF THE CHEST 08/04/2024 02:15:16 AM COMPARISON: None available. CLINICAL HISTORY: SOB FINDINGS: LUNGS AND PLEURA: No focal pulmonary opacity. No pleural effusion. No pneumothorax. HEART AND MEDIASTINUM: No acute abnormality of the cardiac and mediastinal silhouettes. BONES AND SOFT TISSUES: No acute osseous abnormality. IMPRESSION: 1. No acute process. Electronically signed by: Dorethia Molt MD 08/04/2024 02:35 AM EST RP Workstation: [id number]     Procedures   Medications Ordered in the ED  albuterol (VENTOLIN HFA) 108 (90 Base) MCG/ACT inhaler 2 puff (2 puffs Inhalation Given 08/04/24 0332)  dexamethasone  (  DECADRON ) tablet 10 mg (10 mg Oral Given 08/04/24 0432)                                    Medical Decision Making Amount and/or Complexity of Data Reviewed Labs: ordered. Radiology: ordered.  Risk Prescription drug management.   This patient presents to the ED for concern of cough, this involves an extensive number of treatment options, and is a complaint that carries with it a high risk of complications and morbidity.  I considered the following differential and admission for this acute, potentially life threatening condition.  The differential diagnosis includes upper respiratory viral illness such as COVID or influenza, pneumonia, reflux  MDM:    This is a 53 year old male who presents with cough.  He is nontoxic and vital signs are reassuring.  Notes that after significant forceful coughing episodes he will often lose consciousness.  This is not new for him.  However, the fits of coughing are keeping him up at night.  EKG shows no evidence of acute arrhythmia or ischemia.  X-ray does not show any evidence of pneumonia.  COVID and flu testing is negative.  Given his history of asthma as a child he was given Decadron  and an inhaler with some improvement of his cough.  Suspect his episodes of loss of consciousness may be cough related/vagal.  He was  on the monitor and did not have any arrhythmias but did have 1 episode of coughing and loss of consciousness per his family.  Upon evaluation by nursing after this episode he was awake and alert.  Doubt seizure.  Will discharge home with an inhaler and dextromethorphan.  (Labs, imaging, consults)  Labs: I Ordered, and personally interpreted labs.  The pertinent results include: CBC, BMP, COVID, influenza  Imaging Studies ordered: I ordered imaging studies including chest x-ray I independently visualized and interpreted imaging. I agree with the radiologist interpretation  Additional history obtained from chart review.  External records from outside source obtained and reviewed including prior evaluations  Cardiac Monitoring: The patient was maintained on a cardiac monitor.  If on the cardiac monitor, I personally viewed and interpreted the cardiac monitored which showed an underlying rhythm of: Sinus  Reevaluation: After the interventions noted above, I reevaluated the patient and found that they have :improved  Social Determinants of Health:  lives independently  Disposition: Discharge  Co morbidities that complicate the patient evaluation  Past Medical History:  Diagnosis Date   Anemia    Asthma    as a child   Chest pain    Chronic kidney disease    dialysis - at home hemodialysis -does 2 days in a row with one day off   COVID    2022 - mild case   History of kidney stones    HTN (hypertension)    Hyperlipidemia    Pneumonia    as a child   Type II or unspecified type diabetes mellitus without mention of complication, not stated as uncontrolled    Wears glasses      Medicines Meds ordered this encounter  Medications   albuterol (VENTOLIN HFA) 108 (90 Base) MCG/ACT inhaler 2 puff   dexamethasone  (DECADRON ) tablet 10 mg   promethazine -dextromethorphan (PROMETHAZINE -DM) 6.25-15 MG/5ML syrup    Sig: Take 5 mLs by mouth 4 (four) times daily as needed for cough.     Dispense:  118 mL    Refill:  0    I have reviewed the patients home medicines and have made adjustments as needed  Problem List / ED Course: Problem List Items Addressed This Visit   None Visit Diagnoses       Viral URI with cough    -  Primary                Final diagnoses:  Viral URI with cough    ED Discharge Orders          Ordered    promethazine -dextromethorphan (PROMETHAZINE -DM) 6.25-15 MG/5ML syrup  4 times daily PRN        08/04/24 0435               Bari Charmaine FALCON, MD 08/04/24 [phone]

## 2024-08-04 NOTE — Discharge Instructions (Signed)
 You were seen today for coughing.  This is likely viral in nature.  No evidence of pneumonia.  You also have had episodes of loss of consciousness with forceful cough.  This sometimes happens but your work appears reassuring.

## 2024-08-04 NOTE — ED Triage Notes (Signed)
 Pt POV reporting coughing fits x1 week, causing syncopal episodes.

## 2024-08-14 NOTE — Progress Notes (Signed)
 Sent for scheduling in July for maintenance appt which has not been completed.  Sending to scheduler for another attempt in addition sending to nephrology to determine if pt needs to be made inactive until visit and testing are complete.

## 2024-08-21 NOTE — Progress Notes (Signed)
 Well if you call him and he doesn't call you back in a reasonable amt of time.  Let me know. Thanks

## 2024-08-28 NOTE — Progress Notes (Signed)
 Nursing Documentation  Overview  Patient is here today for Desferal tx.  Patient arrived at 0900, VSS, pt has no complaints at this time.  PIV placed to R FA with positive blood return. IV maintained positive blood return throughout infusion. IV removed with no complications, site covered with gauze.   Labs drawn off IV shortly after IV started.   Nursing Assessment   Neurological Assessment Patient is: [x] Alert and oriented x 4 [] Lethargic [] Alert and disoriented [] Sedated [] Other    Mobility Assessment Patient is: [x] No limitations [] Slightly limited [] Very limited [] Completely immobile [] Other    Cardiopulmonary Assessment Patient:  [x] Denies any SOB [x] Denies chest pain [] Complains of SOB upon exertion [] Complains of SOB at rest [] Complains of chest pain [] Lungs clear to auscultation [] Abnormal breath sounds, see progress note  Patient: [x] Skin color is appropriate for race [] Skin color is flushed [] Is cyanotic [] Is diaphoretic [] Capillary refill less than 3 seconds [] Capillary refill more than 3 seconds [] Auscultated heart rate and rhythm is regular [] Auscultated heart rate and/or rhythm is irregular, see progress note  Interventions [] Provider notified [] Education provided [x] No interventions required    Psychosocial Assessment Patients behavior and mood is: [x] Appropriate for situation [] Anxious [] Verbally aggressive [] Physically aggressive [x] Cooperative [] Uncooperative  [] Tearful [x] Calm    Patient Response to Treatment Patient: [x] Tolerated well [] Utilizing effective coping [] Complains of     Discharge Status Gait: [x] Gait unchanged from baseline assessment [] Gait changed from baseline assessment [] Other  Patient: AVS Declined, VSS [x] Discharged home in good condition [] Admitted to Hospital [] Sent back to clinic  Return to clinic: 09/11/24 [x] Self/Ambulatory [x] Driving self [] Not Driving self [] With Family [] Required  Assistance [] Wheelchair  Patient education provided:   [x] Call the clinic with questions or concerns [x] Pt verbalized understanding

## 2024-08-29 NOTE — Progress Notes (Signed)
 Orders placed and sent  to scheduler.

## 2024-08-29 NOTE — Telephone Encounter (Signed)
 Called patient to schedule him for a maintenance evaluation.

## 2024-09-18 ENCOUNTER — Other Ambulatory Visit: Payer: Self-pay

## 2024-09-18 ENCOUNTER — Ambulatory Visit: Admission: EM | Admit: 2024-09-18 | Discharge: 2024-09-18 | Disposition: A | Discharge status: Home / Self Care

## 2024-09-18 ENCOUNTER — Encounter: Payer: Self-pay | Admitting: Emergency Medicine

## 2024-09-18 DIAGNOSIS — S60322A Blister (nonthermal) of left thumb, initial encounter: Secondary | ICD-10-CM | POA: Diagnosis not present

## 2024-09-18 NOTE — Discharge Instructions (Signed)
 Keep the blister clean and dry; may apply mupirocin  ointment for any observable redness, tenderness, or swelling.   Avoid submerging his hand in water; may still shower and wash hands

## 2024-09-18 NOTE — ED Provider Notes (Signed)
 " [name]    [name]: [id number] Arrival date & time: 09/18/24  1214      History   Chief Complaint Chief Complaint  Patient presents with   Blister    HPI Ray Bradley is a 53 y.o. male.   Patient presents requesting evaluation for a blister that he sustained to his left hand while wearing a pair of thermal gloves.  Injury occurred approximately 3-4 days ago.  States he felt the heat when applied to his hands but it was too late due to delayed sensitivity in his hands.  There is no erythema or warmth but he has noted a serous blister growing over the IP joint which prompted him to come in for evaluation.  He has an extensive medical history including HTN, home hemodialysis, and Type 2 DM.  Patient is right hand dominant.  The history is provided by the patient.    Past Medical History:  Diagnosis Date   Anemia    Asthma    as a child   Chest pain    Chronic kidney disease    dialysis - at home hemodialysis -does 2 days in a row with one day off   COVID    2022 - mild case   History of kidney stones    HTN (hypertension)    Hyperlipidemia    Pneumonia    as a child   Type II or unspecified type diabetes mellitus without mention of complication, not stated as uncontrolled    Wears glasses     Patient Active Problem List   Diagnosis Date Noted   Iron  overload 06/20/2023   Multiple lung nodules on CT 06/20/2023   Septic shock (HCC) 05/24/2023   Leukopenia 05/24/2023   Paroxysmal atrial fibrillation (HCC) 05/24/2023   Sepsis (HCC) 05/24/2023   History of laparoscopic cholecystectomy 10/25/2022   End-stage renal disease on hemodialysis (HCC) 10/22/2022   Essential hypertension 10/22/2022   Dyslipidemia 10/22/2022   Type 2 diabetes mellitus with chronic kidney disease, without long-term current use of insulin  (HCC) 10/22/2022   Elevated LFTs 10/22/2022   Symptomatic cholelithiasis 10/21/2022   Dysthymia 08/11/2021   Hyperglycemia due to type 2  diabetes mellitus (HCC) 08/11/2021   Hypertensive retinopathy 08/11/2021   History of colonic polyps 08/11/2021   Allergy, unspecified, initial encounter 04/24/2021   Mixed dyslipidemia 04/06/2021   Mild protein-calorie malnutrition 03/31/2021   Hypercalcemia 07/29/2020   Hyperkalemia 05/28/2020   Anaphylactic shock, unspecified, initial encounter 03/31/2020   Class 2 obesity in adult 03/13/2020   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) 02/27/2020   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, subsequent encounter 01/04/2020   Long term (current) use of anticoagulants 12/14/2019   Other abnormal findings in urine 12/13/2019   Hyperlipidemia 12/13/2019   Other specified diseases of liver 12/13/2019   Other disorders of phosphorus metabolism 12/05/2019   Fluid overload, unspecified 12/05/2019   ESRD (end stage renal disease) (HCC) 10/09/2019   Underimmunization status 08/07/2019   Anemia in chronic kidney disease 07/25/2019   Coagulation defect, unspecified 07/25/2019   Complication of vascular dialysis catheter 07/25/2019   Diarrhea, unspecified 07/25/2019   Type 2 diabetes mellitus with diabetic neuropathy, unspecified (HCC) 07/25/2019   Dependence on renal dialysis 07/25/2019   Iron  deficiency anemia, unspecified 07/25/2019   Pain, unspecified 07/25/2019   Encounter for immunization 07/25/2019   Pruritus, unspecified 07/25/2019   Secondary hyperparathyroidism of renal origin 07/25/2019   Shortness of breath 07/25/2019   Type  2 diabetes mellitus with unspecified diabetic retinopathy without macular edema (HCC) 07/25/2019   CKD (chronic kidney disease) stage 5, GFR less than 15 ml/min (HCC) 10/17/2018   HTN (hypertension)    Diabetes (HCC)    Chest pain     Past Surgical History:  Procedure Laterality Date   A/V SHUNT INTERVENTION Left 03/09/2024   Procedure: A/V SHUNT INTERVENTION;  Surgeon: Gretta Lonni PARAS, MD;  Location: [facility];  Service: Cardiovascular;  Laterality: Left;   AV FISTULA PLACEMENT Left 08/24/2019   Procedure: CREATION OF ARTERIOVENOUS (AV) FISTULA  LEFT ARM;  Surgeon: Gretta Lonni PARAS, MD;  Location: [facility];  Service: Vascular;  Laterality: Left;   CARPAL TUNNEL RELEASE Left 12/30/2022   Procedure: LEFT CARPAL TUNNEL RELEASE;  Surgeon: Murrell Drivers, MD;  Location: [facility];  Service: Orthopedics;  Laterality: Left;  30 MIN   CHOLECYSTECTOMY N/A 10/25/2022   Procedure: LAPAROSCOPIC CHOLECYSTECTOMY;  Surgeon: Paola Dreama SAILOR, MD;  Location: [facility];  Service: General;  Laterality: N/A;   CIRCUMCISION, NON-NEWBORN  2004   COLONOSCOPY W/ BIOPSIES AND POLYPECTOMY     EYE SURGERY     FOOT SURGERY     IR FLUORO GUIDE CV LINE RIGHT  07/23/2019   IR FLUORO GUIDE CV LINE RIGHT  09/19/2019   IR US  GUIDE VASC ACCESS RIGHT  07/23/2019   MULTIPLE TOOTH EXTRACTIONS         Home Medications    Prior to Admission medications  Medication Sig Start Date End Date Taking? Authorizing Provider  amLODipine  (NORVASC ) 10 MG tablet Take 1 tablet (10 mg total) by mouth daily. Patient not taking: Reported on 01/12/2024 05/30/23   Elgergawy, Brayton RAMAN, MD  aspirin  EC 81 MG tablet Take 81 mg by mouth daily.     [provider]  B Complex-C-Zn-Folic Acid (DIALYVITE 800-ZINC 15) 0.8 MG TABS Take 1 tablet by mouth daily. 05/11/23   [provider]  calcitRIOL  (ROCALTROL ) 0.25 MCG capsule Take 0.25 mcg by mouth daily.    [provider]  citalopram (CELEXA) 10 MG tablet Take 20 mg by mouth daily.    [provider]  heparin  sodium, porcine, 1000 UNIT/ML injection Inject 4,000 Units into the vein See admin instructions. 2 days on, 1 day off. 02/04/20   [provider]  lidocaine  (LIDODERM ) 5 % Place 1 patch onto the skin daily as needed (musculoskeletal pain). Remove & Discard patch within 12 hours or as directed by MD Patient not taking: Reported on 01/12/2024 05/28/23   Elgergawy, Brayton RAMAN,  MD  losartan  (COZAAR ) 50 MG tablet Take 1 tablet (50 mg total) by mouth daily. 06/02/23   Elgergawy, Brayton RAMAN, MD  Methoxy PEG-Epoetin Beta (MIRCERA IJ) Inject into the skin as needed. 09/28/23   [provider]  mupirocin  ointment (BACTROBAN ) 2 % Apply 1 Application topically 4 (four) times a week. 03/19/20   [provider]  Promethazine -Codeine  6.25-10 MG/5ML SOLN Take 2.5 mLs by mouth at bedtime as needed. Patient not taking: Reported on 01/12/2024 06/16/23     promethazine -dextromethorphan (PROMETHAZINE -DM) 6.25-15 MG/5ML syrup Take 5 mLs by mouth 4 (four) times daily as needed for cough. 08/04/24   Horton, Charmaine FALCON, MD  rosuvastatin  (CRESTOR ) 5 MG tablet Take 5 mg by mouth daily.    [provider]  Semaglutide (OZEMPIC, 0.25 OR 0.5 MG/DOSE, [place]) Inject 0.5 mg into the skin once. Wednesday    [provider]  SENSIPAR 30 MG tablet Take 30 mg by mouth  every Monday, Wednesday, and Friday. 03/30/23   [provider]  sevelamer  carbonate (RENVELA ) 800 MG tablet Take 1 tablet (800 mg total) by mouth 3 (three) times daily with meals. Patient taking differently: Take 2,400 mg by mouth 3 (three) times daily with meals. 05/28/23   Elgergawy, Brayton RAMAN, MD  VIAGRA 25 MG tablet Take 25 mg by mouth as needed for erectile dysfunction. 12/07/23   [provider]    Family History Family History  Problem Relation Age of Onset   Colon cancer Father    Diabetes Other        family history   Hypertension Other        family history   Aneurysm Mother        died age 67, brain   Hypertension Mother    Hypertension Sister 12   Hypertension Sister 47   Hypertension Brother 65    Social History Social History[1]   Allergies   Porcine (pork) protein-containing drug products   Review of Systems Review of Systems  Constitutional:  Negative for chills and fatigue.  HENT:  Negative for congestion, rhinorrhea and sore throat.   Eyes:  Negative for pain  and redness.  Respiratory:  Negative for cough and shortness of breath.   Cardiovascular:  Negative for chest pain.  Gastrointestinal:  Negative for abdominal pain, diarrhea and vomiting.  Genitourinary:  Negative for dysuria.  Musculoskeletal:  Negative for arthralgias and myalgias.  Skin:  Positive for wound ((thermal blister)). Negative for rash.  Neurological:  Negative for dizziness and headaches.  Psychiatric/Behavioral:  Negative for sleep disturbance.      Physical Exam Triage Vital Signs ED Triage Vitals  Encounter Vitals Group     BP 09/18/24 1342 (!) 191/93     Girls Systolic BP Percentile --      Girls Diastolic BP Percentile --      Boys Systolic BP Percentile --      Boys Diastolic BP Percentile --      Pulse Rate 09/18/24 1342 66     Resp 09/18/24 1342 18     Temp 09/18/24 1342 97.9 F (36.6 C)     Temp Source 09/18/24 1342 Oral     SpO2 09/18/24 1342 95 %     Weight --      Height --      Head Circumference --      Peak Flow --      Pain Score 09/18/24 1343 0     Pain Loc --      Pain Education --      Exclude from Growth Chart --    No data found.  Updated Vital Signs BP (!) 191/93 (BP Location: Left Arm)   Pulse 66   Temp 97.9 F (36.6 C) (Oral)   Resp 18   SpO2 95%   Visual Acuity Right Eye Distance:   Left Eye Distance:   Bilateral Distance:    Right Eye Near:   Left Eye Near:    Bilateral Near:     Physical Exam Vitals and nursing note reviewed.  Constitutional:      Appearance: Normal appearance.  HENT:     Head: Normocephalic.  Cardiovascular:     Rate and Rhythm: Normal rate and regular rhythm.     Heart sounds: Normal heart sounds.  Pulmonary:     Effort: Pulmonary effort is normal.     Breath sounds: Normal breath sounds. No wheezing, rhonchi or rales.  Abdominal:  General: Bowel sounds are normal.  Skin:    General: Skin is warm and dry.     Comments: Left Thumb: there is a protuberant serous blister over the  posterior IP joint.  No underlying erythema, warmth.  The blister was easily decompressed, clear fluid expressed, and bandaid applied.  Retains range of motion of the thumb.  Does not involve the remainder of the thumb/hand.    Left Hand: he has several other bandaged injuries related to work to his fingertips.  Does not voice concerns about those today - has been applying Neosporin to treat.  Neurological:     General: No focal deficit present.     Mental Status: He is alert and oriented to person, place, and time.  Psychiatric:        Mood and Affect: Mood normal.        Behavior: Behavior normal.        Thought Content: Thought content normal.        Judgment: Judgment normal.    UC Treatments / Results  Labs (all labs ordered are listed, but only abnormal results are displayed) Labs Reviewed - No data to display  EKG   Radiology No results found.  Procedures Procedures (including critical care time)  Medications Ordered in UC Medications - No data to display  Initial Impression / Assessment and Plan / UC Course  I have reviewed the triage vital signs and the nursing notes.  Pertinent labs & imaging results that were available during my care of the patient were reviewed by me and considered in my medical decision making (see chart for details).    Patient presented for evaluation of a blistered injury to his left thumb that occurred approximately 3-4 days ago.  He was concerned about a blister over the joint.  No signs of infection.  For comfort and stability of injury, the blister was easily decompressed and a bandaid applied.  Patient reports he has access to mupirocin  ointment and will use it topically while this injury heals.  He is aware of red flag symptoms which would warrant return evaluation.  Final Clinical Impressions(s) / UC Diagnoses   Final diagnoses:  Blister of left thumb, initial encounter     Discharge Instructions      Keep the blister clean and  dry; may apply mupirocin  ointment for any observable redness, tenderness, or swelling.   Avoid submerging his hand in water; may still shower and wash hands    ED Prescriptions   None    PDMP not reviewed this encounter.    [1]  Social History Tobacco Use   Smoking status: Never    Passive exposure: Past   Smokeless tobacco: Never  Vaping Use   Vaping status: Never Used  Substance Use Topics   Alcohol use: Not Currently   Drug use: No     Janet Therisa PARAS, FNP 09/18/24 1421  "

## 2024-09-18 NOTE — ED Triage Notes (Signed)
 Pt here with blister to left thumb x 3 days since wearing heating gloves that he thinks may have burned him; pt on home dialysis
